# Patient Record
Sex: Male | Born: 1970 | Race: White | Hispanic: No | Marital: Married | Smoking: Former smoker
Health system: Southern US, Community
[De-identification: ages and names within clinical notes are randomized; demographics above are authoritative.]

## PROBLEM LIST (undated history)

## (undated) DIAGNOSIS — K219 Gastro-esophageal reflux disease without esophagitis: Secondary | ICD-10-CM

## (undated) DIAGNOSIS — F419 Anxiety disorder, unspecified: Secondary | ICD-10-CM

## (undated) DIAGNOSIS — F32A Depression, unspecified: Secondary | ICD-10-CM

## (undated) DIAGNOSIS — D649 Anemia, unspecified: Secondary | ICD-10-CM

## (undated) DIAGNOSIS — G709 Myoneural disorder, unspecified: Secondary | ICD-10-CM

## (undated) DIAGNOSIS — H409 Unspecified glaucoma: Secondary | ICD-10-CM

## (undated) DIAGNOSIS — K625 Hemorrhage of anus and rectum: Secondary | ICD-10-CM

## (undated) HISTORY — PX: EYE SURGERY: SHX253

## (undated) HISTORY — PX: ORTHOPEDIC SURGERY: SHX850

## (undated) HISTORY — PX: FRACTURE SURGERY: SHX138

---

## 2015-04-10 ENCOUNTER — Encounter (INDEPENDENT_AMBULATORY_CARE_PROVIDER_SITE_OTHER): Payer: Self-pay

## 2015-04-10 ENCOUNTER — Encounter (INDEPENDENT_AMBULATORY_CARE_PROVIDER_SITE_OTHER): Payer: Self-pay | Admitting: *Deleted

## 2018-03-14 DIAGNOSIS — H21233 Degeneration of iris (pigmentary), bilateral: Secondary | ICD-10-CM | POA: Diagnosis not present

## 2018-03-14 DIAGNOSIS — Z961 Presence of intraocular lens: Secondary | ICD-10-CM | POA: Diagnosis not present

## 2018-03-14 DIAGNOSIS — H401331 Pigmentary glaucoma, bilateral, mild stage: Secondary | ICD-10-CM | POA: Diagnosis not present

## 2018-05-30 DIAGNOSIS — Z0000 Encounter for general adult medical examination without abnormal findings: Secondary | ICD-10-CM | POA: Diagnosis not present

## 2018-05-30 DIAGNOSIS — H8113 Benign paroxysmal vertigo, bilateral: Secondary | ICD-10-CM | POA: Diagnosis not present

## 2018-05-30 DIAGNOSIS — Z6824 Body mass index (BMI) 24.0-24.9, adult: Secondary | ICD-10-CM | POA: Diagnosis not present

## 2018-05-31 ENCOUNTER — Encounter (INDEPENDENT_AMBULATORY_CARE_PROVIDER_SITE_OTHER): Payer: Self-pay | Admitting: *Deleted

## 2018-06-13 DIAGNOSIS — L03119 Cellulitis of unspecified part of limb: Secondary | ICD-10-CM | POA: Diagnosis not present

## 2018-06-13 DIAGNOSIS — Z6824 Body mass index (BMI) 24.0-24.9, adult: Secondary | ICD-10-CM | POA: Diagnosis not present

## 2018-08-02 DIAGNOSIS — H401331 Pigmentary glaucoma, bilateral, mild stage: Secondary | ICD-10-CM | POA: Diagnosis not present

## 2018-08-02 DIAGNOSIS — H21233 Degeneration of iris (pigmentary), bilateral: Secondary | ICD-10-CM | POA: Diagnosis not present

## 2019-07-27 DIAGNOSIS — L509 Urticaria, unspecified: Secondary | ICD-10-CM | POA: Diagnosis not present

## 2019-08-03 DIAGNOSIS — H401121 Primary open-angle glaucoma, left eye, mild stage: Secondary | ICD-10-CM | POA: Diagnosis not present

## 2019-08-03 DIAGNOSIS — H5212 Myopia, left eye: Secondary | ICD-10-CM | POA: Diagnosis not present

## 2019-08-03 DIAGNOSIS — Z9849 Cataract extraction status, unspecified eye: Secondary | ICD-10-CM | POA: Diagnosis not present

## 2019-08-03 DIAGNOSIS — H524 Presbyopia: Secondary | ICD-10-CM | POA: Diagnosis not present

## 2019-08-03 DIAGNOSIS — H52222 Regular astigmatism, left eye: Secondary | ICD-10-CM | POA: Diagnosis not present

## 2019-08-03 DIAGNOSIS — Z961 Presence of intraocular lens: Secondary | ICD-10-CM | POA: Diagnosis not present

## 2019-08-03 DIAGNOSIS — H401111 Primary open-angle glaucoma, right eye, mild stage: Secondary | ICD-10-CM | POA: Diagnosis not present

## 2019-08-03 DIAGNOSIS — H43813 Vitreous degeneration, bilateral: Secondary | ICD-10-CM | POA: Diagnosis not present

## 2019-10-18 DIAGNOSIS — H401331 Pigmentary glaucoma, bilateral, mild stage: Secondary | ICD-10-CM | POA: Diagnosis not present

## 2019-10-18 DIAGNOSIS — H4063X1 Glaucoma secondary to drugs, bilateral, mild stage: Secondary | ICD-10-CM | POA: Diagnosis not present

## 2020-05-10 DIAGNOSIS — Z20828 Contact with and (suspected) exposure to other viral communicable diseases: Secondary | ICD-10-CM | POA: Diagnosis not present

## 2020-07-09 DIAGNOSIS — Z23 Encounter for immunization: Secondary | ICD-10-CM | POA: Diagnosis not present

## 2020-09-26 DIAGNOSIS — H401331 Pigmentary glaucoma, bilateral, mild stage: Secondary | ICD-10-CM | POA: Diagnosis not present

## 2021-03-27 DIAGNOSIS — H524 Presbyopia: Secondary | ICD-10-CM | POA: Diagnosis not present

## 2021-03-27 DIAGNOSIS — H401111 Primary open-angle glaucoma, right eye, mild stage: Secondary | ICD-10-CM | POA: Diagnosis not present

## 2021-09-11 ENCOUNTER — Emergency Department (HOSPITAL_COMMUNITY)
Admission: EM | Admit: 2021-09-11 | Discharge: 2021-09-11 | Disposition: A | Discharge status: Home / Self Care | Payer: 59 | Attending: Emergency Medicine | Admitting: Emergency Medicine

## 2021-09-11 ENCOUNTER — Emergency Department (HOSPITAL_COMMUNITY): Payer: 59

## 2021-09-11 ENCOUNTER — Encounter (HOSPITAL_COMMUNITY): Payer: Self-pay | Admitting: *Deleted

## 2021-09-11 DIAGNOSIS — R202 Paresthesia of skin: Secondary | ICD-10-CM | POA: Diagnosis not present

## 2021-09-11 DIAGNOSIS — R079 Chest pain, unspecified: Secondary | ICD-10-CM | POA: Diagnosis not present

## 2021-09-11 DIAGNOSIS — J189 Pneumonia, unspecified organism: Secondary | ICD-10-CM | POA: Diagnosis not present

## 2021-09-11 DIAGNOSIS — Z20822 Contact with and (suspected) exposure to covid-19: Secondary | ICD-10-CM | POA: Insufficient documentation

## 2021-09-11 DIAGNOSIS — R200 Anesthesia of skin: Secondary | ICD-10-CM | POA: Diagnosis not present

## 2021-09-11 DIAGNOSIS — R0789 Other chest pain: Secondary | ICD-10-CM | POA: Diagnosis present

## 2021-09-11 DIAGNOSIS — R918 Other nonspecific abnormal finding of lung field: Secondary | ICD-10-CM | POA: Diagnosis not present

## 2021-09-11 LAB — BASIC METABOLIC PANEL
Anion gap: 6 (ref 5–15)
BUN: 13 mg/dL (ref 6–20)
CO2: 27 mmol/L (ref 22–32)
Calcium: 8.8 mg/dL — ABNORMAL LOW (ref 8.9–10.3)
Chloride: 103 mmol/L (ref 98–111)
Creatinine, Ser: 1.16 mg/dL (ref 0.61–1.24)
GFR, Estimated: >60 mL/min (ref >60)
Glucose, Bld: 95 mg/dL (ref 70–99)
Potassium: 3.9 mmol/L (ref 3.5–5.1)
Sodium: 136 mmol/L (ref 135–145)

## 2021-09-11 LAB — CBC
HCT: 45.9 % (ref 39.0–52.0)
Hemoglobin: 16.5 g/dL (ref 13.0–17.0)
MCH: 32.6 pg (ref 26.0–34.0)
MCHC: 35.9 g/dL (ref 30.0–36.0)
MCV: 90.7 fL (ref 80.0–100.0)
Platelets: 179 10*3/uL (ref 150–400)
RBC: 5.06 MIL/uL (ref 4.22–5.81)
RDW: 11.7 % (ref 11.5–15.5)
WBC: 4 10*3/uL (ref 4.0–10.5)
nRBC: 0 % (ref 0.0–0.2)

## 2021-09-11 LAB — RESP PANEL BY RT-PCR (FLU A&B, COVID) ARPGX2
Influenza A by PCR: NEGATIVE
Influenza B by PCR: NEGATIVE
SARS Coronavirus 2 by RT PCR: NEGATIVE

## 2021-09-11 LAB — TROPONIN I (HIGH SENSITIVITY): Troponin I (High Sensitivity): <2 ng/L (ref <18)

## 2021-09-11 MED ORDER — ALUM & MAG HYDROXIDE-SIMETH 200-200-20 MG/5ML PO SUSP
30.0000 mL | Freq: Once | ORAL | Status: AC
  Administered 2021-09-11: 30 mL via ORAL
  Filled 2021-09-11: qty 30

## 2021-09-11 MED ORDER — AZITHROMYCIN 250 MG PO TABS: 250.0000 mg | ORAL_TABLET | Freq: Every day | ORAL | 0 refills | Status: DC

## 2021-09-11 MED ORDER — LIDOCAINE VISCOUS HCL 2 % MT SOLN
15.0000 mL | Freq: Once | OROMUCOSAL | Status: AC
  Administered 2021-09-11: 15 mL via ORAL
  Filled 2021-09-11: qty 15

## 2021-09-11 NOTE — Discharge Instructions (Signed)
Take the antibiotics as prescribed.  Follow-up with your primary care doctor to make sure you are improving and consider repeat chest x-ray

## 2021-09-11 NOTE — ED Provider Triage Note (Signed)
Emergency Medicine Provider Triage Evaluation Note  Troy Dillon , a 51 y.o. male  was evaluated in triage.  Pt complains of chest pain.  Symptoms started as an uncomfortable sensation in his chest last night.  Today he started having discomfort on the left side of his chest that is more painful.  Patient does not have a history of heart disease.  He was tried to make an appointment with his doctor but then while he was driving today he started to feel lightheaded and tingling all over.  No fevers or chills.  No cough..  Review of Systems  Positive: Chest pain Negative: Abdominal pain  Physical Exam  BP (!) 124/96    Pulse 65    Temp 97.9 F (36.6 C) (Oral)    Resp 18    SpO2 99%  Gen:   Awake, no distress   Resp:  Normal effort  MSK:   Moves extremities without difficulty  Other:    Medical Decision Making  Medically screening exam initiated at 11:30 AM.  Appropriate orders placed.  Troy Wyman was informed that the remainder of the evaluation will be completed by another provider, this initial triage assessment does not replace that evaluation, and the importance of remaining in the ED until their evaluation is complete.  Initial EKG without signs of acute STEMI.  We will proceed with laboratory testing x-rays for evaluation   Dorie Rank, MD 09/11/21 1132

## 2021-09-11 NOTE — ED Provider Notes (Signed)
[facility] Provider Note   CSN: [id number] Arrival date & time: 09/11/21  1101     History  Chief Complaint  Patient presents with   Chest Pain    Troy Dillon is a 51 y.o. male.   Chest Pain Associated symptoms: no cough and no fever    HPI: A 51 year old patient presents for evaluation of chest pain. Initial onset of pain was more than 6 hours ago. The patient's chest pain is described as heaviness/pressure/tightness and is not worse with exertion. The patient's chest pain is middle- or left-sided, is not well-localized, is not sharp and does not radiate to the arms/jaw/neck. The patient does not complain of nausea and denies diaphoresis. The patient has no history of stroke, has no history of peripheral artery disease, has not smoked in the past 90 days, denies any history of treated diabetes, has no relevant family history of coronary artery disease (first degree relative at less than age 35), is not hypertensive, has no history of hypercholesterolemia and does not have an elevated BMI (>=30).  Patient states he noticed a pressure in his chest that started last night.  He eventually went to sleep but the symptoms were still there this morning when he woke up.  Later this morning he developed the pressure-like discomfort.  He also had an episode of lightheadedness and tingling while he was driving.  He did notice 1 episode of pain increasing when he yawned. Home Medications Prior to Admission medications   Medication Sig Start Date End Date Taking? Authorizing Provider  azithromycin (ZITHROMAX) 250 MG tablet Take 1 tablet (250 mg total) by mouth daily. Take first 2 tablets together, then 1 every day until finished. 09/11/21  Yes Dorie Rank, MD  Multiple Vitamin (MULTIVITAMIN) tablet Take 1 tablet by mouth daily.   Yes [provider]      Allergies    Hydrocodone    Review of Systems   Review of Systems  Constitutional:  Negative for fever.   Respiratory:  Negative for cough.   Cardiovascular:  Positive for chest pain.   Physical Exam Updated Vital Signs BP 117/87    Pulse 61    Temp 97.9 F (36.6 C) (Oral)    Resp 17    SpO2 99%  Physical Exam Vitals and nursing note reviewed.  Constitutional:      General: He is not in acute distress.    Appearance: He is well-developed.  HENT:     Head: Normocephalic and atraumatic.     Right Ear: External ear normal.     Left Ear: External ear normal.  Eyes:     General: No scleral icterus.       Right eye: No discharge.        Left eye: No discharge.     Conjunctiva/sclera: Conjunctivae normal.  Neck:     Trachea: No tracheal deviation.  Cardiovascular:     Rate and Rhythm: Normal rate and regular rhythm.  Pulmonary:     Effort: Pulmonary effort is normal. No respiratory distress.     Breath sounds: Normal breath sounds. No stridor. No wheezing or rales.  Abdominal:     General: Bowel sounds are normal. There is no distension.     Palpations: Abdomen is soft.     Tenderness: There is no abdominal tenderness. There is no guarding or rebound.  Musculoskeletal:        General: No tenderness or deformity.     Cervical back: Neck supple.  Skin:    General: Skin is warm and dry.     Findings: No rash.  Neurological:     General: No focal deficit present.     Mental Status: He is alert.     Cranial Nerves: No cranial nerve deficit (no facial droop, extraocular movements intact, no slurred speech).     Sensory: No sensory deficit.     Motor: No abnormal muscle tone or seizure activity.     Coordination: Coordination normal.  Psychiatric:        Mood and Affect: Mood normal.    ED Results / Procedures / Treatments   Labs (all labs ordered are listed, but only abnormal results are displayed) Labs Reviewed  BASIC METABOLIC PANEL - Abnormal; Notable for the following components:      Result Value   Calcium 8.8 (*)    All other components within normal limits  RESP PANEL  BY RT-PCR (FLU A&B, COVID) ARPGX2  CBC  TROPONIN I (HIGH SENSITIVITY)    EKG EKG Interpretation  Date/Time:  Thursday September 11 2021 11:14:28 EST Ventricular Rate:  63 PR Interval:  130 QRS Duration: 86 QT Interval:  378 QTC Calculation: 386 R Axis:   58 Text Interpretation: Normal sinus rhythm Normal ECG No previous ECGs available Confirmed by Dorie Rank [phone]) on 09/11/2021 11:25:43 AM  Radiology DG Chest 2 View  Result Date: 09/11/2021 CLINICAL DATA:  Chest pain EXAM: CHEST - 2 VIEW COMPARISON:  None. FINDINGS: The heart size and mediastinal contours are within normal limits. Mild, diffuse bilateral interstitial pulmonary opacity. The visualized skeletal structures are unremarkable. IMPRESSION: Mild, diffuse bilateral interstitial pulmonary opacity, suggestive of edema or atypical/viral infection. No focal airspace opacity. Electronically Signed   By: Delanna Ahmadi M.D.   On: 09/11/2021 11:41    Procedures Procedures    Medications Ordered in ED Medications  alum & mag hydroxide-simeth (MAALOX/MYLANTA) 200-200-20 MG/5ML suspension 30 mL (30 mLs Oral Given 09/11/21 1226)    And  lidocaine (XYLOCAINE) 2 % viscous mouth solution 15 mL (15 mLs Oral Given 09/11/21 1226)    ED Course/ Medical Decision Making/ A&P Clinical Course as of 09/11/21 1533  Thu Sep 11, 2021  1218 Troponin is normal.  CBC and metabolic panel are normal. [JK]  1219 DG Chest 2 View Chest x-ray images and radiology findings reviewed.  Possible interstitial viral process [JK]  1515 COVID and flu are negative. [JK]    Clinical Course User Index [JK] Dorie Rank, MD   HEAR Score: 2                       Medical Decision Making Amount and/or Complexity of Data Reviewed Labs: ordered. Radiology: ordered. Decision-making details documented in ED Course.  Risk OTC drugs. Prescription drug management.   Patient presented with complaints of chest pain.  Low risk heart score.  Serial troponins are normal.   I doubt ACS.  Low risk for PE, symptoms not suggestive of PE ED.  No risk factors.  Chest x-ray shows questionable interstitial pneumonia.  Patient has had some symptoms that increased with breathing.  No fever or and patient has not been coughing so pneumonia is not definitive but we will go ahead and treat with a course of antibiotics.  Recommend outpatient follow-up on x-ray.  Tingling and paresthesias earlier most likely related to the hyperventilation.  No signs of acute electrolyte abnormalities.  No hypoglycemia.  Patient has a normal neurologic exam.  Final Clinical Impression(s) / ED Diagnoses Final diagnoses:  Chest pain, unspecified type  Atypical pneumonia    Rx / DC Orders ED Discharge Orders          Ordered    azithromycin (ZITHROMAX) 250 MG tablet  Daily        09/11/21 1531              Dorie Rank, MD 09/11/21 1533

## 2021-09-11 NOTE — ED Triage Notes (Signed)
Chest pain onset last night °

## 2021-09-17 DIAGNOSIS — R42 Dizziness and giddiness: Secondary | ICD-10-CM | POA: Diagnosis not present

## 2021-09-17 DIAGNOSIS — Z6826 Body mass index (BMI) 26.0-26.9, adult: Secondary | ICD-10-CM | POA: Diagnosis not present

## 2021-09-17 DIAGNOSIS — R079 Chest pain, unspecified: Secondary | ICD-10-CM | POA: Diagnosis not present

## 2021-09-30 ENCOUNTER — Ambulatory Visit: Payer: 59 | Admitting: Internal Medicine

## 2021-10-17 DIAGNOSIS — H43813 Vitreous degeneration, bilateral: Secondary | ICD-10-CM | POA: Diagnosis not present

## 2021-10-23 ENCOUNTER — Ambulatory Visit (INDEPENDENT_AMBULATORY_CARE_PROVIDER_SITE_OTHER): Payer: No Typology Code available for payment source | Admitting: Internal Medicine

## 2021-10-23 ENCOUNTER — Encounter: Payer: Self-pay | Admitting: Internal Medicine

## 2021-10-23 ENCOUNTER — Other Ambulatory Visit: Payer: Self-pay

## 2021-10-23 VITALS — BP 120/80 | HR 86 | Ht 71.0 in | Wt 179.8 lb

## 2021-10-23 DIAGNOSIS — R42 Dizziness and giddiness: Secondary | ICD-10-CM

## 2021-10-23 NOTE — Patient Instructions (Signed)

## 2021-10-23 NOTE — Progress Notes (Signed)
?Cardiology Office Note:   ? ?Date:  10/23/2021  ? ?ID:  Troy Dillon, DOB 08-13-1971, MRN [id number] ? ?PCP:  [facility] ?  ?[facility] Providers ?Cardiologist:  Janina Mayo, MD    ? ?Referring MD: Lanelle Bal, PA-C  ? ?No chief complaint on file. ?Dizziness ? ?History of Present Illness:   ? ?Troy Dillon is a 51 y.o. male with no significant past medical history, referral for chest soreness and dizziness ? ?He felt chest soreness over a month ago. He went to work and was busy and became dizzy. He felt like he was going to pass out. He was taking afrin spray at that time and thought maybe he over used it.  He can feel some lightheadedness. He denies syncope.  He was not eating while driving. Now he eats prior to his drives for work and this has improved his symptoms. He denies chest pressure or SOB. TSH is normal. No palpitations. No DM2. Normal blood pressure. He notes floaters, no loss of vision. No signs of stroke. ? ?He denies stress test. No cardiac hx ? ?Family Hx- father MI 58-70. Brother with atrial fibrillation ? ?Social Hx- smoked cigarettes  age [age]; intermittent, quit smoking. ? ?EKG 09/11/2021- normal ekg ? ? ? ?Past Surgical History:  ?Procedure Laterality Date  ? EYE SURGERY    ? ORTHOPEDIC SURGERY    ? ? ?Current Medications: ?Current Meds  ?Medication Sig  ? Multiple Vitamin (MULTIVITAMIN) tablet Take 1 tablet by mouth daily.  ? SIMBRINZA 1-0.2 % SUSP Apply to eye.  ?  ? ?Allergies:   Hydrocodone  ? ?Social History  ? ?Socioeconomic History  ? Marital status: Married  ?  Spouse name: Not on file  ? Number of children: Not on file  ? Years of education: Not on file  ? Highest education level: Not on file  ?Occupational History  ? Not on file  ?Tobacco Use  ? Smoking status: Former  ?  Types: Cigarettes  ? Smokeless tobacco: Never  ?Vaping Use  ? Vaping Use: Never used  ?Substance and Sexual Activity  ? Alcohol use: Yes  ? Drug use: Never  ? Sexual activity: Not on file  ?Other  Topics Concern  ? Not on file  ?Social History Narrative  ? Not on file  ? ?Social Determinants of Health  ? ?Financial Resource Strain: Not on file  ?Food Insecurity: Not on file  ?Transportation Needs: Not on file  ?Physical Activity: Not on file  ?Stress: Not on file  ?Social Connections: Not on file  ?  ? ?Family History: ?The patient's per above ? ?ROS:   ?Please see the history of present illness.    ? All other systems reviewed and are negative. ? ?EKGs/Labs/Other Studies Reviewed:   ? ?The following studies were reviewed today: ? ? ?EKG:  EKG is  ordered today.  The ekg ordered today demonstrates  ? ?NSR ? ?Recent Labs: ?09/11/2021: BUN 13; Creatinine, Ser 1.16; Hemoglobin 16.5; Platelets 179; Potassium 3.9; Sodium 136  ?Recent Lipid Panel ?No results found for: CHOL, TRIG, HDL, CHOLHDL, VLDL, LDLCALC, LDLDIRECT ? ? ?Risk Assessment/Calculations:   ?  ? ?    ? ?Physical Exam:   ? ?VS:  ? ?Vitals:  ? 10/23/21 0841  ?BP: 120/80  ?Pulse: 86  ?SpO2: 96%  ? ? ? ?Wt Readings from Last 3 Encounters:  ?10/23/21 179 lb 12.8 oz (81.6 kg)  ?  ? ?GEN:  Well nourished, well developed in  no acute distress ?HEENT: Normal ?NECK: No JVD; No carotid bruits ?LYMPHATICS: No lymphadenopathy ?CARDIAC: RRR, no murmurs, rubs, gallops ?RESPIRATORY:  Clear to auscultation without rales, wheezing or rhonchi  ?ABDOMEN: Soft, non-tender, non-distended ?MUSCULOSKELETAL:  No edema; No deformity  ?SKIN: Warm and dry ?NEUROLOGIC:  Alert and oriented x 3 ?PSYCHIATRIC:  Normal affect  ? ?ASSESSMENT:   ? ?#Dehydration: His dizziness is associated with not eating as much or drinking fluids. We discussed this. He has no signs on his Ekg that are concerning for arrhythmia. He had no stroke symptoms.  Recommend to continue with lifestyle modification and CVD risk mitigation (yearly A1c, lipid monitoring).  Considering his management moving forward is preventative, he does not need to follow with cardiology. If new symptoms were to arise, he can  follow-up as needed ? ?PLAN:   ? ?In order of problems listed above: ? ?Follow up PRN ? ?   ? ?   ? ? ?Medication Adjustments/Labs and Tests Ordered: ?Current medicines are reviewed at length with the patient today.  Concerns regarding medicines are outlined above.  ?No orders of the defined types were placed in this encounter. ? ?No orders of the defined types were placed in this encounter. ? ? ?Patient Instructions  ?Medication Instructions:  ?No Changes In Medications at this time.  ?*If you need a refill on your cardiac medications before your next appointment, please call your pharmacy* ? ?Follow-Up: ?At [facility], you and your health needs are our priority.  As part of our continuing mission to provide you with exceptional heart care, we have created designated Provider Care Teams.  These Care Teams include your primary Cardiologist (physician) and Advanced Practice Providers (APPs -  Physician Assistants and Nurse Practitioners) who all work together to provide you with the care you need, when you need it. ? ?Your next appointment:   ?AS NEEDED  ? ?The format for your next appointment:   ?In Person ? ?Provider:   ?Janina Mayo, MD    ? ?Signed, ?Janina Mayo, MD  ?10/23/2021 9:39 AM    ?[place] ?

## 2021-11-13 DIAGNOSIS — H401111 Primary open-angle glaucoma, right eye, mild stage: Secondary | ICD-10-CM | POA: Diagnosis not present

## 2022-01-21 DIAGNOSIS — T7840XA Allergy, unspecified, initial encounter: Secondary | ICD-10-CM | POA: Diagnosis not present

## 2022-01-21 DIAGNOSIS — Z87891 Personal history of nicotine dependence: Secondary | ICD-10-CM | POA: Diagnosis not present

## 2022-01-21 DIAGNOSIS — R21 Rash and other nonspecific skin eruption: Secondary | ICD-10-CM | POA: Diagnosis not present

## 2022-01-21 DIAGNOSIS — Z885 Allergy status to narcotic agent status: Secondary | ICD-10-CM | POA: Diagnosis not present

## 2022-02-04 IMAGING — DX DG CHEST 2V
2 series · 2 of 2 positions shown · non-contrast
Comparison: None.

CLINICAL DATA: Chest pain

EXAM:
CHEST - 2 VIEW

[chest pa]
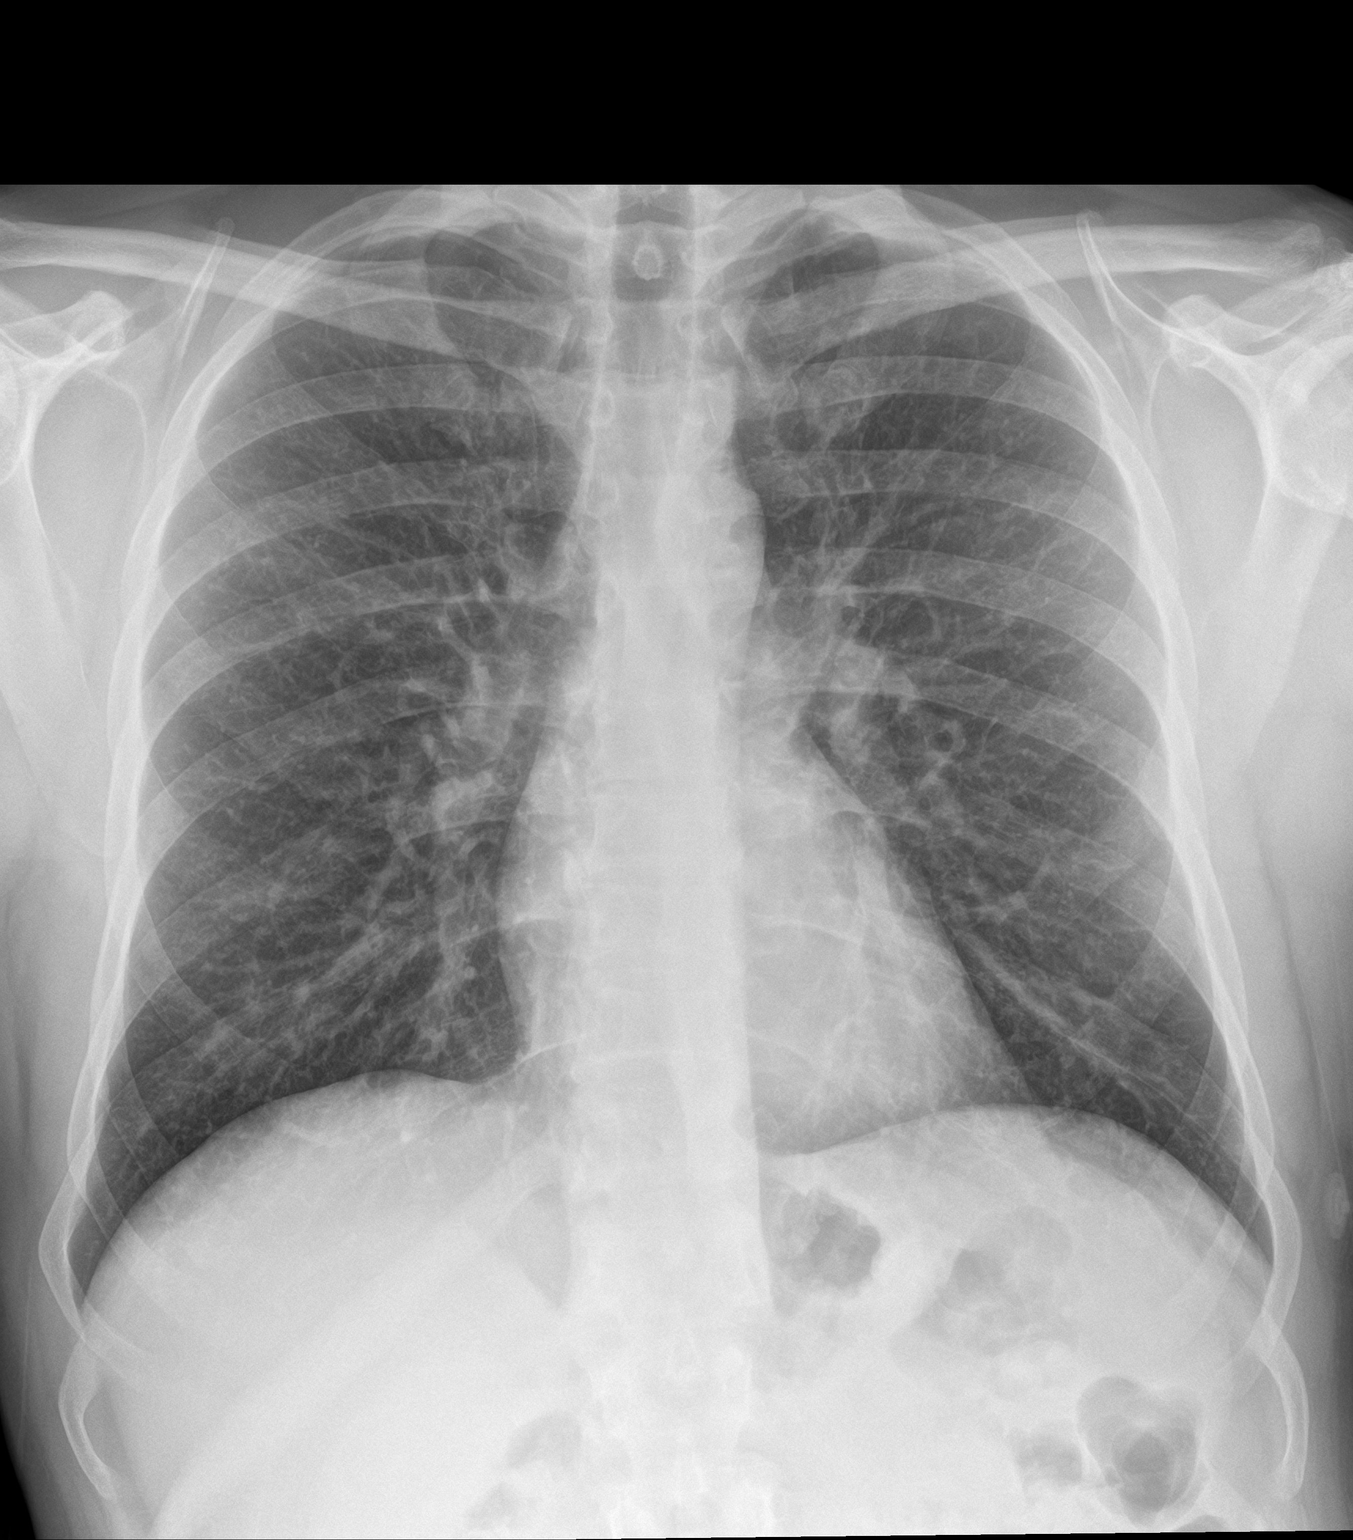

[chest lat]
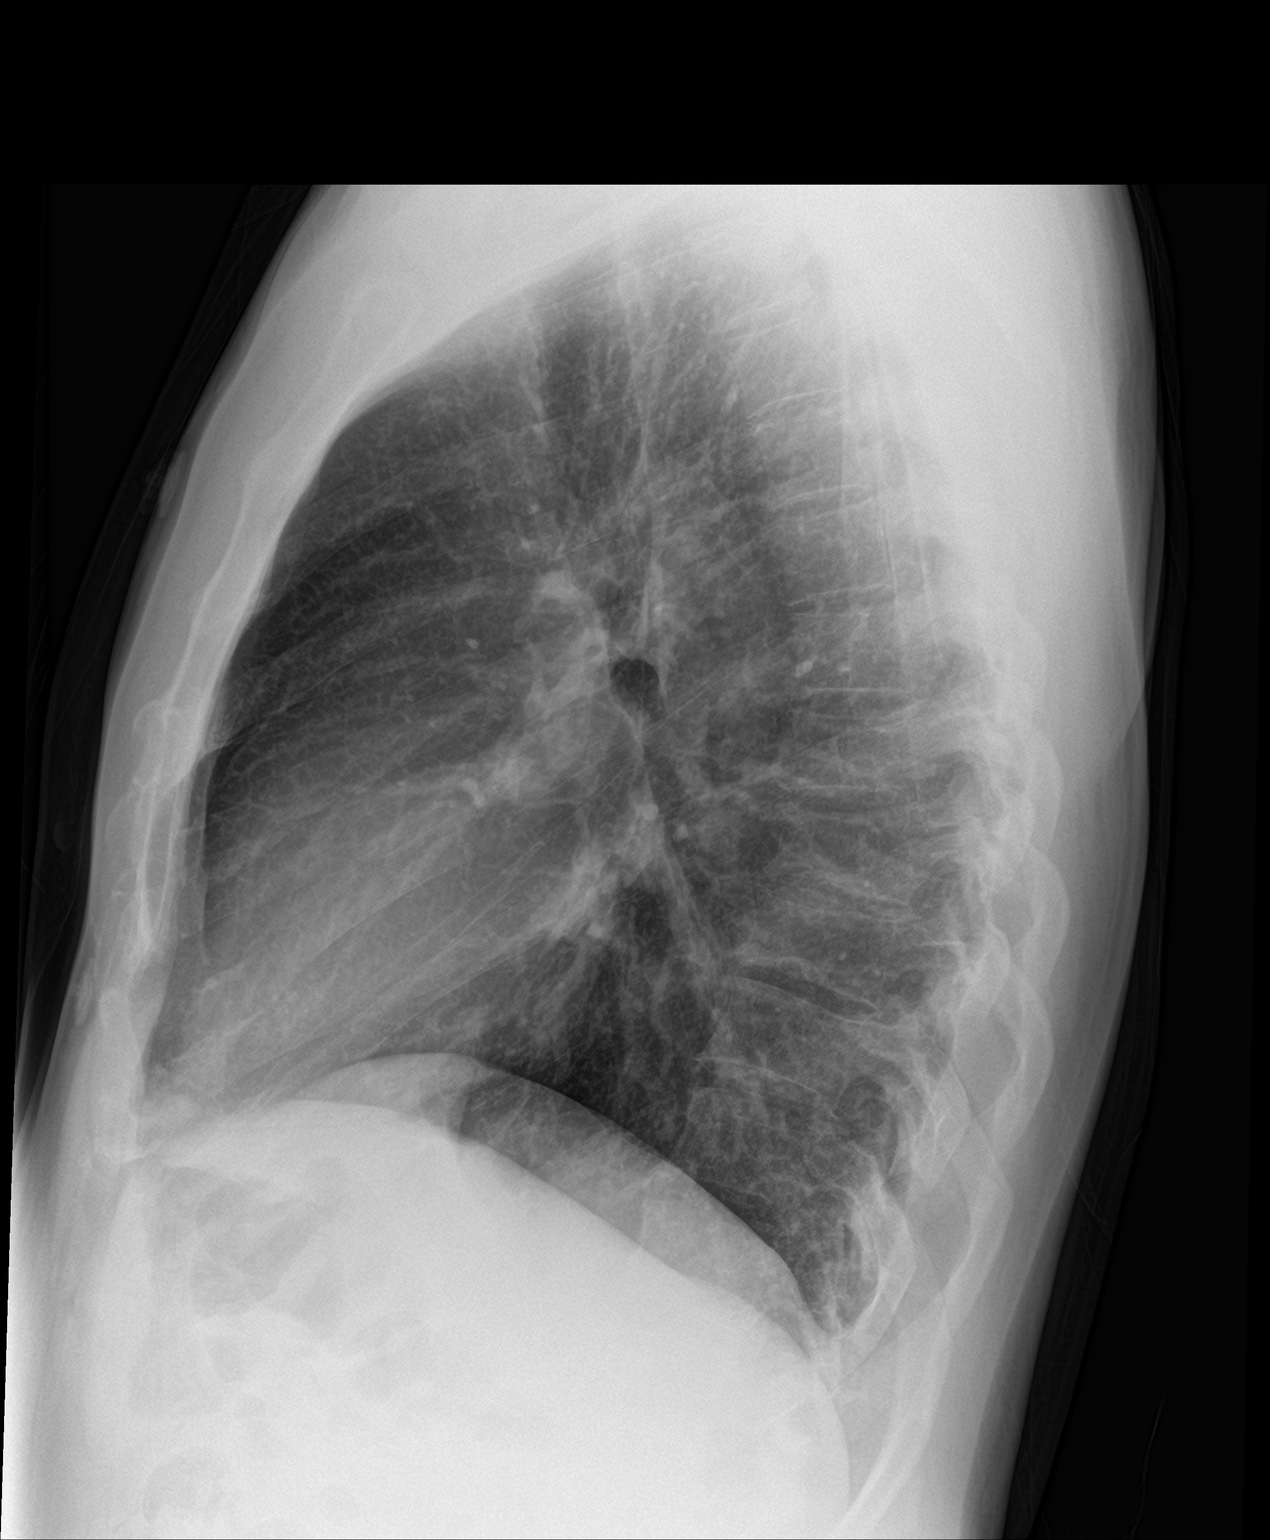

[2 of 2 positions shown; findings below may reference images not displayed]

FINDINGS: The heart size and mediastinal contours are within normal limits.
Mild, diffuse bilateral interstitial pulmonary opacity. The
visualized skeletal structures are unremarkable.
IMPRESSION: Mild, diffuse bilateral interstitial pulmonary opacity, suggestive
of edema or atypical/viral infection. No focal airspace opacity.

## 2022-04-15 DIAGNOSIS — Z0000 Encounter for general adult medical examination without abnormal findings: Secondary | ICD-10-CM | POA: Diagnosis not present

## 2022-04-15 DIAGNOSIS — Z13220 Encounter for screening for lipoid disorders: Secondary | ICD-10-CM | POA: Diagnosis not present

## 2022-04-15 DIAGNOSIS — Z1329 Encounter for screening for other suspected endocrine disorder: Secondary | ICD-10-CM | POA: Diagnosis not present

## 2022-04-15 DIAGNOSIS — Z125 Encounter for screening for malignant neoplasm of prostate: Secondary | ICD-10-CM | POA: Diagnosis not present

## 2022-05-13 DIAGNOSIS — K625 Hemorrhage of anus and rectum: Secondary | ICD-10-CM | POA: Diagnosis not present

## 2022-06-08 DIAGNOSIS — D125 Benign neoplasm of sigmoid colon: Secondary | ICD-10-CM | POA: Diagnosis not present

## 2022-06-08 DIAGNOSIS — Z20822 Contact with and (suspected) exposure to covid-19: Secondary | ICD-10-CM | POA: Diagnosis not present

## 2022-06-08 DIAGNOSIS — K6389 Other specified diseases of intestine: Secondary | ICD-10-CM | POA: Diagnosis not present

## 2022-06-08 DIAGNOSIS — C20 Malignant neoplasm of rectum: Secondary | ICD-10-CM | POA: Diagnosis not present

## 2022-06-08 DIAGNOSIS — D120 Benign neoplasm of cecum: Secondary | ICD-10-CM | POA: Diagnosis not present

## 2022-06-08 DIAGNOSIS — D126 Benign neoplasm of colon, unspecified: Secondary | ICD-10-CM | POA: Diagnosis not present

## 2022-06-08 DIAGNOSIS — K625 Hemorrhage of anus and rectum: Secondary | ICD-10-CM | POA: Diagnosis not present

## 2022-06-08 DIAGNOSIS — R509 Fever, unspecified: Secondary | ICD-10-CM | POA: Diagnosis not present

## 2022-06-08 DIAGNOSIS — Z800 Family history of malignant neoplasm of digestive organs: Secondary | ICD-10-CM | POA: Diagnosis not present

## 2022-06-08 HISTORY — PX: COLONOSCOPY: SHX174

## 2022-06-08 HISTORY — DX: Malignant neoplasm of rectum: C20

## 2022-06-10 DIAGNOSIS — C218 Malignant neoplasm of overlapping sites of rectum, anus and anal canal: Secondary | ICD-10-CM | POA: Diagnosis not present

## 2022-06-10 DIAGNOSIS — N62 Hypertrophy of breast: Secondary | ICD-10-CM | POA: Diagnosis not present

## 2022-06-10 DIAGNOSIS — I2510 Atherosclerotic heart disease of native coronary artery without angina pectoris: Secondary | ICD-10-CM | POA: Diagnosis not present

## 2022-06-10 DIAGNOSIS — D492 Neoplasm of unspecified behavior of bone, soft tissue, and skin: Secondary | ICD-10-CM | POA: Diagnosis not present

## 2022-06-10 DIAGNOSIS — K6289 Other specified diseases of anus and rectum: Secondary | ICD-10-CM | POA: Diagnosis not present

## 2022-06-10 DIAGNOSIS — R590 Localized enlarged lymph nodes: Secondary | ICD-10-CM | POA: Diagnosis not present

## 2022-06-10 DIAGNOSIS — C20 Malignant neoplasm of rectum: Secondary | ICD-10-CM | POA: Diagnosis not present

## 2022-06-15 DIAGNOSIS — C20 Malignant neoplasm of rectum: Secondary | ICD-10-CM | POA: Diagnosis not present

## 2022-06-16 ENCOUNTER — Other Ambulatory Visit: Payer: Self-pay | Admitting: Radiation Oncology

## 2022-06-16 ENCOUNTER — Ambulatory Visit
Admission: RE | Admit: 2022-06-16 | Discharge: 2022-06-16 | Disposition: A | Discharge status: Home / Self Care | Payer: Self-pay | Source: Ambulatory Visit | Attending: Radiation Oncology | Admitting: Radiation Oncology

## 2022-06-16 DIAGNOSIS — C20 Malignant neoplasm of rectum: Secondary | ICD-10-CM

## 2022-06-16 NOTE — Progress Notes (Signed)
GI Location of Tumor / Histology: Rectal Adenocarcinoma  Troy Dillon presented with rectal bleeding.  MRI Pelvis 06/10/2022: Rectal adenocarcinoma T stage: T3CN1. Tumor extends into the sigmoid from the high rectum.  Distance from tumor to the internal anal sphincter is approximately 7-11 cm.   CT CAP 06/10/2022: Circumferential wall thickening of the upper/mid rectum may reflect patient's known rectal mass.  Mildly enlarged high perirectal lymph nodes measure up to 6 mm in short axis, nonspecific but suspicious for locoregional nodal  disease.  No evidence of distant metastatic disease within the chest, abdomen or pelvis.   Colonoscopy 06/08/2022   Biopsies of Colon Mass 06/08/2022    Past/Anticipated interventions by surgeon, if any:  Dr. Fabienne Bruns 06/15/2022 -Refer to medical oncology and radiation oncology for neoadjuvant treatment as well as further staging and genetic testing. -Patient will more than likely require placement of a PowerPort. The procedure as well as risks and benefits were explained to the patient today. We will be available to place the PowerPort as soon as needed. -We will continue to follow along with the course of medical treatment. We will be available for surgical resection at conclusion of medical therapy.  -Follow-up in 8 weeks    Past/Anticipated interventions by medical oncology, if any:    Weight changes, if any:   Bowel/Bladder complaints, if any:   Nausea / Vomiting, if any:   Pain issues, if any:    Any blood per rectum:     SAFETY ISSUES: Prior radiation?  Pacemaker/ICD?  Possible current pregnancy? N/a Is the patient on methotrexate?   Current Complaints/Details:

## 2022-06-17 ENCOUNTER — Ambulatory Visit
Admission: RE | Admit: 2022-06-17 | Discharge: 2022-06-17 | Disposition: A | Discharge status: Home / Self Care | Payer: No Typology Code available for payment source | Source: Ambulatory Visit | Attending: Radiation Oncology | Admitting: Radiation Oncology

## 2022-06-17 ENCOUNTER — Other Ambulatory Visit: Payer: Self-pay | Admitting: Radiation Oncology

## 2022-06-17 ENCOUNTER — Other Ambulatory Visit: Payer: Self-pay

## 2022-06-17 ENCOUNTER — Encounter: Payer: Self-pay | Admitting: Radiation Oncology

## 2022-06-17 VITALS — BP 105/67 | HR 78 | Temp 97.7°F | Resp 18 | Ht 69.0 in | Wt 177.4 lb

## 2022-06-17 DIAGNOSIS — Z800 Family history of malignant neoplasm of digestive organs: Secondary | ICD-10-CM | POA: Insufficient documentation

## 2022-06-17 DIAGNOSIS — Z87891 Personal history of nicotine dependence: Secondary | ICD-10-CM | POA: Diagnosis not present

## 2022-06-17 DIAGNOSIS — C20 Malignant neoplasm of rectum: Secondary | ICD-10-CM

## 2022-06-17 DIAGNOSIS — Z86010 Personal history of colonic polyps: Secondary | ICD-10-CM | POA: Diagnosis not present

## 2022-06-17 DIAGNOSIS — Z809 Family history of malignant neoplasm, unspecified: Secondary | ICD-10-CM | POA: Diagnosis not present

## 2022-06-17 HISTORY — DX: Hemorrhage of anus and rectum: K62.5

## 2022-06-17 NOTE — Progress Notes (Signed)
Radiation Oncology         [phone] ________________________________  Name: Troy Dillon        MRN: [id number]  Date of Service: 06/17/2022 DOB: 1971/07/12  [id number]  Cathey, Loma Sousa, MD     REFERRING PHYSICIAN: Kerry Kass, MD   DIAGNOSIS: The encounter diagnosis was Rectal carcinoma ([place]).   HISTORY OF PRESENT ILLNESS: Troy Dillon is a 51 y.o. male seen at the request of Dr. Ladona Horns at [facility] with a recently diagnosed rectal carcinoma. HE presented with rectal bleeding and a colonoscopy on 06/08/22 showed a mass in the proximal rectum that was biopsied as well as multiple polyps. His polyps were consistent with tubular adenomas, and the mass in the rectum showed an adenocarcinoma. He underwent staging CT on 06/10/22 of the CAP that was consistent with thickening of the rectum, high perirectal nodes felt to be nonspecific, and no metastatic disease was noted. An MRI pelvis for staging on 06/10/22 showed a cT3N1 lesion extending into the high rectum, and was noted 7-11 cm from the internal anal sphincter. He's seen today to discuss neoadjuvant treatments. He will meet with Dr. Delton Coombes in [place] tomorrow.     PREVIOUS RADIATION THERAPY: No   PAST MEDICAL HISTORY:  Past Medical History:  Diagnosis Date   Rectal bleeding    Rectal cancer ([place]) 06/08/2022       PAST SURGICAL HISTORY: Past Surgical History:  Procedure Laterality Date   COLONOSCOPY  06/08/2022   EYE SURGERY     FRACTURE SURGERY     Right Femur- Cables in bone remain in place, rod removed in 2000   ORTHOPEDIC SURGERY       FAMILY HISTORY:  Family History  Problem Relation Age of Onset   Cancer Mother        GYN malignancy details unknown   Colon cancer Father    Heart attack Father    Colon cancer Paternal Aunt    Colon cancer Paternal Grandmother    Colon cancer Paternal Grandfather      SOCIAL HISTORY:  reports that he quit smoking about 20 years ago. His  smoking use included cigarettes. His smokeless tobacco use includes snuff. He reports current alcohol use. He reports that he does not use drugs. The patient is married and lives in [place], [place]. He works for a [employer], clinical (histocompatibility and immunogenetics).   ALLERGIES: Hydrocodone   MEDICATIONS:  Current Outpatient Medications  Medication Sig Dispense Refill   Multiple Vitamin (MULTIVITAMIN) tablet Take 1 tablet by mouth daily.     No current facility-administered medications for this encounter.     REVIEW OF SYSTEMS: On review of systems, the patient reports that he is doing well. He has bowel frequency, blood per rectum occasionally with wiping, and some sharp pains after passing stool. He denies any pelvic pain,   nausea or vomiting, or bladder dysfunction. No unintended weight loss has been noted. No other complaints are verbalized.      PHYSICAL EXAM:  Wt Readings from Last 3 Encounters:  06/17/22 177 lb 6.4 oz (80.5 kg)  10/23/21 179 lb 12.8 oz (81.6 kg)   Temp Readings from Last 3 Encounters:  06/17/22 97.7 F (36.5 C)  09/11/21 98 F (36.7 C) (Oral)   BP Readings from Last 3 Encounters:  06/17/22 105/67  10/23/21 120/80  09/11/21 116/82   Pulse Readings from Last 3 Encounters:  06/17/22 78  10/23/21 86  09/11/21 60   Pain Assessment Pain Score: 0-No pain/10  In general this is a well appearing caucasian male in no acute distress. He's alert and oriented x4 and appropriate throughout the examination. Cardiopulmonary assessment is negative for acute distress and he exhibits normal effort.     ECOG = 1  0 - Asymptomatic (Fully active, able to carry on all predisease activities without restriction)  1 - Symptomatic but completely ambulatory (Restricted in physically strenuous activity but ambulatory and able to carry out work of a light or sedentary nature. For example, light housework, office work)  2 - Symptomatic, <50% in bed during the day (Ambulatory and capable of all  self care but unable to carry out any work activities. Up and about more than 50% of waking hours)  3 - Symptomatic, >50% in bed, but not bedbound (Capable of only limited self-care, confined to bed or chair 50% or more of waking hours)  4 - Bedbound (Completely disabled. Cannot carry on any self-care. Totally confined to bed or chair)  5 - Death   Eustace Pen MM, Creech RH, Tormey DC, et al. [phone]). "Toxicity and response criteria of the [facility]". [place] Oncol. 5 (6): 649-55    LABORATORY DATA:  Lab Results  Component Value Date   WBC 4.0 09/11/2021   HGB 16.5 09/11/2021   HCT 45.9 09/11/2021   MCV 90.7 09/11/2021   PLT 179 09/11/2021   Lab Results  Component Value Date   NA 136 09/11/2021   K 3.9 09/11/2021   CL 103 09/11/2021   CO2 27 09/11/2021   No results found for: "ALT", "AST", "GGT", "ALKPHOS", "BILITOT"    RADIOGRAPHY: No results found.     IMPRESSION/PLAN: 1. Stage IIIB, cT3N1M0, adenocarcinoma of the proximal rectum. Dr. Lisbeth Renshaw discusses the pathology findings and reviews the nature of rectal carcinoma. With his findings, it is expected that he would be best suited for total neoadjuvant chemotherapy, followed by chemoradiation, then subsequent surgical resection with Dr. Ladona Horns at [facility].  We discussed the risks, benefits, short, and long term effects of radiotherapy, as well as the curative intent, and the patient is interested in proceeding. Dr. Lisbeth Renshaw discusses the delivery and logistics of radiotherapy and anticipates a course of 5 1/2 weeks of radiotherapy. We will see him back in about 3 1/2 to 4 months following total neoadjuvant chemotherapy and at that time will review chemoRT. He is also interested in referral to colorectal surgery within the [place]. A new referral will be placed.  2. 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 We discussed the importance of evaluation with physical therapy prior to pelvic radiation.  A referral was placed to physical therapy today.  3. Possible genetic predisposition to malignancy. The patient is a candidate for genetic testing given his personal and family history. He was offered referral and was interested in referral. A new referral was placed to take place either at [facility], or here at [facility].    In a visit lasting 60 minutes, greater than 50% of the time was spent face to face discussing the patient's condition, in preparation for the discussion, and coordinating the patient's care.   The above documentation reflects my direct findings during this shared patient visit. Please see the separate note by Dr. Lisbeth Renshaw on this date for the remainder of the patient's plan of care.    Carola Rhine, [facility]   **Disclaimer: This note was dictated with voice recognition software. Similar sounding words can inadvertently be transcribed  and this note may contain transcription errors which may not have been corrected upon publication of note.**

## 2022-06-17 NOTE — Progress Notes (Signed)
Pt mentioned desire to meet nutrition today when discussing with Dr. Lisbeth Renshaw. New orders were placed.

## 2022-06-18 ENCOUNTER — Other Ambulatory Visit: Payer: Self-pay

## 2022-06-18 ENCOUNTER — Encounter: Payer: Self-pay | Admitting: Hematology

## 2022-06-18 ENCOUNTER — Other Ambulatory Visit: Payer: Self-pay | Admitting: Radiology

## 2022-06-18 ENCOUNTER — Inpatient Hospital Stay: Payer: No Typology Code available for payment source | Attending: Hematology | Admitting: Hematology

## 2022-06-18 VITALS — BP 117/73 | HR 77 | Temp 96.5°F | Resp 16 | Ht 70.0 in | Wt 175.9 lb

## 2022-06-18 DIAGNOSIS — Z720 Tobacco use: Secondary | ICD-10-CM | POA: Diagnosis not present

## 2022-06-18 DIAGNOSIS — Z800 Family history of malignant neoplasm of digestive organs: Secondary | ICD-10-CM

## 2022-06-18 DIAGNOSIS — Z808 Family history of malignant neoplasm of other organs or systems: Secondary | ICD-10-CM | POA: Diagnosis not present

## 2022-06-18 DIAGNOSIS — C20 Malignant neoplasm of rectum: Secondary | ICD-10-CM | POA: Diagnosis not present

## 2022-06-18 NOTE — Progress Notes (Signed)
[facility] NOTE  Patient Care Team: Practice, [facility] as PCP - General Branch, Royetta Crochet, MD as PCP - Cardiology (Cardiology) Derek Jack, MD as Medical Oncologist (Medical Oncology) Brien Mates, RN as Oncology Nurse Navigator (Medical Oncology)  CHIEF COMPLAINTS/PURPOSE OF CONSULTATION:  Newly diagnosed stage III rectal cancer.  HISTORY OF PRESENTING ILLNESS:  Troy Dillon 51 y.o. male is seen in evaluation at the request of Dr. Ladona Horns for newly diagnosed rectal cancer.  Patient was experiencing recent rectal bleeding and underwent colonoscopy on 06/08/2022 which showed fungating partially obstructing large mass found in the rectum 11 cm from anal verge.  Biopsy was consistent with adenocarcinoma.  CEA level was 2.9.  He underwent MRI of the pelvis which showed T3c with tumor extending into the sigmoid from the high rectum, extension through muscularis propria.  Single high mesorectal/superior rectal lymph node measuring 9 mm was seen.  CT CAP was negative for any distant metastatic disease.  He reports that he had first colonoscopy at age 41 secondary to bleeding and strong family history.  He is accompanied by his wife Lattie Haw today.  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  Denies any exposure to chemicals.  Does not smoke cigarettes but dips tobacco.  MEDICAL HISTORY:  Past Medical History:  Diagnosis Date   Rectal bleeding    Rectal cancer ([place]) 06/08/2022    SURGICAL HISTORY: Past Surgical History:  Procedure Laterality Date   COLONOSCOPY  06/08/2022   EYE SURGERY     FRACTURE SURGERY     Right Femur- Cables in bone remain in place, rod removed in 2000   ORTHOPEDIC SURGERY      SOCIAL HISTORY: Social History   Socioeconomic History   Marital status: Married    Spouse name: Not on file   Number of children: Not on file   Years of education: Not on file   Highest education level: Not on file  Occupational History   Not on  file  Tobacco Use   Smoking status: Former    Types: Cigarettes    Quit date: 2003    Years since quitting: 20.8   Smokeless tobacco: Current    Types: Snuff   Tobacco comments:    Some Dipping  Vaping Use   Vaping Use: Never used  Substance and Sexual Activity   Alcohol use: Yes   Drug use: Never   Sexual activity: Not on file  Other Topics Concern   Not on file  Social History Narrative   Not on file   Social Determinants of Health   Financial Resource Strain: Not on file  Food Insecurity: Not on file  Transportation Needs: Not on file  Physical Activity: Not on file  Stress: Not on file  Social Connections: Not on file  Intimate Partner Violence: Not on file    FAMILY HISTORY: Family History  Problem Relation Age of Onset   Cancer Mother        GYN malignancy details unknown   Colon cancer Father    Heart attack Father    Colon cancer Paternal Aunt    Colon cancer Paternal Grandmother    Colon cancer Paternal Grandfather     ALLERGIES:  is allergic to hydrocodone and latex.  MEDICATIONS:  Current Outpatient Medications  Medication Sig Dispense Refill   Multiple Vitamin (MULTIVITAMIN) tablet Take 1 tablet by mouth daily.     ibuprofen (ADVIL) 200 MG tablet Take 200 mg by mouth every 6 (  six) hours as needed for moderate pain.     Salicylic Acid, Acne, (SALICYLIC ACID EX) Apply 1 Application topically daily as needed (acne).     sodium chloride (OCEAN) 0.65 % SOLN nasal spray Place 1 spray into both nostrils as needed for congestion.     No current facility-administered medications for this visit.    REVIEW OF SYSTEMS:   Constitutional: Denies fevers, chills or abnormal night sweats Eyes: Denies blurriness of vision, double vision or watery eyes Ears, nose, mouth, throat, and face: Denies mucositis or sore throat Respiratory: Denies cough, dyspnea or wheezes Cardiovascular: Denies palpitation, chest discomfort or lower extremity  swelling Gastrointestinal:  Denies nausea, heartburn or change in bowel habits Skin: Denies abnormal skin rashes Lymphatics: Denies new lymphadenopathy or easy bruising Neurological:Denies numbness, tingling or new weaknesses Behavioral/Psych: Mood is stable, no new changes  All other systems were reviewed with the patient and are negative.  PHYSICAL EXAMINATION: ECOG PERFORMANCE STATUS: 0 - Asymptomatic  Vitals:   06/18/22 0819  BP: 117/73  Pulse: 77  Resp: 16  Temp: (!) 96.5 F (35.8 C)  SpO2: 97%   Filed Weights   06/18/22 0819  Weight: 175 lb 14.4 oz (79.8 kg)    GENERAL:alert, no distress and comfortable SKIN: skin color, texture, turgor are normal, no rashes or significant lesions EYES: normal, conjunctiva are pink and non-injected, sclera clear OROPHARYNX:no exudate, no erythema and lips, buccal mucosa, and tongue normal  NECK: supple, thyroid normal size, non-tender, without nodularity LYMPH:  no palpable lymphadenopathy in the cervical, axillary or inguinal LUNGS: clear to auscultation and percussion with normal breathing effort HEART: regular rate & rhythm and no murmurs and no lower extremity edema ABDOMEN:abdomen soft, non-tender and normal bowel sounds Musculoskeletal:no cyanosis of digits and no clubbing  PSYCH: alert & oriented x 3 with fluent speech NEURO: no focal motor/sensory deficits  LABORATORY DATA:  I have reviewed the data as listed Lab Results  Component Value Date   WBC 4.0 09/11/2021   HGB 16.5 09/11/2021   HCT 45.9 09/11/2021   MCV 90.7 09/11/2021   PLT 179 09/11/2021     Chemistry      Component Value Date/Time   NA 136 09/11/2021 1128   K 3.9 09/11/2021 1128   CL 103 09/11/2021 1128   CO2 27 09/11/2021 1128   BUN 13 09/11/2021 1128   CREATININE 1.16 09/11/2021 1128      Component Value Date/Time   CALCIUM 8.8 (L) 09/11/2021 1128       RADIOGRAPHIC STUDIES: I have personally reviewed the radiological images as listed and  agreed with the findings in the report. No results found.  ASSESSMENT:  1.  Stage IIIB (T3cN1) rectal adenocarcinoma: - Presentation with rectal bleeding - Colonoscopy (06/08/2022): Fungating partially obstructing large mass found in the rectum 11 cm from anal verge.  Partially circumferential involving one half of the lumen.  Mass measured 3 cm in length.  Diameter 2 cm.  Oozing present.  1 semipedunculated polyp (40 cm from anus) and 3 sessile polyps in the cecum removed. - Pathology (06/08/2022): Rectal mass 11 cm from anal verge-adenocarcinoma - CEA (06/08/2022): 2.9 - MRI pelvis (06/10/2022): T3CN1, tumor extends into sigmoid from the high rectum, extension through muscularis propria, approximately 6 mm beyond the rectal wall.  Tumor begins in the highly portion of the rectum and extends into the sigmoid colon.  Extramural vascular invasion/tumor thrombus along the right lateral margin.  Shortest distance of tumor from mesorectal fascia: 15 mm.  Single high mesorectal or superior rectal lymph node measuring 9 mm.  Distance from tumor to internal anal sphincter is approximately 7-11 cm. - CT CAP (06/10/2022): Circumferential wall thickening of the upper/mid rectum, mildly enlarged high perirectal lymph nodes measuring up to 6 mm in short axis.  No evidence of distant metastatic disease in the chest, abdomen or pelvis.  2.  Social/family history: - He lives at home with his wife Lattie Haw.  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  Denies any chemical exposure.  Does not smoke cigarettes but dips tobacco. - Father had colon cancer at age 54. - Paternal grandmother died of colon cancer. - Paternal aunt had colon cancer. - Mother had "male cancer" and underwent hysterectomy.  PLAN:  1.  Stage IIIB (T3cN1) rectal adenocarcinoma: - We have reviewed pathology report in detail.  We have also reviewed imaging findings. - We discussed NCCN guidelines for T3N1 rectal cancer (assuming MMR  preserved) recommending total neoadjuvant therapy.  He will receive 4 months of FOLFOX chemotherapy followed by long course chemo/RT with capecitabine.  He has already met with Dr. Lisbeth Renshaw. - I have recommended port placement. - He reports that he had previously glaucoma surgery and cataract surgery.  His ophthalmologist has told him to avoid steroids.  We will reach out to Dr. Lanell Matar in [place] to discuss further. - We discussed the chemotherapy regimen with FOLFOX schedule and adverse effects in detail. - I have sent request or pathology for MSI testing. - If he has a deficient MMR/MSI-high, we have also discussed possibility of checkpoint inhibitor therapy with nivolumab/pembrolizumab/dostarlimab with reevaluation of disease status every 2 to 3 months.  If complete clinical response obtained he can be followed with surveillance.  If he has persistent disease at 6 months, he may be offered chemoradiation therapy followed by transabdominal resection or surveillance if complete clinical response obtained. - I will recommend genetic testing. - RTC after port placement to initiate therapy.   Orders Placed This Encounter  Procedures   IR IMAGING GUIDED PORT INSERTION    Standing Status:   Future    Standing Expiration Date:   06/19/2023    Order Specific Question:   Reason for Exam (SYMPTOM  OR DIAGNOSIS REQUIRED)    Answer:   rectal cancer, chemotherapy administartion    Order Specific Question:   Preferred Imaging Location?    Answer:   [facility]    Order Specific Question:   Release to patient    Answer:   Immediate    All questions were answered. The patient knows to call the clinic with any problems, questions or concerns.      Derek Jack, MD 06/18/2022 5:58 PM

## 2022-06-18 NOTE — Patient Instructions (Addendum)
[place]  Discharge Instructions  You were seen and examined today by Dr. Delton Coombes. Dr. Delton Coombes is a medical oncologist, meaning that he specializes in the treatment of cancer diagnoses. Dr. Delton Coombes discussed your past medical history, family history of cancers, and the events that led to you being here today.  You were referred to Dr. Delton Coombes due to a new diagnosis of rectal cancer. You have been diagnosed with Stage III Rectal Cancer. It has spread to one lymph node near the rectum but to no other organs.  The typical treatment for rectal cancer is 4 months (8 treatments) of chemotherapy (known as FOLFOX), followed by 5 to 6 weeks of daily radiation (Monday thru Friday) with oral chemotherapy to be taken at home, followed by surgery. The goal of this treatment is to cure the cancer.  The most common side effects of chemotherapy is diarrhea. You will also notice sensitivity to cold for the first few days after each treatment. Dr. Delton Coombes will also order anti-nausea medication known as Compazine to take as needed. Protect your hands to prevent any hand-foot skin reaction and keep your hands/feet moisturized. With repeated exposure to chemotherapy can cause neuropathy (pins and needles feeling in the hands and feet) - please let us know should you experience this. Neuropathy is most common in patients who already have a diagnosis of diabetes.  In order to safely administer chemotherapy, you will need a Port-A-Cath placed. This can be done by the Interventional Radiologist in [place].   Dr. Delton Coombes will request additional testing on your tumor, known as MSI testing. If your tumor tests MSI high, then you will not need chemotherapy but you will need immunotherapy. MSI high is rare, but is tested anyway. We should known by the time you have your port placed if you need chemotherapy versus immunotherapy.  Prior to the start of chemotherapy, you will meet  with our chemotherapy educator, Ebony Hail, to discuss your treatment regimen in detail and answer any questions that you may have.  Please feel free to call with any questions or concerns.  Follow-up as scheduled.  Thank you for choosing [facility] to provide your oncology and hematology care.   To afford each patient quality time with our provider, please arrive at least 15 minutes before your scheduled appointment time. You may need to reschedule your appointment if you arrive late (10 or more minutes). Arriving late affects you and other patients whose appointments are after yours.  Also, if you miss three or more appointments without notifying the office, you may be dismissed from the clinic at the provider's discretion.    Again, thank you for choosing [facility].  Our hope is that these requests will decrease the amount of time that you wait before being seen by our physicians.   If you have a lab appointment with the [facility] please come in thru the Main Entrance and check in at the main information desk.           _____________________________________________________________  Should you have questions after your visit to [facility], please contact our office at [phone] and follow the prompts.  Our office hours are 8:00 a.m. to 4:30 p.m. Monday - Thursday and 8:00 a.m. to 2:30 p.m. Friday.  Please note that voicemails left after 4:00 p.m. may not be returned until the following business day.  We are closed weekends and all major holidays.  You do have access to a nurse 24-7, just call the main number to the clinic [phone] and do not press any options, hold on the line and a nurse will answer the phone.    For prescription refill requests, have your pharmacy contact our office and allow 72 hours.    Masks are optional in the cancer centers. If you would like for your care team to wear a mask while they are taking care  of you, please let them know. You may have one support person who is at least 51 years old accompany you for your appointments.

## 2022-06-18 NOTE — Progress Notes (Signed)
MMR/MSI testing ordered on patient's biopsy per Dr. Delton Coombes request. I have called and spoken with Tomi Bamberger at [facility] who has escalated this request to their pathologist.

## 2022-06-18 NOTE — Progress Notes (Signed)
I met with the patient and his wife during and following initial visit with Dr. Delton Coombes. I introduced myself and explained my role in the patient's care. I provided my contact information and encouraged the patient and family to call with questions or concerns. Written information on FOLFOX, as discussed by Dr. Delton Coombes, was provided. All questions addressed and answered at this time. Patient scheduled for chemotherapy education.

## 2022-06-19 ENCOUNTER — Other Ambulatory Visit: Payer: Self-pay | Admitting: Internal Medicine

## 2022-06-22 ENCOUNTER — Ambulatory Visit (HOSPITAL_COMMUNITY)
Admission: RE | Admit: 2022-06-22 | Discharge: 2022-06-22 | Disposition: A | Discharge status: Home / Self Care | Payer: No Typology Code available for payment source | Source: Ambulatory Visit | Attending: Hematology | Admitting: Hematology

## 2022-06-22 ENCOUNTER — Other Ambulatory Visit: Payer: Self-pay

## 2022-06-22 DIAGNOSIS — C20 Malignant neoplasm of rectum: Secondary | ICD-10-CM | POA: Insufficient documentation

## 2022-06-22 DIAGNOSIS — Z800 Family history of malignant neoplasm of digestive organs: Secondary | ICD-10-CM | POA: Insufficient documentation

## 2022-06-22 DIAGNOSIS — C187 Malignant neoplasm of sigmoid colon: Secondary | ICD-10-CM | POA: Diagnosis not present

## 2022-06-22 DIAGNOSIS — Z452 Encounter for adjustment and management of vascular access device: Secondary | ICD-10-CM | POA: Diagnosis not present

## 2022-06-22 HISTORY — PX: IR IMAGING GUIDED PORT INSERTION: IMG5740

## 2022-06-22 MED ORDER — HEPARIN SOD (PORK) LOCK FLUSH 100 UNIT/ML IV SOLN
INTRAVENOUS | Status: AC
  Administered 2022-06-22: 500 [IU]
  Filled 2022-06-22: qty 5

## 2022-06-22 MED ORDER — FENTANYL CITRATE (PF) 100 MCG/2ML IJ SOLN
INTRAMUSCULAR | Status: AC
  Filled 2022-06-22: qty 2

## 2022-06-22 MED ORDER — MIDAZOLAM HCL 2 MG/2ML IJ SOLN
INTRAMUSCULAR | Status: AC | PRN
  Administered 2022-06-22 (×2): 1 mg via INTRAVENOUS

## 2022-06-22 MED ORDER — MIDAZOLAM HCL 2 MG/2ML IJ SOLN
INTRAMUSCULAR | Status: AC
  Filled 2022-06-22: qty 2

## 2022-06-22 MED ORDER — SODIUM CHLORIDE 0.9 % IV SOLN: INTRAVENOUS | Status: DC

## 2022-06-22 MED ORDER — FENTANYL CITRATE (PF) 100 MCG/2ML IJ SOLN
INTRAMUSCULAR | Status: AC | PRN
  Administered 2022-06-22 (×2): 50 ug via INTRAVENOUS

## 2022-06-22 MED ORDER — GELATIN ABSORBABLE 12-7 MM EX MISC
CUTANEOUS | Status: AC
  Filled 2022-06-22: qty 1

## 2022-06-22 MED ORDER — LIDOCAINE-EPINEPHRINE 1 %-1:100000 IJ SOLN
INTRAMUSCULAR | Status: AC
  Administered 2022-06-22: 20 mL
  Filled 2022-06-22: qty 1

## 2022-06-22 NOTE — Consult Note (Signed)
Chief Complaint: Patient was seen in consultation today for rectal cancer at the request of [facility]  Referring Physician(s): Katragadda,Sreedhar  Supervising Physician: Daryll Brod  Patient Status: [facility] - Out-pt  History of Present Illness: Troy Dillon is a 51 y.o. male with history of right femur fracture s/p surgery and rectal bleeding presents with recently diagnosed stage III rectal cancer.  There is a strong family history of colon cancer.  He is to begin chemotherapy and presents to IR for a port-a-cath placement.  He is NPO and on no blood thinners.  He is accompanied by phone by his wife, Troy Dillon.  He reports feeling well, and a little nervous.    Past Medical History:  Diagnosis Date   Rectal bleeding    Rectal cancer (Troy Dillon) 06/08/2022    Past Surgical History:  Procedure Laterality Date   COLONOSCOPY  06/08/2022   EYE SURGERY     FRACTURE SURGERY     Right Femur- Cables in bone remain in place, rod removed in 2000   ORTHOPEDIC SURGERY      Allergies: Hydrocodone and Latex  Medications: Prior to Admission medications   Medication Sig Start Date End Date Taking? Authorizing Provider  ibuprofen (ADVIL) 200 MG tablet Take 200 mg by mouth every 6 (six) hours as needed for moderate pain.   Yes [provider]  Multiple Vitamin (MULTIVITAMIN) tablet Take 1 tablet by mouth daily.   Yes [provider]  Salicylic Acid, Acne, (SALICYLIC ACID EX) Apply 1 Application topically daily as needed (acne).   Yes [provider]  sodium chloride (OCEAN) 0.65 % SOLN nasal spray Place 1 spray into both nostrils as needed for congestion.   Yes [provider]     Family History  Problem Relation Age of Onset   Cancer Mother        GYN malignancy details unknown   Colon cancer Father    Heart attack Father    Colon cancer Paternal Aunt    Colon cancer Paternal Grandmother    Colon cancer Paternal Grandfather     Social  History   Socioeconomic History   Marital status: Married    Spouse name: Not on file   Number of children: Not on file   Years of education: Not on file   Highest education level: Not on file  Occupational History   Not on file  Tobacco Use   Smoking status: Former    Types: Cigarettes    Quit date: 2003    Years since quitting: 20.8   Smokeless tobacco: Current    Types: Snuff   Tobacco comments:    Some Dipping  Vaping Use   Vaping Use: Never used  Substance and Sexual Activity   Alcohol use: Yes   Drug use: Never   Sexual activity: Not on file  Other Topics Concern   Not on file  Social History Narrative   Not on file   Social Determinants of Health   Financial Resource Strain: Not on file  Food Insecurity: Not on file  Transportation Needs: Not on file  Physical Activity: Not on file  Stress: Not on file  Social Connections: Not on file   Review of Systems  Constitutional: Negative.   HENT: Negative.    Eyes: Negative.   Respiratory: Negative.    Cardiovascular: Negative.   Gastrointestinal:  Positive for blood in stool. Negative for abdominal pain, nausea and vomiting.  Endocrine: Negative.   Genitourinary: Negative.   Musculoskeletal: Negative.  Skin: Negative.   Allergic/Immunologic: Negative.   Neurological: Negative.   Hematological: Negative.   Psychiatric/Behavioral: Negative.      Vital Signs: BP (!) 120/95   Pulse 65   Temp 98 F (36.7 C) (Temporal)   Resp 17   Ht '5\' 10"'$  (1.778 m)   Wt 178 lb (80.7 kg)   SpO2 97%   BMI 25.54 kg/m   Physical Exam Vitals reviewed.  Constitutional:      General: He is not in acute distress.    Appearance: Normal appearance. He is not ill-appearing.  HENT:     Head: Normocephalic and atraumatic.     Mouth/Throat:     Mouth: Mucous membranes are moist.     Pharynx: Oropharynx is clear.  Eyes:     Extraocular Movements: Extraocular movements intact.     Conjunctiva/sclera: Conjunctivae normal.   Cardiovascular:     Rate and Rhythm: Normal rate and regular rhythm.     Pulses: Normal pulses.  Pulmonary:     Effort: Pulmonary effort is normal. No respiratory distress.  Abdominal:     General: Abdomen is flat. There is no distension.     Palpations: Abdomen is soft.     Tenderness: There is no abdominal tenderness.  Musculoskeletal:     Cervical back: Normal range of motion and neck supple.  Skin:    General: Skin is warm and dry.     Capillary Refill: Capillary refill takes less than 2 seconds.  Neurological:     General: No focal deficit present.     Mental Status: He is alert and oriented to person, place, and time.  Psychiatric:        Mood and Affect: Mood normal.        Behavior: Behavior normal.     Imaging: No results found.  Labs:  CBC: Recent Labs    09/11/21 1128  WBC 4.0  HGB 16.5  HCT 45.9  PLT 179    COAGS: No results for input(s): "INR", "APTT" in the last 8760 hours.  BMP: Recent Labs    09/11/21 1128  NA 136  K 3.9  CL 103  CO2 27  GLUCOSE 95  BUN 13  CALCIUM 8.8*  CREATININE 1.16  GFRNONAA >60    Assessment and Plan:  [name] is a pleasant 51 year old male who presents with recently diagnosed rectal cancer.  Plan is for chemotherapy and a Port-a-Cath has been requested.  He presents in his usual state of health with no complaints today.  He is NPO and not on any blood thinners.  Will proceed with planned placement of Port-a-Cath with expected discharge soon thereafter.   Thank you for this interesting consult.  I greatly enjoyed meeting Troy Goldston and look forward to participating in their care.  A copy of this report was sent to the requesting provider on this date.  Risks and benefits of image guided port-a-catheter placement was discussed with the patient including, but not limited to bleeding, infection, pneumothorax, or fibrin sheath development and need for additional procedures.  All of the patient's questions  were answered, patient is agreeable to proceed. Consent signed and in chart.   Electronically Signed: Pasty Spillers, PA 06/22/2022, 8:17 AM   I spent a total of 30 Minutes in face to face in clinical consultation, greater than 50% of which was counseling/coordinating care for [facility] placement.

## 2022-06-22 NOTE — Procedures (Signed)
Interventional Radiology Procedure Note  Procedure: RT IJ POWER PORT    Complications: None  Estimated Blood Loss:  MIN  Findings: TIP SVCRA    M. TREVOR Boe Deans, MD    

## 2022-06-23 ENCOUNTER — Other Ambulatory Visit: Payer: Self-pay | Admitting: Hematology

## 2022-06-23 DIAGNOSIS — C20 Malignant neoplasm of rectum: Secondary | ICD-10-CM | POA: Diagnosis not present

## 2022-06-23 DIAGNOSIS — Z95828 Presence of other vascular implants and grafts: Secondary | ICD-10-CM

## 2022-06-23 DIAGNOSIS — H401331 Pigmentary glaucoma, bilateral, mild stage: Secondary | ICD-10-CM | POA: Diagnosis not present

## 2022-06-23 NOTE — Progress Notes (Signed)
START ON PATHWAY REGIMEN - Colorectal     A cycle is every 14 days:     Oxaliplatin      Leucovorin      Fluorouracil      Fluorouracil   **Always confirm dose/schedule in your pharmacy ordering system**  Patient Characteristics: Preoperative or Nonsurgical Candidate, M0 (Clinical Staging), Rectal, cT2, cN1 or cT3, cN0-1, and Not a Candidate for Sphincter-sparing Surgery or Neoadjuvant ChemoRT Preferred Tumor Location: Rectal Therapeutic Status: Preoperative or Nonsurgical Candidate, M0 (Clinical Staging) AJCC T Category: cT3 AJCC N Category: cN1a AJCC M Category: cM0 AJCC 8 Stage Grouping: IIIB Intent of Therapy: Curative Intent, Discussed with Patient

## 2022-06-24 ENCOUNTER — Other Ambulatory Visit: Payer: Self-pay

## 2022-06-25 ENCOUNTER — Encounter: Payer: Self-pay | Admitting: Hematology

## 2022-06-25 ENCOUNTER — Inpatient Hospital Stay: Payer: 59 | Attending: Hematology

## 2022-06-25 DIAGNOSIS — Z95828 Presence of other vascular implants and grafts: Secondary | ICD-10-CM | POA: Insufficient documentation

## 2022-06-25 DIAGNOSIS — Z5111 Encounter for antineoplastic chemotherapy: Secondary | ICD-10-CM | POA: Insufficient documentation

## 2022-06-25 DIAGNOSIS — Z5189 Encounter for other specified aftercare: Secondary | ICD-10-CM | POA: Insufficient documentation

## 2022-06-25 DIAGNOSIS — C20 Malignant neoplasm of rectum: Secondary | ICD-10-CM | POA: Insufficient documentation

## 2022-06-25 DIAGNOSIS — Z79899 Other long term (current) drug therapy: Secondary | ICD-10-CM | POA: Insufficient documentation

## 2022-06-25 MED ORDER — PROCHLORPERAZINE MALEATE 10 MG PO TABS: 10.0000 mg | ORAL_TABLET | Freq: Four times a day (QID) | ORAL | 1 refills | Status: DC | PRN

## 2022-06-25 MED ORDER — LIDOCAINE-PRILOCAINE 2.5-2.5 % EX CREA: TOPICAL_CREAM | CUTANEOUS | 3 refills | Status: DC

## 2022-06-25 NOTE — Progress Notes (Signed)

## 2022-06-25 NOTE — Patient Instructions (Addendum)
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 You will be treated in the clinic every 2 weeks for a total of 4 months with a combination of chemotherapy drugs. Those drugs are oxaliplatin and fluorouracil (5FU). You will also receive a drug called leucovorin. This is not a chemotherapy drug, but a vitamin that helps the 5FU work better. The intent of treatment is cure. You will see the doctor regularly throughout treatment.  We will obtain blood work from you prior to every treatment and monitor your results to make sure it is safe to give your treatment. The doctor monitors your response to treatment by the way you are feeling, your blood work, and by obtaining scans periodically.  There will be wait times while you are here for treatment.  It will take about 30 minutes to 1 hour for your lab work to result.  Then there will be wait times while pharmacy mixes your medications.    Medications you will receive in the clinic prior to your chemotherapy medications:  Aloxi:  ALOXI is used in adults to help prevent nausea and vomiting that happens with certain chemotherapy drugs.  Aloxi is a long acting medication, and will remain in your system for about two days.   Dexamethasone:  This is a steroid given prior to chemotherapy to help prevent allergic reactions; it may also help prevent and control nausea and diarrhea.     Oxaliplatin (Eloxatin)  About This Drug  Oxaliplatin is used to treat cancer. It is given in the vein (IV).  It takes two hours to infuse.  Possible Side Effects   Bone marrow suppression. This is a decrease in the number of white blood cells, red blood cells, and platelets. This may raise your risk of infection, make you tired and weak (fatigue), and raise your risk of bleeding.   Tiredness   Soreness of the mouth and throat. You may have red areas, white patches, or sores that hurt.   Nausea and vomiting  (throwing up)   Diarrhea (loose bowel movements)   Changes in your liver function   Effects on the nerves called peripheral neuropathy. You may feel numbness, tingling, or pain in your hands and feet, and may be worse in cold temperatures. It may be hard for you to button your clothes, open jars, or walk as usual. The effect on the nerves may get worse with more doses of the drug. These effects get better in some people after the drug is stopped but it does not get better in all people  Note: Each of the side effects above was reported in 40% or greater of patients treated with oxaliplatin. Not all possible side effects are included above.   Warnings and Precautions   Allergic reactions, including anaphylaxis, which may be life-threatening are rare but may happen in some patients. Signs of allergic reaction to this drug may be swelling of the face, feeling like your tongue or throat are swelling, trouble breathing, rash, itching, fever, chills, feeling dizzy, and/or feeling that your heart is beating in a fast or not normal way. If this happens, do not take another dose of this drug. You should get urgent medical treatment.   Inflammation (swelling) of the lungs, which may be life-threatening. You may have a dry cough or trouble breathing.   Effects on the nerves (neuropathy) may resolve within 14 days, or it may persist beyond 14 days.   Severe decrease in white  blood cells when combined with the chemotherapy agents 5-fluorouracil and leucovorin. This may be life-threatening.   Severe changes in your liver function   Abnormal heart beat and/or EKG, which can be life-threatening   Rhabdomyolysis- damage to your muscles which may release proteins in your blood and affect how your kidneys work, which can be life-threatening. You may have severe muscle weakness and/or pain, or dark urine.  Important Information   This drug may impair your ability to drive or use machinery. Talk to your  doctor and/or nurse about precautions you may need to take.   This drug may be present in the saliva, tears, sweat, urine, stool, vomit, semen, and vaginal secretions. Talk to your doctor and/or your nurse about the necessary precautions to take during this time.  * The effects on the nerves can be aggravated by exposure to cold. Avoid cold beverages, use of ice and make sure you cover your skin and dress warmly prior to being exposed to cold temperatures while you are receiving treatment with oxaliplatin*   Treating Side Effects   Manage tiredness by pacing your activities for the day.   Be sure to include periods of rest between energy-draining activities.   To decrease the risk of infection, wash your hands regularly.   Avoid close contact with people who have a cold, the flu, or other infections.  Take your temperature as your doctor or nurse tells you, and whenever you feel like you may have a fever.   To help decrease the risk of bleeding, use a soft toothbrush. Check with your nurse before using dental floss.   Be very careful when using knives or tools.   Use an electric shaver instead of a razor.   Drink plenty of fluids (a minimum of eight glasses per day is recommended).   Mouth care is very important. Your mouth care should consist of routine, gentle cleaning of your teeth or dentures and rinsing your mouth with a mixture of 1/2 teaspoon of salt in 8 ounces of water or 1/2 teaspoon of baking soda in 8 ounces of water. This should be done at least after each meal and at bedtime.   If you have mouth sores, avoid mouthwash that has alcohol. Also avoid alcohol and smoking because they can bother your mouth and throat.   To help with nausea and vomiting, eat small, frequent meals instead of three large meals a day. Choose foods and drinks that are at room temperature. Ask your nurse or doctor about other helpful tips and medicine that is available to help stop or lessen these  symptoms.   If you throw up or have loose bowel movements, you should drink more fluids so that you do not become dehydrated (lack of water in the body from losing too much fluid).   If you have diarrhea, eat low-fiber foods that are high in protein and calories and avoid foods that can irritate your digestive tracts or lead to cramping.   Ask your nurse or doctor about medicine that can lessen or stop your diarrhea.   If you have numbness and tingling in your hands and feet, be careful when cooking, walking, and handling sharp objects and hot liquids.   Do not drink cold drinks or use ice in beverages. Drink fluids at room temperature or warmer, and drink through a straw.   Wear gloves to touch cold objects, and wear warm clothing and cover you skin during cold weather.   Food and Drug Interactions  There are no known interactions of oxaliplatin with food and other medications.   This drug may interact with other medicines. Tell your doctor and pharmacist about all the prescription and over-the-counter medicines and dietary supplements (vitamins, minerals, herbs and others) that you are taking at this time. Also, check with your doctor or pharmacist before starting any new prescription or over-the-counter medicines, or dietary supplements to make sure that there are no interactions   When to Call the Doctor  Call your doctor or nurse if you have any of these symptoms and/or any new or unusual symptoms:   Fever of 100.4 F (38 C) or higher   Chills   Tiredness that interferes with your daily activities   Feeling dizzy or lightheaded   Easy bleeding or bruising   Feeling that your heart is beating in a fast or not normal way (palpitations)   Pain in your chest   Dry cough   Trouble breathing   Pain in your mouth or throat that makes it hard to eat or drink   Nausea that stops you from eating or drinking and/or is not relieved by prescribed medicines   Throwing up    Diarrhea, 4 times in one day or diarrhea with lack of strength or a feeling of being dizzy   Numbness, tingling, or pain in your hands and feet   Signs of possible liver problems: dark urine, pale bowel movements, bad stomach pain, feeling very tired and weak, unusual itching, or yellowing of the eyes or skin   Signs of rhabdomyolysis: decreased urine, very dark urine, muscle pain in the shoulders, thighs, or lower back; muscle weakness or trouble moving arms and legs   Signs of allergic reaction: swelling of the face, feeling like your tongue or throat are swelling, trouble breathing, rash, itching, fever, chills, feeling dizzy, and/or feeling that your heart is beating in a fast or not normal way. If this happens, call 911 for emergency care.   If you think you may be pregnant  Reproduction Warnings   Pregnancy warning: This drug may have harmful effects on the unborn baby. Women of childbearing potential should use effective methods of birth control during your cancer treatment. Let your doctor know right away if you think you may be pregnant or may have impregnated your partner.   Breastfeeding warning: It is not known if this drug passes into breast milk. For this reason, women should talk to their doctor about the risks and benefits of breastfeeding during treatment with this drug because this drug may enter the breast milk and cause harm to a breastfeeding baby.   Fertility warning: Human fertility studies have not been done with this drug. Talk with your doctor or nurse if you plan to have children. Ask for information on sperm or egg banking.   Leucovorin Calcium  About This Drug  Leucovorin is a vitamin. It is used in combination with other cancer fighting drugs such as 5-fluorouracil and methotrexate. Leucovorin is given in the vein (IV).  This drug runs at the same time as the oxaliplatin and takes 2 hours to infuse.   Possible Side Effects  Rash and itching  Note:  Leucovorin by itself has very few side effects. Other side effects you may have can be caused by the other drugs you are taking, such as 5-fluorouracil.   Warnings and Precautions   Allergic reactions, including anaphylaxis are rare but may happen in some patients. Signs of allergic reaction to this drug may  be swelling of the face, feeling like your tongue or throat are swelling, trouble breathing, rash, itching, fever, chills, feeling dizzy, and/or feeling that your heart is beating in a fast or not normal way. If this happens, do not take another dose of this drug. You should get urgent medical treatment.  Food and Drug Interactions   There are no known interactions of leucovorin with food.   This drug may interact with other medicines. Tell your doctor and pharmacist about all the prescription and over-the-counter medicines and dietary supplements (vitamins, minerals, herbs and others) that you are taking at this time.   Also, check with your doctor or pharmacist before starting any new prescription or over-the-counter medicines, or dietary supplements to make sure that there are no interactions.   When to Call the Doctor  Call your doctor or nurse if you have any of these symptoms and/or any new or unusual symptoms:   A new rash or a rash that is not relieved by prescribed medicines   Signs of allergic reaction: swelling of the face, feeling like your tongue or throat are swelling, trouble breathing, rash, itching, fever, chills, feeling dizzy, and/or feeling that your heart is beating in a fast or not normal way. If this happens, call 911 for emergency care.   If you think you may be pregnant   Reproduction Warnings   Pregnancy warning: It is not known if this drug may harm an unborn child. For this reason, be sure to talk with your doctor if you are pregnant or planning to become pregnant while receiving this drug. Let your doctor know right away if you think you may be  pregnant   Breastfeeding warning: It is not known if this drug passes into breast milk. For this reason, women should talk to their doctor about the risks and benefits of breastfeeding during treatment with this drug because this drug may enter the breast milk and cause harm to a breastfeeding baby.   Fertility warning: Human fertility studies have not been done with this drug. Talk with your doctor or nurse if you plan to have children. Ask for information on sperm or egg banking.   5-Fluorouracil (Adrucil; 5FU)  About This Drug  Fluorouracil is used to treat cancer. It is given in the vein (IV). It is given as an IV push from a syringe and also as a continuous infusion given via an ambulatory pump (a pump you take home and wear for a specified amount of time).  Possible Side Effects   Bone marrow suppression. This is a decrease in the number of white blood cells, red blood cells, and platelets. This may raise your risk of infection, make you tired and weak (fatigue), and raise your risk of bleeding   Changes in the tissue of the heart and/or heart attack. Some changes may happen that can cause your heart to have less ability to pump blood.   Blurred vision or other changes in eyesight   Nausea and throwing up (vomiting)   Diarrhea (loose bowel movements)   Ulcers - sores that may cause pain or bleeding in your digestive tract, which includes your mouth, esophagus, stomach, small/large intestines and rectum   Soreness of the mouth and throat. You may have red areas, white patches, or sores that hurt.   Allergic reactions, including anaphylaxis are rare but may happen in some patients. Signs of allergic reaction to this drug may be swelling of the face, feeling like your tongue or throat  are swelling, trouble breathing, rash, itching, fever, chills, feeling dizzy, and/or feeling that your heart is beating in a fast or not normal way. If this happens, do not take another dose of this  drug. You should get urgent medical treatment.   Sensitivity to light (photosensitivity). Photosensitivity means that you may become more sensitive to the sun and/or light. You may get a skin rash/reaction if you are in the sun or are exposed to sun lamps and tanning beds. Your eyes may water more, mostly in bright light.   Changes in your nail color, nail loss and/or brittle nail   Darkening of the skin, or changes to the color of your skin and/or veins used for infusion   Rash, dry skin, or itching  Note: Not all possible side effects are included above.  Warnings and Precautions   Hand-and-foot syndrome. The palms of your hands or soles of your feet may tingle, become numb, painful, swollen, or red.   Changes in your central nervous system can happen. The central nervous system is made up of your brain and spinal cord. You could feel extreme tiredness, agitation, confusion, hallucinations (see or hear things that are not there), trouble understanding or speaking, loss of control of your bowels or bladder, eyesight changes, numbness or lack of strength to your arms, legs, face, or body, or coma. If you start to have any of these symptoms let your doctor know right away.   Side effects of this drug may be unexpectedly severe in some patients  Note: Some of the side effects above are very rare. If you have concerns and/or questions, please discuss them with your medical team.   Important Information   This drug may be present in the saliva, tears, sweat, urine, stool, vomit, semen, and vaginal secretions. Talk to your doctor and/or your nurse about the necessary precautions to take during this time.   Treating Side Effects   Manage tiredness by pacing your activities for the day.   Be sure to include periods of rest between energy-draining activities.   To help decrease the risk of infections, wash your hands regularly.   Avoid close contact with people who have a cold, the flu,  or other infections.   Take your temperature as your doctor or nurse tells you, and whenever you feel like you may have a fever.   Use a soft toothbrush. Check with your nurse before using dental floss.   Be very careful when using knives or tools.   Use an electric shaver instead of a razor.   If you have a nose bleed, sit with your head tipped slightly forward. Apply pressure by lightly pinching the bridge of your nose between your thumb and forefinger. Call your doctor if you feel dizzy or faint or if the bleeding doesn't stop after 10 to 15 minutes.   Drink plenty of fluids (a minimum of eight glasses per day is recommended).   If you throw up or have loose bowel movements, you should drink more fluids so that you do not  become dehydrated (lack of water in the body from losing too much fluid).   To help with nausea and vomiting, eat small, frequent meals instead of three large meals a day. Choose foods and drinks that are at room temperature. Ask your nurse or doctor about other helpful tips and medicine that is available to help, stop, or lessen these symptoms.   If you have diarrhea, eat low-fiber foods that are high in  protein and calories and avoid foods that can irritate your digestive tracts or lead to cramping.   Ask your nurse or doctor about medicine that can lessen or stop your diarrhea.   Mouth care is very important. Your mouth care should consist of routine, gentle cleaning of your teeth or dentures and rinsing your mouth with a mixture of 1/2 teaspoon of salt in 8 ounces of water or 1/2 teaspoon of baking soda in 8 ounces of water. This should be done at least after each meal and at bedtime.   If you have mouth sores, avoid mouthwash that has alcohol. Also avoid alcohol and smoking because they can bother your mouth and throat.   Keeping your nails moisturized may help with brittleness.   To help with itching, moisturize your skin several times day.   Use sunscreen  with SPF 30 or higher when you are outdoors even for a short time. Cover up when you are out in the sun. Wear wide-brimmed hats, long-sleeved shirts, and pants. Keep your neck, chest, and back covered. Wear dark sun glasses when in the sun or bright lights.   If you get a rash do not put anything on it unless your doctor or nurse says you may. Keep the area around the rash clean and dry. Ask your doctor for medicine if your rash bothers you.   Keeping your pain under control is important to your well-being. Please tell your doctor or nurse if you are experiencing pain.   Food and Drug Interactions   There are no known interactions of fluorouracil with food.   Check with your doctor or pharmacist about all other prescription medicines and over-the-counter medicines and dietary supplements (vitamins, minerals, herbs and others) you are taking before starting this medicine as there are known drug interactions with 5-fluoroucacil. Also, check with your doctor or pharmacist before starting any new prescription or over-the-counter medicines, or dietary supplements to make sure that there are no interactions.  When to Call the Doctor  Call your doctor or nurse if you have any of these symptoms and/or any new or unusual symptoms:   Fever of 100.4 F (38 C) or higher   Chills   Easy bleeding or bruising   Nose bleed that doesn't stop bleeding after 10-15 minutes   Trouble breathing   Feeling dizzy or lightheaded   Feeling that your heart is beating in a fast or not normal way (palpitations)   Chest pain or symptoms of a heart attack. Most heart attacks involve pain in the center of the chest that lasts more than a few minutes. The pain may go away and come back or it can be constant. It can feel like pressure, squeezing, fullness, or pain. Sometimes pain is felt in one or both arms, the back, neck, jaw, or stomach. If any of these symptoms last 2 minutes, call 911.   Confusion and/or  agitation   Hallucinations   Trouble understanding or speaking   Loss of control of bowels or bladder   Blurry vision or changes in your eyesight   Headache that does not go away   Numbness or lack of strength to your arms, legs, face, or body   Nausea that stops you from eating or drinking and/or is not relieved by prescribed medicines   Throwing up more than 3 times a day   Diarrhea, 4 times in one day or diarrhea with lack of strength or a feeling of being dizzy   Pain in  your mouth or throat that makes it hard to eat or drink   Pain along the digestive tract - especially if worse after eating   Blood in your vomit (bright red or coffee-ground) and/or stools (bright red, or black/tarry)   Coughing up blood   Tiredness that interferes with your daily activities   Painful, red, or swollen areas on your hands or feet or around your nails   A new rash or a rash that is not relieved by prescribed medicines   Develop sensitivity to sunlight/light   Numbness and/or tingling of your hands and/or feet   Signs of allergic reaction: swelling of the face, feeling like your tongue or throat are swelling, trouble breathing, rash, itching, fever, chills, feeling dizzy, and/or feeling that your heart is beating in a fast or not normal way. If this happens, call 911 for emergency care.   If you think you are pregnant or may have impregnated your partner  Reproduction Warnings   Pregnancy warning: This drug may have harmful effects on the unborn baby. Women of child bearing potential should use effective methods of birth control during your cancer treatment and 3 months after treatment. Men with male partners of childbearing potential should use effective methods of birth control during your cancer treatment and for 3 months after your cancer treatment. Let your doctor know right away if you think you may be pregnant or may have impregnated your partner.   Breastfeeding warning: It  is not known if this drug passes into breast milk. For this reason, Women should not breastfeed during treatment because this drug could enter the breast milk and cause harm to a breastfeeding baby.   Fertility warning: In men and women both, this drug may affect your ability to have children in the future. Talk with your doctor or nurse if you plan to have children. Ask for information on sperm or egg banking.   SELF CARE ACTIVITIES WHILE RECEIVING CHEMOTHERAPY:  Hydration Increase your fluid intake and drink at least 8 to 12 cups (64 ounces) of water/decaffeinated beverages per day after treatment. You can still have your cup of coffee or soda but these beverages do not count as part of your 8 to 12 cups that you need to drink daily. No alcohol intake.  Medications Continue taking your normal prescription medication as prescribed.  If you start any new herbal or new supplements please let us know first to make sure it is safe.  Mouth Care Have teeth cleaned professionally before starting treatment. Keep dentures and partial plates clean. Use soft toothbrush and do not use mouthwashes that contain alcohol. Biotene is a good mouthwash that is available at most pharmacies or may be ordered by calling [phone]. Use warm salt water gargles (1 teaspoon salt per 1 quart warm water) before and after meals and at bedtime. If you need dental work, please let the doctor know before you go for your appointment so that we can coordinate the best possible time for you in regards to your chemo regimen. You need to also let your dentist know that you are actively taking chemo. We may need to do labs prior to your dental appointment.  Skin Care Always use sunscreen that has not expired and with SPF (Sun Protection Factor) of 50 or higher. Wear hats to protect your head from the sun. Remember to use sunscreen on your hands, ears, face, & feet.  Use good moisturizing lotions such as udder cream, eucerin, or  even  Vaseline. Some chemotherapies can cause dry skin, color changes in your skin and nails.    Avoid long, hot showers or baths. Use gentle, fragrance-free soaps and laundry detergent. Use moisturizers, preferably creams or ointments rather than lotions because the thicker consistency is better at preventing skin dehydration. Apply the cream or ointment within 15 minutes of showering. Reapply moisturizer at night, and moisturize your hands every time after you wash them.  Hair Loss (if your doctor says your hair will fall out)  If your doctor says that your hair is likely to fall out, decide before you begin chemo whether you want to wear a wig. You may want to shop before treatment to match your hair color. Hats, turbans, and scarves can also camouflage hair loss, although some people prefer to leave their heads uncovered. If you go bare-headed outdoors, be sure to use sunscreen on your scalp. Cut your hair short. It eases the inconvenience of shedding lots of hair, but it also can reduce the emotional impact of watching your hair fall out. Don't perm or color your hair during chemotherapy. Those chemical treatments are already damaging to hair and can enhance hair loss. Once your chemo treatments are done and your hair has grown back, it's OK to resume dyeing or perming hair.  With chemotherapy, hair loss is almost always temporary. But when it grows back, it may be a different color or texture. In older adults who still had hair color before chemotherapy, the new growth may be completely gray.  Often, new hair is very fine and soft.  Infection Prevention Please wash your hands for at least 30 seconds using warm soapy water. Handwashing is the #1 way to prevent the spread of germs. Stay away from sick people or people who are getting over a cold. If you develop respiratory systems such as green/yellow mucus production or productive cough or persistent cough let us know and we will see if you need  an antibiotic. It is a good idea to keep a pair of gloves on when going into grocery stores/[employer] to decrease your risk of coming into contact with germs on the carts, etc. Carry alcohol hand gel with you at all times and use it frequently if out in public. If your temperature reaches 100.5 or higher please call the clinic and let us know.  If it is after hours or on the weekend please go to the ER if your temperature is over 100.5.  Please have your own personal thermometer at home to use.    Sex and bodily fluids If you are going to have sex, a condom must be used to protect the person that isn't taking chemotherapy. Chemo can decrease your libido (sex drive). For a few days after chemotherapy, chemotherapy can be excreted through your bodily fluids.  When using the toilet please close the lid and flush the toilet twice.  Do this for a few day after you have had chemotherapy.   Effects of chemotherapy on your sex life Some changes are simple and won't last long. They won't affect your sex life permanently.  Sometimes you may feel: too tired not strong enough to be very active sick or sore  not in the mood anxious or low Your anxiety might not seem related to sex. For example, you may be worried about the cancer and how your treatment is going. Or you may be worried about money, or about how you family are coping with your illness.  These things can  cause stress, which can affect your interest in sex. It's important to talk to your partner about how you feel.  Remember - the changes to your sex life don't usually last long. There's usually no medical reason to stop having sex during chemo. The drugs won't have any long term physical effects on your performance or enjoyment of sex. Cancer can't be passed on to your partner during sex  Contraception It's important to use reliable contraception during treatment. Avoid getting pregnant while you or your partner are having chemotherapy. This is  because the drugs may harm the baby. Sometimes chemotherapy drugs can leave a man or woman infertile.  This means you would not be able to have children in the future. You might want to talk to someone about permanent infertility. It can be very difficult to learn that you may no longer be able to have children. Some people find counselling helpful. There might be ways to preserve your fertility, although this is easier for men than for women. You may want to speak to a fertility expert. You can talk about sperm banking or harvesting your eggs. You can also ask about other fertility options, such as donor eggs. If you have or have had breast cancer, your doctor might advise you not to take the contraceptive pill. This is because the hormones in it might affect the cancer. It is not known for sure whether or not chemotherapy drugs can be passed on through semen or secretions from the vagina. Because of this some doctors advise people to use a barrier method if you have sex during treatment. This applies to vaginal, anal or oral sex. Generally, doctors advise a barrier method only for the time you are actually having the treatment and for about a week after your treatment. Advice like this can be worrying, but this does not mean that you have to avoid being intimate with your partner. You can still have close contact with your partner and continue to enjoy sex.  Animals If you have cats or birds we just ask that you not change the litter or change the cage.  Please have someone else do this for you while you are on chemotherapy.   Food Safety During and After Cancer Treatment Food safety is important for people both during and after cancer treatment. Cancer and cancer treatments, such as chemotherapy, radiation therapy, and stem cell/bone marrow transplantation, often weaken the immune system. This makes it harder for your body to protect itself from foodborne illness, also called food poisoning. Foodborne  illness is caused by eating food that contains harmful bacteria, parasites, or viruses.  Foods to avoid Some foods have a higher risk of becoming tainted with bacteria. These include: Unwashed fresh fruit and vegetables, especially leafy vegetables that can hide dirt and other contaminants Raw sprouts, such as alfalfa sprouts Raw or undercooked beef, especially ground beef, or other raw or undercooked meat and poultry Fatty, fried, or spicy foods immediately before or after treatment.  These can sit heavy on your stomach and make you feel nauseous. Raw or undercooked shellfish, such as oysters. Sushi and sashimi, which often contain raw fish.  Unpasteurized beverages, such as unpasteurized fruit juices, raw milk, raw yogurt, or cider Undercooked eggs, such as soft boiled, over easy, and poached; raw, unpasteurized eggs; or foods made with raw egg, such as homemade raw cookie dough and homemade mayonnaise  Simple steps for food safety  Shop smart. Do not buy food stored or displayed in an  unclean area. Do not buy bruised or damaged fruits or vegetables. Do not buy cans that have cracks, dents, or bulges. Pick up foods that can spoil at the end of your shopping trip and store them in a cooler on the way home.  Prepare and clean up foods carefully. Rinse all fresh fruits and vegetables under running water, and dry them with a clean towel or paper towel. Clean the top of cans before opening them. After preparing food, wash your hands for 20 seconds with hot water and soap. Pay special attention to areas between fingers and under nails. Clean your utensils and dishes with hot water and soap. Disinfect your kitchen and cutting boards using 1 teaspoon of liquid, unscented bleach mixed into 1 quart of water.    Dispose of old food. Eat canned and packaged food before its expiration date (the "use by" or "best before" date). Consume refrigerated leftovers within 3 to 4 days. After that time,  throw out the food. Even if the food does not smell or look spoiled, it still may be unsafe. Some bacteria, such as Listeria, can grow even on foods stored in the refrigerator if they are kept for too long.  Take precautions when eating out. At restaurants, avoid buffets and salad bars where food sits out for a long time and comes in contact with many people. Food can become contaminated when someone with a virus, often a norovirus, or another "bug" handles it. Put any leftover food in a "to-go" container yourself, rather than having the server do it. And, refrigerate leftovers as soon as you get home. Choose restaurants that are clean and that are willing to prepare your food as you order it cooked.   AT HOME MEDICATIONS:                                                                                                                                                                Compazine/Prochlorperazine '10mg'$  tablet. Take 1 tablet every 6 hours as needed for nausea/vomiting. (This can make you sleepy)   EMLA cream. Apply a quarter size amount to port site 1 hour prior to chemo. Do not rub in. Cover with plastic wrap.    Diarrhea Sheet   If you are having loose stools/diarrhea, please purchase Imodium and begin taking as outlined:  At the first sign of poorly formed or loose stools you should begin taking Imodium (loperamide) 2 mg capsules.  Take two tablets ('4mg'$ ) followed by one tablet ('2mg'$ ) every 2 hours - DO NOT EXCEED 8 tablets in 24 hours.  If it is bedtime and you are having loose stools, take 2 tablets at bedtime, then 2 tablets every 4 hours until morning.   Always call the [facility] if you are having loose  stools/diarrhea that you can't get under control.  Loose stools/diarrhea leads to dehydration (loss of water) in your body.  We have other options of trying to get the loose stools/diarrhea to stop but you must let us know!   Constipation Sheet  Colace - 100 mg capsules -  take 2 capsules daily.  If this doesn't help then you can increase to 2 capsules twice daily.  Please call if the above does not work for you. Do not go more than 2 days without a bowel movement.  It is very important that you do not become constipated.  It will make you feel sick to your stomach (nausea) and can cause abdominal pain and vomiting.  Nausea Sheet   Compazine/Prochlorperazine '10mg'$  tablet. Take 1 tablet every 6 hours as needed for nausea/vomiting (This can make you drowsy).  If you are having persistent nausea (nausea that does not stop) please call the [facility] and let us know the amount of nausea that you are experiencing.  If you begin to vomit, you need to call the [facility] and if it is the weekend and you have vomited more than one time and can't get it to stop-go to the Emergency Room.  Persistent nausea/vomiting can lead to dehydration (loss of fluid in your body) and will make you feel very weak and unwell. Ice chips, sips of clear liquids, foods that are at room temperature, crackers, and toast tend to be better tolerated.   SYMPTOMS TO REPORT AS SOON AS POSSIBLE AFTER TREATMENT:  FEVER GREATER THAN 100.4 F  CHILLS WITH OR WITHOUT FEVER  NAUSEA AND VOMITING THAT IS NOT CONTROLLED WITH YOUR NAUSEA MEDICATION  UNUSUAL SHORTNESS OF BREATH  UNUSUAL BRUISING OR BLEEDING  TENDERNESS IN MOUTH AND THROAT WITH OR WITHOUT   PRESENCE OF ULCERS  URINARY PROBLEMS  BOWEL PROBLEMS  UNUSUAL RASH      Wear comfortable clothing and clothing appropriate for easy access to any Portacath or PICC line. Let us know if there is anything that we can do to make your therapy better!    What to do if you need assistance after hours or on the weekends: CALL [phone].  HOLD on the line, do not hang up.  You will hear multiple messages but at the end you will be connected with a nurse triage line.  They will contact the doctor if necessary.  Most of the time they will be  able to assist you.  Do not call the hospital operator.      I have been informed and understand all of the instructions given to me and have received a copy. I have been instructed to call the clinic [phone] or my family physician as soon as possible for continued medical care, if indicated. I do not have any more questions at this time but understand that I may call the [facility] or the Patient Navigator at [phone] during office hours should I have questions or need assistance in obtaining follow-up care.

## 2022-06-26 ENCOUNTER — Other Ambulatory Visit: Payer: Self-pay

## 2022-06-29 ENCOUNTER — Inpatient Hospital Stay (HOSPITAL_BASED_OUTPATIENT_CLINIC_OR_DEPARTMENT_OTHER): Payer: 59 | Admitting: Hematology

## 2022-06-29 ENCOUNTER — Inpatient Hospital Stay: Payer: 59

## 2022-06-29 ENCOUNTER — Inpatient Hospital Stay: Payer: 59 | Admitting: Dietician

## 2022-06-29 VITALS — BP 124/80 | HR 65 | Temp 96.8°F | Resp 18

## 2022-06-29 DIAGNOSIS — Z95828 Presence of other vascular implants and grafts: Secondary | ICD-10-CM

## 2022-06-29 DIAGNOSIS — Z5111 Encounter for antineoplastic chemotherapy: Secondary | ICD-10-CM | POA: Diagnosis not present

## 2022-06-29 DIAGNOSIS — C20 Malignant neoplasm of rectum: Secondary | ICD-10-CM

## 2022-06-29 DIAGNOSIS — Z79899 Other long term (current) drug therapy: Secondary | ICD-10-CM | POA: Diagnosis not present

## 2022-06-29 DIAGNOSIS — Z5189 Encounter for other specified aftercare: Secondary | ICD-10-CM | POA: Diagnosis not present

## 2022-06-29 LAB — COMPREHENSIVE METABOLIC PANEL
ALT: 19 U/L (ref 0–44)
AST: 20 U/L (ref 15–41)
Albumin: 3.7 g/dL (ref 3.5–5.0)
Alkaline Phosphatase: 100 U/L (ref 38–126)
Anion gap: 7 (ref 5–15)
BUN: 16 mg/dL (ref 6–20)
CO2: 24 mmol/L (ref 22–32)
Calcium: 8.5 mg/dL — ABNORMAL LOW (ref 8.9–10.3)
Chloride: 106 mmol/L (ref 98–111)
Creatinine, Ser: 1.03 mg/dL (ref 0.61–1.24)
GFR, Estimated: >60 mL/min (ref >60)
Glucose, Bld: 114 mg/dL — ABNORMAL HIGH (ref 70–99)
Potassium: 3.7 mmol/L (ref 3.5–5.1)
Sodium: 137 mmol/L (ref 135–145)
Total Bilirubin: 1.1 mg/dL (ref 0.3–1.2)
Total Protein: 6.9 g/dL (ref 6.5–8.1)

## 2022-06-29 LAB — CBC WITH DIFFERENTIAL/PLATELET
Abs Immature Granulocytes: 0.01 10*3/uL (ref 0.00–0.07)
Basophils Absolute: 0.1 10*3/uL (ref 0.0–0.1)
Basophils Relative: 1 %
Eosinophils Absolute: 0.2 10*3/uL (ref 0.0–0.5)
Eosinophils Relative: 5 %
HCT: 41.7 % (ref 39.0–52.0)
Hemoglobin: 14.7 g/dL (ref 13.0–17.0)
Immature Granulocytes: 0 %
Lymphocytes Relative: 29 %
Lymphs Abs: 1.1 10*3/uL (ref 0.7–4.0)
MCH: 31.1 pg (ref 26.0–34.0)
MCHC: 35.3 g/dL (ref 30.0–36.0)
MCV: 88.3 fL (ref 80.0–100.0)
Monocytes Absolute: 0.4 10*3/uL (ref 0.1–1.0)
Monocytes Relative: 10 %
Neutro Abs: 2 10*3/uL (ref 1.7–7.7)
Neutrophils Relative %: 55 %
Platelets: 158 10*3/uL (ref 150–400)
RBC: 4.72 MIL/uL (ref 4.22–5.81)
RDW: 11.6 % (ref 11.5–15.5)
WBC: 3.7 10*3/uL — ABNORMAL LOW (ref 4.0–10.5)
nRBC: 0 % (ref 0.0–0.2)

## 2022-06-29 MED ORDER — PALONOSETRON HCL INJECTION 0.25 MG/5ML
0.2500 mg | Freq: Once | INTRAVENOUS | Status: AC
  Administered 2022-06-29: 0.25 mg via INTRAVENOUS
  Filled 2022-06-29: qty 5

## 2022-06-29 MED ORDER — FLUOROURACIL CHEMO INJECTION 2.5 GM/50ML
400.0000 mg/m2 | Freq: Once | INTRAVENOUS | Status: AC
  Administered 2022-06-29: 800 mg via INTRAVENOUS
  Filled 2022-06-29: qty 16

## 2022-06-29 MED ORDER — OXALIPLATIN CHEMO INJECTION 100 MG/20ML
85.0000 mg/m2 | Freq: Once | INTRAVENOUS | Status: AC
  Administered 2022-06-29: 170 mg via INTRAVENOUS
  Filled 2022-06-29: qty 34

## 2022-06-29 MED ORDER — DEXTROSE 5 % IV SOLN: Freq: Once | INTRAVENOUS | Status: AC

## 2022-06-29 MED ORDER — SODIUM CHLORIDE 0.9 % IV SOLN
10.0000 mg | Freq: Once | INTRAVENOUS | Status: AC
  Administered 2022-06-29: 10 mg via INTRAVENOUS
  Filled 2022-06-29: qty 10

## 2022-06-29 MED ORDER — SODIUM CHLORIDE 0.9 % IV SOLN
2490.0000 mg/m2 | INTRAVENOUS | Status: DC
  Administered 2022-06-29: 5000 mg via INTRAVENOUS
  Filled 2022-06-29: qty 100

## 2022-06-29 MED ORDER — SODIUM CHLORIDE 0.9% FLUSH
10.0000 mL | Freq: Once | INTRAVENOUS | Status: AC
  Administered 2022-06-29: 10 mL via INTRAVENOUS

## 2022-06-29 MED ORDER — LEUCOVORIN CALCIUM INJECTION 350 MG
400.0000 mg/m2 | Freq: Once | INTRAVENOUS | Status: AC
  Administered 2022-06-29: 800 mg via INTRAVENOUS
  Filled 2022-06-29: qty 40

## 2022-06-29 NOTE — Progress Notes (Addendum)
Pt presents today for C1D1 Folfox per provider's order. Vital signs and labs WNL for treatment. Okay to proceed with treatment today per Dr.K.  Education provided for chemotherapy pump. Pt verbalized understanding. Pt advised to call the clinic if needed.  C1D1 Folfox given today per MD orders. Tolerated infusion without adverse affects. Vital signs stable. No complaints at this time. Discharged from clinic ambulatory in stable condition. Alert and oriented x 3. F/U with [facility] as scheduled. 5FU ambulatory pump infusing.

## 2022-06-29 NOTE — Patient Instructions (Addendum)
[place] at [facility] Discharge Instructions   You were seen and examined today by Dr. Delton Coombes.  He reviewed the results of your lab work which are normal/stable.   We will proceed with your treatment today. We will do chemotherapy in the clinic for a total of 4 months then proceed with the chemoradiation phase. You will be on chemo pills that you take twice a day. Radiation is daily Monday-Friday. We will see you weekly in the clinic to check your blood work during this time. After this you will go to surgery.   Return as scheduled in 2 weeks for lab work and treatment.       Thank you for choosing [place] at [facility] to provide your oncology and hematology care.  To afford each patient quality time with our provider, please arrive at least 15 minutes before your scheduled appointment time.   If you have a lab appointment with the [facility] please come in thru the Main Entrance and check in at the main information desk.  You need to re-schedule your appointment should you arrive 10 or more minutes late.  We strive to give you quality time with our providers, and arriving late affects you and other patients whose appointments are after yours.  Also, if you no show three or more times for appointments you may be dismissed from the clinic at the providers discretion.     Again, thank you for choosing [facility].  Our hope is that these requests will decrease the amount of time that you wait before being seen by our physicians.       _____________________________________________________________  Should you have questions after your visit to [facility], please contact our office at [phone] and follow the prompts.  Our office hours are 8:00 a.m. and 4:30 p.m. Monday - Friday.  Please note that voicemails left after 4:00 p.m. may not be returned until the following business day.  We are closed weekends  and major holidays.  You do have access to a nurse 24-7, just call the main number to the clinic [phone] and do not press any options, hold on the line and a nurse will answer the phone.    For prescription refill requests, have your pharmacy contact our office and allow 72 hours.    Due to Covid, you will need to wear a mask upon entering the hospital. If you do not have a mask, a mask will be given to you at the Main Entrance upon arrival. For doctor visits, patients may have 1 support person age 51 or older with them. For treatment visits, patients can not have anyone with them due to social distancing guidelines and our immunocompromised population.

## 2022-06-29 NOTE — Progress Notes (Signed)
Patient has been examined by Dr. Katragadda, and vital signs and labs have been reviewed. ANC, Creatinine, LFTs, hemoglobin, and platelets are within treatment parameters per M.D. - pt may proceed with treatment.  Primary RN and pharmacy notified.  

## 2022-06-29 NOTE — Progress Notes (Signed)
Nutrition Assessment   Reason for Assessment: Referral (newly diagnosed rectal cancer)   ASSESSMENT: 51 year old male with stage IIIB rectal cancer. He is receiving neoadjuvant chemotherapy with FOLOFOX q14d x 4 months followed by concurrent radiation with xeloda. Plans for adjuvant surgery. Patient is under the care of Dr. Delton Coombes.   No significant past medical history    Nutrition Focused Physical Exam:   Orbital Region: *** Buccal Region: *** Upper Arm Region: Copy and Lumbar Region: *** Temple Region: *** Clavicle Bone Region: *** Shoulder and Acromion Bone Region: *** Scapular Bone Region: *** Dorsal Hand: *** Patellar Region: *** Anterior Thigh Region: *** Posterior Calf Region: *** Edema (RD assessment): *** Hair: *** Eyes: *** Mouth: *** Skin: *** Nails: ***   Medications: compazine   Labs: glucose 114   Anthropometrics:   Height: 5'10" Weight: 179 lb 3.2 oz  UBW: 179 lb (03/23) BMI: 25.71    NUTRITION DIAGNOSIS: ***   MALNUTRITION DIAGNOSIS: ***   INTERVENTION: ***   MONITORING, EVALUATION, GOAL: ***   Next Visit: ***  ***

## 2022-06-29 NOTE — Progress Notes (Signed)
[address] [place], [place] [address]   CLINIC:  Medical Oncology/Hematology  PCP:  Practice, [facility] [place] [address] [phone]   REASON FOR VISIT:  Follow-up for stage III rectal cancer  PRIOR THERAPY: None  NGS Results: MSI-stable, MMR-preserved  CURRENT THERAPY: FOLFOX  BRIEF ONCOLOGIC HISTORY:  Oncology History  Rectal carcinoma ([place])  06/17/2022 Initial Diagnosis   Rectal carcinoma ([place])   06/29/2022 -  Chemotherapy   Patient is on Treatment Plan : COLORECTAL FOLFOX q14d x 4 months       CANCER STAGING:  Cancer Staging  Rectal carcinoma ([place]) Staging form: Colon and Rectum, AJCC 8th Edition - Clinical stage from 06/18/2022: Stage IIIB (cT3, cN1, cM0) - Unsigned    INTERVAL HISTORY:  Troy Dillon 51 y.o. male seen for follow-up of rectal adenocarcinoma.  He denies any baseline tingling or numbness in the extremities.  Energy levels are 100%.    REVIEW OF SYSTEMS:  Review of Systems  Psychiatric/Behavioral:  The patient is nervous/anxious.   All other systems reviewed and are negative.    PAST MEDICAL/SURGICAL HISTORY:  Past Medical History:  Diagnosis Date   Rectal bleeding    Rectal cancer ([place]) 06/08/2022   Past Surgical History:  Procedure Laterality Date   COLONOSCOPY  06/08/2022   EYE SURGERY     FRACTURE SURGERY     Right Femur- Cables in bone remain in place, rod removed in 2000   IR IMAGING GUIDED PORT INSERTION  06/22/2022   ORTHOPEDIC SURGERY       SOCIAL HISTORY:  Social History   Socioeconomic History   Marital status: Married    Spouse name: Not on file   Number of children: Not on file   Years of education: Not on file   Highest education level: Not on file  Occupational History   Not on file  Tobacco Use   Smoking status: Former    Types: Cigarettes    Quit date: 2003    Years since quitting: 20.8   Smokeless tobacco: Current    Types: Snuff   Tobacco  comments:    Some Dipping  Vaping Use   Vaping Use: Never used  Substance and Sexual Activity   Alcohol use: Yes   Drug use: Never   Sexual activity: Not on file  Other Topics Concern   Not on file  Social History Narrative   Not on file   Social Determinants of Health   Financial Resource Strain: Not on file  Food Insecurity: Not on file  Transportation Needs: Not on file  Physical Activity: Not on file  Stress: Not on file  Social Connections: Not on file  Intimate Partner Violence: Not on file    FAMILY HISTORY:  Family History  Problem Relation Age of Onset   Cancer Mother        GYN malignancy details unknown   Colon cancer Father    Heart attack Father    Colon cancer Paternal Aunt    Colon cancer Paternal Grandmother    Colon cancer Paternal Grandfather     CURRENT MEDICATIONS:  Outpatient Encounter Medications as of 06/29/2022  Medication Sig   Brinzolamide-Brimonidine 1-0.2 % SUSP    dextrose 5 % SOLN 1,000 mL with fluorouracil 5 GM/100ML SOLN Inject into the vein over 48 hr. Every 14 days   FLUOROURACIL IV Inject into the vein every 14 (fourteen) days.   ibuprofen (ADVIL) 200 MG tablet Take  200 mg by mouth every 6 (six) hours as needed for moderate pain.   LEUCOVORIN CALCIUM IV Inject into the vein every 14 (fourteen) days.   Multiple Vitamin (MULTIVITAMIN) tablet Take 1 tablet by mouth daily.   OXALIPLATIN IV Inject into the vein every 14 (fourteen) days.   Salicylic Acid, Acne, (SALICYLIC ACID EX) Apply 1 Application topically daily as needed (acne).   sodium chloride (OCEAN) 0.65 % SOLN nasal spray Place 1 spray into both nostrils as needed for congestion.   lidocaine-prilocaine (EMLA) cream Apply a small amount to port a cath site and cover with plastic wrap one hour prior to infusion appointments   prochlorperazine (COMPAZINE) 10 MG tablet Take 1 tablet (10 mg total) by mouth every 6 (six) hours as needed for nausea or vomiting.   Facility-Administered  Encounter Medications as of 06/29/2022  Medication   [COMPLETED] sodium chloride flush (NS) 0.9 % injection 10 mL    ALLERGIES:  Allergies  Allergen Reactions   Hydrocodone Itching   Latex     Eye irritation      PHYSICAL EXAM:  ECOG Performance status: 0  There were no vitals filed for this visit. There were no vitals filed for this visit. Physical Exam Vitals reviewed.  Constitutional:      Appearance: Normal appearance.  Cardiovascular:     Rate and Rhythm: Normal rate and regular rhythm.     Pulses: Normal pulses.     Heart sounds: Normal heart sounds.  Pulmonary:     Effort: Pulmonary effort is normal.     Breath sounds: Normal breath sounds.  Neurological:     Mental Status: He is alert.  Psychiatric:        Mood and Affect: Mood normal.        Behavior: Behavior normal.      LABORATORY DATA:  I have reviewed the labs as listed.  CBC    Component Value Date/Time   WBC 3.7 (L) 06/29/2022 0832   RBC 4.72 06/29/2022 0832   HGB 14.7 06/29/2022 0832   HCT 41.7 06/29/2022 0832   PLT 158 06/29/2022 0832   MCV 88.3 06/29/2022 0832   MCH 31.1 06/29/2022 0832   MCHC 35.3 06/29/2022 0832   RDW 11.6 06/29/2022 0832   LYMPHSABS 1.1 06/29/2022 0832   MONOABS 0.4 06/29/2022 0832   EOSABS 0.2 06/29/2022 0832   BASOSABS 0.1 06/29/2022 0832      Latest Ref Rng & Units 06/29/2022    8:32 AM 09/11/2021   11:28 AM  CMP  Glucose 70 - 99 mg/dL 114  95   BUN 6 - 20 mg/dL 16  13   Creatinine 0.61 - 1.24 mg/dL 1.03  1.16   Sodium 135 - 145 mmol/L 137  136   Potassium 3.5 - 5.1 mmol/L 3.7  3.9   Chloride 98 - 111 mmol/L 106  103   CO2 22 - 32 mmol/L 24  27   Calcium 8.9 - 10.3 mg/dL 8.5  8.8   Total Protein 6.5 - 8.1 g/dL 6.9    Total Bilirubin 0.3 - 1.2 mg/dL 1.1    Alkaline Phos 38 - 126 U/L 100    AST 15 - 41 U/L 20    ALT 0 - 44 U/L 19      DIAGNOSTIC IMAGING:  I have independently reviewed the scans and discussed with the patient.  ASSESSMENT:  1.   Stage IIIB (T3cN1) rectal adenocarcinoma, MSI stable: - Presentation with rectal bleeding - Colonoscopy (06/08/2022):  Fungating partially obstructing large mass found in the rectum 11 cm from anal verge.  Partially circumferential involving one half of the lumen.  Mass measured 3 cm in length.  Diameter 2 cm.  Oozing present.  1 semipedunculated polyp (40 cm from anus) and 3 sessile polyps in the cecum removed. - Pathology (06/08/2022): Rectal mass 11 cm from anal verge-adenocarcinoma.  MMR preserved.  MSI-stable. - CEA (06/08/2022): 2.9 - MRI pelvis (06/10/2022): T3CN1, tumor extends into sigmoid from the high rectum, extension through muscularis propria, approximately 6 mm beyond the rectal wall.  Tumor begins in the highly portion of the rectum and extends into the sigmoid colon.  Extramural vascular invasion/tumor thrombus along the right lateral margin.  Shortest distance of tumor from mesorectal fascia: 15 mm.  Single high mesorectal or superior rectal lymph node measuring 9 mm.  Distance from tumor to internal anal sphincter is approximately 7-11 cm. - CT CAP (06/10/2022): Circumferential wall thickening of the upper/mid rectum, mildly enlarged high perirectal lymph nodes measuring up to 6 mm in short axis.  No evidence of distant metastatic disease in the chest, abdomen or pelvis.   2.  Social/family history: - He lives at home with his wife Troy Dillon.  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  Denies any chemical exposure.  Does not smoke cigarettes but dips tobacco. - Father had colon cancer at age 61. - Paternal grandmother died of colon cancer. - Paternal aunt had colon cancer. - Mother had "male cancer" and underwent hysterectomy.    PLAN:  1.  Stage IIIb (T3CN1) rectal adenocarcinoma, MSI-stable: - We discussed updated pathology reports which showed MSI stable. - Hence we will proceed with TNT approach with chemotherapy followed by chemoradiation therapy. - Reviewed labs today  which showed normal LFTs and CBC with mild leukopenia and normal ANC. - He had port placed by IR. - We discussed the chemotherapy regimen FOLFOX in detail.  We discussed side effects in detail.  He will proceed with cycle 1 today without any dose reduction. - RTC 2 weeks for follow-up prior to cycle 2.      Orders placed this encounter:  No orders of the defined types were placed in this encounter.     Derek Jack, MD [facility] [phone]

## 2022-06-29 NOTE — Patient Instructions (Signed)
[facility]  Discharge Instructions: Thank you for choosing [facility] to provide your oncology and hematology care.  If you have a lab appointment with the [facility], please come in thru the Main Entrance and check in at the main information desk.  Wear comfortable clothing and clothing appropriate for easy access to any Portacath or PICC line.   We strive to give you quality time with your provider. You may need to reschedule your appointment if you arrive late (15 or more minutes).  Arriving late affects you and other patients whose appointments are after yours.  Also, if you miss three or more appointments without notifying the office, you may be dismissed from the clinic at the provider's discretion.      For prescription refill requests, have your pharmacy contact our office and allow 72 hours for refills to be completed.    Today you received the following chemotherapy and/or immunotherapy agents C1D1 Folfox   To help prevent nausea and vomiting after your treatment, we encourage you to take your nausea medication as directed.  Oxaliplatin Injection What is this medication? OXALIPLATIN (ox AL i PLA tin) treats some types of cancer. It works by slowing down the growth of cancer cells. This medicine may be used for other purposes; ask your health care provider or pharmacist if you have questions. COMMON BRAND NAME(S): Eloxatin What should I tell my care team before I take this medication? They need to know if you have any of these conditions: Heart disease History of irregular heartbeat or rhythm Liver disease Low blood cell levels (white cells, red cells, and platelets) Lung or breathing disease, such as asthma Take medications that treat or prevent blood clots Tingling of the fingers, toes, or other nerve disorder An unusual or allergic reaction to oxaliplatin, other medications, foods, dyes, or preservatives If you or your partner are  pregnant or trying to get pregnant Breast-feeding How should I use this medication? This medication is injected into a vein. It is given by your care team in a hospital or clinic setting. Talk to your care team about the use of this medication in children. Special care may be needed. Overdosage: If you think you have taken too much of this medicine contact a poison control center or emergency room at once. NOTE: This medicine is only for you. Do not share this medicine with others. What if I miss a dose? Keep appointments for follow-up doses. It is important not to miss a dose. Call your care team if you are unable to keep an appointment. What may interact with this medication? Do not take this medication with any of the following: Cisapride Dronedarone Pimozide Thioridazine This medication may also interact with the following: Aspirin and aspirin-like medications Certain medications that treat or prevent blood clots, such as warfarin, apixaban, dabigatran, and rivaroxaban Cisplatin Cyclosporine Diuretics Medications for infection, such as acyclovir, adefovir, amphotericin B, bacitracin, cidofovir, foscarnet, ganciclovir, gentamicin, pentamidine, vancomycin NSAIDs, medications for pain and inflammation, such as ibuprofen or naproxen Other medications that cause heart rhythm changes Pamidronate Zoledronic acid This list may not describe all possible interactions. Give your health care provider a list of all the medicines, herbs, non-prescription drugs, or dietary supplements you use. Also tell them if you smoke, drink alcohol, or use illegal drugs. Some items may interact with your medicine. What should I watch for while using this medication? Your condition will be monitored carefully while you are receiving this medication. You may  need blood work while taking this medication. This medication may make you feel generally unwell. This is not uncommon as chemotherapy can affect healthy  cells as well as cancer cells. Report any side effects. Continue your course of treatment even though you feel ill unless your care team tells you to stop. This medication may increase your risk of getting an infection. Call your care team for advice if you get a fever, chills, sore throat, or other symptoms of a cold or flu. Do not treat yourself. Try to avoid being around people who are sick. Avoid taking medications that contain aspirin, acetaminophen, ibuprofen, naproxen, or ketoprofen unless instructed by your care team. These medications may hide a fever. Be careful brushing or flossing your teeth or using a toothpick because you may get an infection or bleed more easily. If you have any dental work done, tell your dentist you are receiving this medication. This medication can make you more sensitive to cold. Do not drink cold drinks or use ice. Cover exposed skin before coming in contact with cold temperatures or cold objects. When out in cold weather wear warm clothing and cover your mouth and nose to warm the air that goes into your lungs. Tell your care team if you get sensitive to the cold. Talk to your care team if you or your partner are pregnant or think either of you might be pregnant. This medication can cause serious birth defects if taken during pregnancy and for 9 months after the last dose. A negative pregnancy test is required before starting this medication. A reliable form of contraception is recommended while taking this medication and for 9 months after the last dose. Talk to your care team about effective forms of contraception. Do not father a child while taking this medication and for 6 months after the last dose. Use a condom while having sex during this time period. Do not breastfeed while taking this medication and for 3 months after the last dose. This medication may cause infertility. Talk to your care team if you are concerned about your fertility. What side effects may I  notice from receiving this medication? Side effects that you should report to your care team as soon as possible: Allergic reactions--skin rash, itching, hives, swelling of the face, lips, tongue, or throat Bleeding--bloody or black, tar-like stools, vomiting blood or brown material that looks like coffee grounds, red or dark brown urine, small red or purple spots on skin, unusual bruising or bleeding Dry cough, shortness of breath or trouble breathing Heart rhythm changes--fast or irregular heartbeat, dizziness, feeling faint or lightheaded, chest pain, trouble breathing Infection--fever, chills, cough, sore throat, wounds that don't heal, pain or trouble when passing urine, general feeling of discomfort or being unwell Liver injury--right upper belly pain, loss of appetite, nausea, light-colored stool, dark yellow or brown urine, yellowing skin or eyes, unusual weakness or fatigue Low red blood cell level--unusual weakness or fatigue, dizziness, headache, trouble breathing Muscle injury--unusual weakness or fatigue, muscle pain, dark yellow or brown urine, decrease in amount of urine Pain, tingling, or numbness in the hands or feet Sudden and severe headache, confusion, change in vision, seizures, which may be signs of posterior reversible encephalopathy syndrome (PRES) Unusual bruising or bleeding Side effects that usually do not require medical attention (report to your care team if they continue or are bothersome): Diarrhea Nausea Pain, redness, or swelling with sores inside the mouth or throat Unusual weakness or fatigue Vomiting This list may not describe  all possible side effects. Call your doctor for medical advice about side effects. You may report side effects to FDA at 1-800-FDA-1088. Where should I keep my medication? This medication is given in a hospital or clinic. It will not be stored at home. NOTE: This sheet is a summary. It may not cover all possible information. If you have  questions about this medicine, talk to your doctor, pharmacist, or health care provider.  2023 Elsevier/Gold Standard (2007-10-01 00:00:00)  Leucovorin Injection What is this medication? LEUCOVORIN (loo koe VOR in) prevents side effects from certain medications, such as methotrexate. It works by increasing folate levels. This helps protect healthy cells in your body. It may also be used to treat anemia caused by low levels of folate. It can also be used with fluorouracil, a type of chemotherapy, to treat colorectal cancer. It works by increasing the effects of fluorouracil in the body. This medicine may be used for other purposes; ask your health care provider or pharmacist if you have questions. What should I tell my care team before I take this medication? They need to know if you have any of these conditions: Anemia from low levels of vitamin B12 in the blood An unusual or allergic reaction to leucovorin, folic acid, other medications, foods, dyes, or preservatives Pregnant or trying to get pregnant Breastfeeding How should I use this medication? This medication is injected into a vein or a muscle. It is given by your care team in a hospital or clinic setting. Talk to your care team about the use of this medication in children. Special care may be needed. Overdosage: If you think you have taken too much of this medicine contact a poison control center or emergency room at once. NOTE: This medicine is only for you. Do not share this medicine with others. What if I miss a dose? Keep appointments for follow-up doses. It is important not to miss your dose. Call your care team if you are unable to keep an appointment. What may interact with this medication? Capecitabine Fluorouracil Phenobarbital Phenytoin Primidone Trimethoprim;sulfamethoxazole This list may not describe all possible interactions. Give your health care provider a list of all the medicines, herbs, non-prescription drugs, or  dietary supplements you use. Also tell them if you smoke, drink alcohol, or use illegal drugs. Some items may interact with your medicine. What should I watch for while using this medication? Your condition will be monitored carefully while you are receiving this medication. This medication may increase the side effects of 5-fluorouracil. Tell your care team if you have diarrhea or mouth sores that do not get better or that get worse. What side effects may I notice from receiving this medication? Side effects that you should report to your care team as soon as possible: Allergic reactions--skin rash, itching, hives, swelling of the face, lips, tongue, or throat This list may not describe all possible side effects. Call your doctor for medical advice about side effects. You may report side effects to FDA at 1-800-FDA-1088. Where should I keep my medication? This medication is given in a hospital or clinic. It will not be stored at home. NOTE: This sheet is a summary. It may not cover all possible information. If you have questions about this medicine, talk to your doctor, pharmacist, or health care provider.  2023 Elsevier/Gold Standard (2022-01-13 00:00:00)  Fluorouracil Injection What is this medication? FLUOROURACIL (flure oh YOOR a sil) treats some types of cancer. It works by slowing down the growth of cancer   cells. This medicine may be used for other purposes; ask your health care provider or pharmacist if you have questions. COMMON BRAND NAME(S): Adrucil What should I tell my care team before I take this medication? They need to know if you have any of these conditions: Blood disorders Dihydropyrimidine dehydrogenase (DPD) deficiency Infection, such as chickenpox, cold sores, herpes Kidney disease Liver disease Poor nutrition Recent or ongoing radiation therapy An unusual or allergic reaction to fluorouracil, other medications, foods, dyes, or preservatives If you or your partner  are pregnant or trying to get pregnant Breast-feeding How should I use this medication? This medication is injected into a vein. It is administered by your care team in a hospital or clinic setting. Talk to your care team about the use of this medication in children. Special care may be needed. Overdosage: If you think you have taken too much of this medicine contact a poison control center or emergency room at once. NOTE: This medicine is only for you. Do not share this medicine with others. What if I miss a dose? Keep appointments for follow-up doses. It is important not to miss your dose. Call your care team if you are unable to keep an appointment. What may interact with this medication? Do not take this medication with any of the following: Live virus vaccines This medication may also interact with the following: Medications that treat or prevent blood clots, such as warfarin, enoxaparin, dalteparin This list may not describe all possible interactions. Give your health care provider a list of all the medicines, herbs, non-prescription drugs, or dietary supplements you use. Also tell them if you smoke, drink alcohol, or use illegal drugs. Some items may interact with your medicine. What should I watch for while using this medication? Your condition will be monitored carefully while you are receiving this medication. This medication may make you feel generally unwell. This is not uncommon as chemotherapy can affect healthy cells as well as cancer cells. Report any side effects. Continue your course of treatment even though you feel ill unless your care team tells you to stop. In some cases, you may be given additional medications to help with side effects. Follow all directions for their use. This medication may increase your risk of getting an infection. Call your care team for advice if you get a fever, chills, sore throat, or other symptoms of a cold or flu. Do not treat yourself. Try to  avoid being around people who are sick. This medication may increase your risk to bruise or bleed. Call your care team if you notice any unusual bleeding. Be careful brushing or flossing your teeth or using a toothpick because you may get an infection or bleed more easily. If you have any dental work done, tell your dentist you are receiving this medication. Avoid taking medications that contain aspirin, acetaminophen, ibuprofen, naproxen, or ketoprofen unless instructed by your care team. These medications may hide a fever. Do not treat diarrhea with over the counter products. Contact your care team if you have diarrhea that lasts more than 2 days or if it is severe and watery. This medication can make you more sensitive to the sun. Keep out of the sun. If you cannot avoid being in the sun, wear protective clothing and sunscreen. Do not use sun lamps, tanning beds, or tanning booths. Talk to your care team if you or your partner wish to become pregnant or think you might be pregnant. This medication can cause serious birth defects  if taken during pregnancy and for 3 months after the last dose. A reliable form of contraception is recommended while taking this medication and for 3 months after the last dose. Talk to your care team about effective forms of contraception. Do not father a child while taking this medication and for 3 months after the last dose. Use a condom while having sex during this time period. Do not breastfeed while taking this medication. This medication may cause infertility. Talk to your care team if you are concerned about your fertility. What side effects may I notice from receiving this medication? Side effects that you should report to your care team as soon as possible: Allergic reactions--skin rash, itching, hives, swelling of the face, lips, tongue, or throat Heart attack--pain or tightness in the chest, shoulders, arms, or jaw, nausea, shortness of breath, cold or clammy  skin, feeling faint or lightheaded Heart failure--shortness of breath, swelling of the ankles, feet, or hands, sudden weight gain, unusual weakness or fatigue Heart rhythm changes--fast or irregular heartbeat, dizziness, feeling faint or lightheaded, chest pain, trouble breathing High ammonia level--unusual weakness or fatigue, confusion, loss of appetite, nausea, vomiting, seizures Infection--fever, chills, cough, sore throat, wounds that don't heal, pain or trouble when passing urine, general feeling of discomfort or being unwell Low red blood cell level--unusual weakness or fatigue, dizziness, headache, trouble breathing Pain, tingling, or numbness in the hands or feet, muscle weakness, change in vision, confusion or trouble speaking, loss of balance or coordination, trouble walking, seizures Redness, swelling, and blistering of the skin over hands and feet Severe or prolonged diarrhea Unusual bruising or bleeding Side effects that usually do not require medical attention (report to your care team if they continue or are bothersome): Dry skin Headache Increased tears Nausea Pain, redness, or swelling with sores inside the mouth or throat Sensitivity to light Vomiting This list may not describe all possible side effects. Call your doctor for medical advice about side effects. You may report side effects to FDA at 1-800-FDA-1088. Where should I keep my medication? This medication is given in a hospital or clinic. It will not be stored at home. NOTE: This sheet is a summary. It may not cover all possible information. If you have questions about this medicine, talk to your doctor, pharmacist, or health care provider.  2023 Elsevier/Gold Standard (2021-12-09 00:00:00)     BELOW ARE SYMPTOMS THAT SHOULD BE REPORTED IMMEDIATELY: *FEVER GREATER THAN 100.4 F (38 C) OR HIGHER *CHILLS OR SWEATING *NAUSEA AND VOMITING THAT IS NOT CONTROLLED WITH YOUR NAUSEA MEDICATION *UNUSUAL SHORTNESS OF  BREATH *UNUSUAL BRUISING OR BLEEDING *URINARY PROBLEMS (pain or burning when urinating, or frequent urination) *BOWEL PROBLEMS (unusual diarrhea, constipation, pain near the anus) TENDERNESS IN MOUTH AND THROAT WITH OR WITHOUT PRESENCE OF ULCERS (sore throat, sores in mouth, or a toothache) UNUSUAL RASH, SWELLING OR PAIN  UNUSUAL VAGINAL DISCHARGE OR ITCHING   Items with * indicate a potential emergency and should be followed up as soon as possible or go to the Emergency Department if any problems should occur.  Please show the CHEMOTHERAPY ALERT CARD or IMMUNOTHERAPY ALERT CARD at check-in to the Emergency Department and triage nurse.  Should you have questions after your visit or need to cancel or reschedule your appointment, please contact [facility] [phone]  and follow the prompts.  Office hours are 8:00 a.m. to 4:30 p.m. Monday - Friday. Please note that voicemails left after 4:00 p.m. may not be returned until the  following business day.  We are closed weekends and major holidays. You have access to a nurse at all times for urgent questions. Please call the main number to the clinic [phone] and follow the prompts.  For any non-urgent questions, you may also contact your provider using MyChart. We now offer e-Visits for anyone 53 and older to request care online for non-urgent symptoms. For details visit [email or web address].   Also download the MyChart app! Go to the app store, search "MyChart", open the app, select [facility], and log in with your MyChart username and password.  Masks are optional in the cancer centers. If you would like for your care team to wear a mask while they are taking care of you, please let them know. You may have one support person who is at least 51 years old accompany you for your appointments.

## 2022-06-30 ENCOUNTER — Encounter: Payer: Self-pay | Admitting: Hematology

## 2022-07-01 ENCOUNTER — Inpatient Hospital Stay: Payer: 59

## 2022-07-01 ENCOUNTER — Encounter: Payer: Self-pay | Admitting: *Deleted

## 2022-07-01 VITALS — BP 122/70 | HR 62 | Temp 97.6°F | Resp 18

## 2022-07-01 DIAGNOSIS — Z95828 Presence of other vascular implants and grafts: Secondary | ICD-10-CM

## 2022-07-01 DIAGNOSIS — Z5111 Encounter for antineoplastic chemotherapy: Secondary | ICD-10-CM | POA: Diagnosis not present

## 2022-07-01 DIAGNOSIS — C20 Malignant neoplasm of rectum: Secondary | ICD-10-CM

## 2022-07-01 DIAGNOSIS — Z5189 Encounter for other specified aftercare: Secondary | ICD-10-CM | POA: Diagnosis not present

## 2022-07-01 DIAGNOSIS — Z79899 Other long term (current) drug therapy: Secondary | ICD-10-CM | POA: Diagnosis not present

## 2022-07-01 MED ORDER — SODIUM CHLORIDE 0.9% FLUSH
10.0000 mL | INTRAVENOUS | Status: DC | PRN
  Administered 2022-07-01: 10 mL

## 2022-07-01 MED ORDER — HEPARIN SOD (PORK) LOCK FLUSH 100 UNIT/ML IV SOLN
500.0000 [IU] | Freq: Once | INTRAVENOUS | Status: AC | PRN
  Administered 2022-07-01: 500 [IU]

## 2022-07-01 NOTE — Patient Instructions (Signed)
[facility]  Discharge Instructions: Thank you for choosing [facility] to provide your oncology and hematology care.  If you have a lab appointment with the [facility], please come in thru the Main Entrance and check in at the main information desk.  Wear comfortable clothing and clothing appropriate for easy access to any Portacath or PICC line.   We strive to give you quality time with your provider. You may need to reschedule your appointment if you arrive late (15 or more minutes).  Arriving late affects you and other patients whose appointments are after yours.  Also, if you miss three or more appointments without notifying the office, you may be dismissed from the clinic at the provider's discretion.      For prescription refill requests, have your pharmacy contact our office and allow 72 hours for refills to be completed.    Today you received the following chemotherapy and/or immunotherapy agents Pump stop      To help prevent nausea and vomiting after your treatment, we encourage you to take your nausea medication as directed.  BELOW ARE SYMPTOMS THAT SHOULD BE REPORTED IMMEDIATELY: *FEVER GREATER THAN 100.4 F (38 C) OR HIGHER *CHILLS OR SWEATING *NAUSEA AND VOMITING THAT IS NOT CONTROLLED WITH YOUR NAUSEA MEDICATION *UNUSUAL SHORTNESS OF BREATH *UNUSUAL BRUISING OR BLEEDING *URINARY PROBLEMS (pain or burning when urinating, or frequent urination) *BOWEL PROBLEMS (unusual diarrhea, constipation, pain near the anus) TENDERNESS IN MOUTH AND THROAT WITH OR WITHOUT PRESENCE OF ULCERS (sore throat, sores in mouth, or a toothache) UNUSUAL RASH, SWELLING OR PAIN  UNUSUAL VAGINAL DISCHARGE OR ITCHING   Items with * indicate a potential emergency and should be followed up as soon as possible or go to the Emergency Department if any problems should occur.  Please show the CHEMOTHERAPY ALERT CARD or IMMUNOTHERAPY ALERT CARD at check-in to the  Emergency Department and triage nurse.  Should you have questions after your visit or need to cancel or reschedule your appointment, please contact [facility] [phone]  and follow the prompts.  Office hours are 8:00 a.m. to 4:30 p.m. Monday - Friday. Please note that voicemails left after 4:00 p.m. may not be returned until the following business day.  We are closed weekends and major holidays. You have access to a nurse at all times for urgent questions. Please call the main number to the clinic [phone] and follow the prompts.  For any non-urgent questions, you may also contact your provider using MyChart. We now offer e-Visits for anyone 52 and older to request care online for non-urgent symptoms. For details visit [email or web address].   Also download the MyChart app! Go to the app store, search "MyChart", open the app, select [facility], and log in with your MyChart username and password.  Masks are optional in the cancer centers. If you would like for your care team to wear a mask while they are taking care of you, please let them know. You may have one support person who is at least 51 years old accompany you for your appointments.

## 2022-07-01 NOTE — Progress Notes (Signed)
Patient presents today for 5FU pump stop and disconnection after 46 hour continous infusion.   5FU pump deaccessed.  Patients port flushed without difficulty.  Good blood return noted with no bruising or swelling noted at site.  Needle removed intact.  Band aid applied.  VSS with discharge and left in satisfactory condition via wheelchair with no s/s of distress noted.    

## 2022-07-02 ENCOUNTER — Telehealth: Payer: Self-pay

## 2022-07-02 ENCOUNTER — Other Ambulatory Visit: Payer: Self-pay | Admitting: *Deleted

## 2022-07-02 MED ORDER — ONDANSETRON HCL 8 MG PO TABS: 8.0000 mg | ORAL_TABLET | Freq: Three times a day (TID) | ORAL | 0 refills | Status: DC | PRN

## 2022-07-02 NOTE — Progress Notes (Signed)
Patient sent [facility] stating that compazine was not effective and that liquids were even difficult to ingest due to severe nausea.  Sent in zofran 8 mg Q 8 prn.  Will let us know it this does not work for him.

## 2022-07-02 NOTE — Telephone Encounter (Signed)
Patient called for 24hr follow up, patient reports nausea, new Zofran prescription was called in to patient's pharmacy however patient stated his wife was going to pick it up and he has not taken zofran yet. Patient also reports constipation, he states he has not taken anything for constipation yet, he is going to take something over the counter. RN informed patient to call cancer center if symptoms do not subside, patient verbalized understanding.

## 2022-07-02 NOTE — Telephone Encounter (Signed)
Patient

## 2022-07-07 ENCOUNTER — Ambulatory Visit: Payer: No Typology Code available for payment source

## 2022-07-07 ENCOUNTER — Other Ambulatory Visit: Payer: Self-pay | Admitting: Hematology

## 2022-07-07 DIAGNOSIS — H401131 Primary open-angle glaucoma, bilateral, mild stage: Secondary | ICD-10-CM | POA: Diagnosis not present

## 2022-07-11 ENCOUNTER — Other Ambulatory Visit: Payer: Self-pay

## 2022-07-13 ENCOUNTER — Inpatient Hospital Stay: Payer: 59

## 2022-07-13 ENCOUNTER — Inpatient Hospital Stay: Payer: 59 | Admitting: Dietician

## 2022-07-13 ENCOUNTER — Inpatient Hospital Stay (HOSPITAL_BASED_OUTPATIENT_CLINIC_OR_DEPARTMENT_OTHER): Payer: 59 | Admitting: Hematology

## 2022-07-13 VITALS — BP 125/81 | HR 69 | Temp 98.0°F | Resp 18

## 2022-07-13 DIAGNOSIS — C20 Malignant neoplasm of rectum: Secondary | ICD-10-CM

## 2022-07-13 DIAGNOSIS — Z95828 Presence of other vascular implants and grafts: Secondary | ICD-10-CM

## 2022-07-13 DIAGNOSIS — Z5111 Encounter for antineoplastic chemotherapy: Secondary | ICD-10-CM | POA: Diagnosis not present

## 2022-07-13 DIAGNOSIS — Z5189 Encounter for other specified aftercare: Secondary | ICD-10-CM | POA: Diagnosis not present

## 2022-07-13 DIAGNOSIS — Z79899 Other long term (current) drug therapy: Secondary | ICD-10-CM | POA: Diagnosis not present

## 2022-07-13 LAB — CBC WITH DIFFERENTIAL/PLATELET
Abs Immature Granulocytes: 0 10*3/uL (ref 0.00–0.07)
Basophils Absolute: 0.1 10*3/uL (ref 0.0–0.1)
Basophils Relative: 2 %
Eosinophils Absolute: 0.2 10*3/uL (ref 0.0–0.5)
Eosinophils Relative: 7 %
HCT: 40.1 % (ref 39.0–52.0)
Hemoglobin: 14.7 g/dL (ref 13.0–17.0)
Immature Granulocytes: 0 %
Lymphocytes Relative: 40 %
Lymphs Abs: 1.2 10*3/uL (ref 0.7–4.0)
MCH: 32.1 pg (ref 26.0–34.0)
MCHC: 36.7 g/dL — ABNORMAL HIGH (ref 30.0–36.0)
MCV: 87.6 fL (ref 80.0–100.0)
Monocytes Absolute: 0.3 10*3/uL (ref 0.1–1.0)
Monocytes Relative: 9 %
Neutro Abs: 1.3 10*3/uL — ABNORMAL LOW (ref 1.7–7.7)
Neutrophils Relative %: 42 %
Platelets: 112 10*3/uL — ABNORMAL LOW (ref 150–400)
RBC: 4.58 MIL/uL (ref 4.22–5.81)
RDW: 12.3 % (ref 11.5–15.5)
WBC: 2.9 10*3/uL — ABNORMAL LOW (ref 4.0–10.5)
nRBC: 0 % (ref 0.0–0.2)

## 2022-07-13 LAB — COMPREHENSIVE METABOLIC PANEL
ALT: 22 U/L (ref 0–44)
AST: 25 U/L (ref 15–41)
Albumin: 3.6 g/dL (ref 3.5–5.0)
Alkaline Phosphatase: 105 U/L (ref 38–126)
Anion gap: 7 (ref 5–15)
BUN: 16 mg/dL (ref 6–20)
CO2: 23 mmol/L (ref 22–32)
Calcium: 8.3 mg/dL — ABNORMAL LOW (ref 8.9–10.3)
Chloride: 108 mmol/L (ref 98–111)
Creatinine, Ser: 1.04 mg/dL (ref 0.61–1.24)
GFR, Estimated: >60 mL/min (ref >60)
Glucose, Bld: 148 mg/dL — ABNORMAL HIGH (ref 70–99)
Potassium: 3.5 mmol/L (ref 3.5–5.1)
Sodium: 138 mmol/L (ref 135–145)
Total Bilirubin: 1 mg/dL (ref 0.3–1.2)
Total Protein: 6.5 g/dL (ref 6.5–8.1)

## 2022-07-13 MED ORDER — OXALIPLATIN CHEMO INJECTION 100 MG/20ML
85.0000 mg/m2 | Freq: Once | INTRAVENOUS | Status: AC
  Administered 2022-07-13: 170 mg via INTRAVENOUS
  Filled 2022-07-13: qty 34

## 2022-07-13 MED ORDER — DEXTROSE 5 % IV SOLN: Freq: Once | INTRAVENOUS | Status: AC

## 2022-07-13 MED ORDER — PALONOSETRON HCL INJECTION 0.25 MG/5ML
0.2500 mg | Freq: Once | INTRAVENOUS | Status: AC
  Administered 2022-07-13: 0.25 mg via INTRAVENOUS
  Filled 2022-07-13: qty 5

## 2022-07-13 MED ORDER — PROMETHAZINE HCL 25 MG RE SUPP: 25.0000 mg | Freq: Four times a day (QID) | RECTAL | 2 refills | Status: DC | PRN

## 2022-07-13 MED ORDER — LEUCOVORIN CALCIUM INJECTION 350 MG
400.0000 mg/m2 | Freq: Once | INTRAVENOUS | Status: AC
  Administered 2022-07-13: 800 mg via INTRAVENOUS
  Filled 2022-07-13: qty 40

## 2022-07-13 MED ORDER — SODIUM CHLORIDE 0.9 % IV SOLN
150.0000 mg | Freq: Once | INTRAVENOUS | Status: AC
  Administered 2022-07-13: 150 mg via INTRAVENOUS
  Filled 2022-07-13: qty 150

## 2022-07-13 MED ORDER — SODIUM CHLORIDE 0.9 % IV SOLN
10.0000 mg | Freq: Once | INTRAVENOUS | Status: AC
  Administered 2022-07-13: 10 mg via INTRAVENOUS
  Filled 2022-07-13: qty 10

## 2022-07-13 MED ORDER — SODIUM CHLORIDE 0.9 % IV SOLN
5000.0000 mg | INTRAVENOUS | Status: DC
  Administered 2022-07-13: 5000 mg via INTRAVENOUS
  Filled 2022-07-13: qty 100

## 2022-07-13 MED ORDER — FLUOROURACIL CHEMO INJECTION 2.5 GM/50ML
400.0000 mg/m2 | Freq: Once | INTRAVENOUS | Status: AC
  Administered 2022-07-13: 800 mg via INTRAVENOUS
  Filled 2022-07-13: qty 16

## 2022-07-13 NOTE — Progress Notes (Signed)
Received orders to add:  Emend 150 mg IVPB as premedication and Pegfilgrastim 6 mg SQ to pump DC day.  T.O. Dr Rhys Martini, PharmD

## 2022-07-13 NOTE — Progress Notes (Signed)
Patient has been examined by Dr. Delton Coombes, and vital signs and labs have been reviewed. ANC (1.3), Creatinine, LFTs, hemoglobin, and platelets are within treatment parameters per M.D. - pt may proceed with treatment.  Adding Emend to pre-meds per MD. Adding GCSF to day 3 w/pump d/c per MD. Primary RN and pharmacy notified.

## 2022-07-13 NOTE — Progress Notes (Signed)
[place], [place] [address]   CLINIC:  Medical Oncology/Hematology  PCP:  Practice, [facility] [place] [address] [phone]   REASON FOR VISIT:  Follow-up for stage III rectal cancer  PRIOR THERAPY: None  NGS Results: MSI-stable, MMR-preserved  CURRENT THERAPY: FOLFOX  BRIEF ONCOLOGIC HISTORY:  Oncology History  Rectal carcinoma ([place])  06/17/2022 Initial Diagnosis   Rectal carcinoma ([place])   06/29/2022 -  Chemotherapy   Patient is on Treatment Plan : COLORECTAL FOLFOX q14d x 4 months       CANCER STAGING:  Cancer Staging  Rectal carcinoma ([place]) Staging form: Colon and Rectum, AJCC 8th Edition - Clinical stage from 06/18/2022: Stage IIIB (cT3, cN1, cM0) - Unsigned    INTERVAL HISTORY:  Troy Dillon 51 y.o. male seen for follow-up of rectal cancer.  First cycle of FOLFOX was on 06/29/2022.  He reported having a nausea which started on evening of day 3 and lasted day 4.  Compazine did not help on day 4 and day 5.  We have called in Zofran which she alternated with Compazine.  On day 6 he started feeling better.  Denied any tingling or numbness.  Denied any cold sensitivity.  He had some soreness in the mouth which also improved.    REVIEW OF SYSTEMS:  Review of Systems  All other systems reviewed and are negative.    PAST MEDICAL/SURGICAL HISTORY:  Past Medical History:  Diagnosis Date   Rectal bleeding    Rectal cancer ([place]) 06/08/2022   Past Surgical History:  Procedure Laterality Date   COLONOSCOPY  06/08/2022   EYE SURGERY     FRACTURE SURGERY     Right Femur- Cables in bone remain in place, rod removed in 2000   IR IMAGING GUIDED PORT INSERTION  06/22/2022   ORTHOPEDIC SURGERY       SOCIAL HISTORY:  Social History   Socioeconomic History   Marital status: Married    Spouse name: Not on file   Number of children: Not on file   Years of education: Not on file   Highest education level:  Not on file  Occupational History   Not on file  Tobacco Use   Smoking status: Former    Types: Cigarettes    Quit date: 2003    Years since quitting: 20.8   Smokeless tobacco: Current    Types: Snuff   Tobacco comments:    Some Dipping  Vaping Use   Vaping Use: Never used  Substance and Sexual Activity   Alcohol use: Yes   Drug use: Never   Sexual activity: Not on file  Other Topics Concern   Not on file  Social History Narrative   Not on file   Social Determinants of Health   Financial Resource Strain: Not on file  Food Insecurity: Not on file  Transportation Needs: Not on file  Physical Activity: Not on file  Stress: Not on file  Social Connections: Not on file  Intimate Partner Violence: Not on file    FAMILY HISTORY:  Family History  Problem Relation Age of Onset   Cancer Mother        GYN malignancy details unknown   Colon cancer Father    Heart attack Father    Colon cancer Paternal Aunt    Colon cancer Paternal Grandmother    Colon cancer Paternal Grandfather     CURRENT MEDICATIONS:  Outpatient Encounter Medications as of 07/13/2022  Medication Sig   Brinzolamide-Brimonidine 1-0.2 % SUSP    dextrose 5 % SOLN 1,000 mL with fluorouracil 5 GM/100ML SOLN Inject into the vein over 48 hr. Every 14 days   FLUOROURACIL IV Inject into the vein every 14 (fourteen) days.   ibuprofen (ADVIL) 200 MG tablet Take 200 mg by mouth every 6 (six) hours as needed for moderate pain.   LEUCOVORIN CALCIUM IV Inject into the vein every 14 (fourteen) days.   lidocaine-prilocaine (EMLA) cream Apply a small amount to port a cath site and cover with plastic wrap one hour prior to infusion appointments   Multiple Vitamin (MULTIVITAMIN) tablet Take 1 tablet by mouth daily.   ondansetron (ZOFRAN) 8 MG tablet TAKE 1 TABLET BY MOUTH EVERY 8 HOURS AS NEEDED FOR NAUSEA OR VOMITING.   OXALIPLATIN IV Inject into the vein every 14 (fourteen) days.   prochlorperazine (COMPAZINE) 10 MG  tablet Take 1 tablet (10 mg total) by mouth every 6 (six) hours as needed for nausea or vomiting.   Salicylic Acid, Acne, (SALICYLIC ACID EX) Apply 1 Application topically daily as needed (acne).   sodium chloride (OCEAN) 0.65 % SOLN nasal spray Place 1 spray into both nostrils as needed for congestion.   No facility-administered encounter medications on file as of 07/13/2022.    ALLERGIES:  Allergies  Allergen Reactions   Hydrocodone Itching   Latex     Eye irritation      PHYSICAL EXAM:  ECOG Performance status: 0  There were no vitals filed for this visit. There were no vitals filed for this visit. Physical Exam Vitals reviewed.  Constitutional:      Appearance: Normal appearance.  Cardiovascular:     Rate and Rhythm: Normal rate and regular rhythm.     Pulses: Normal pulses.     Heart sounds: Normal heart sounds.  Pulmonary:     Effort: Pulmonary effort is normal.     Breath sounds: Normal breath sounds.  Neurological:     Mental Status: He is alert.  Psychiatric:        Mood and Affect: Mood normal.        Behavior: Behavior normal.     LABORATORY DATA:  I have reviewed the labs as listed.  CBC    Component Value Date/Time   WBC 3.7 (L) 06/29/2022 0832   RBC 4.72 06/29/2022 0832   HGB 14.7 06/29/2022 0832   HCT 41.7 06/29/2022 0832   PLT 158 06/29/2022 0832   MCV 88.3 06/29/2022 0832   MCH 31.1 06/29/2022 0832   MCHC 35.3 06/29/2022 0832   RDW 11.6 06/29/2022 0832   LYMPHSABS 1.1 06/29/2022 0832   MONOABS 0.4 06/29/2022 0832   EOSABS 0.2 06/29/2022 0832   BASOSABS 0.1 06/29/2022 0832      Latest Ref Rng & Units 06/29/2022    8:32 AM 09/11/2021   11:28 AM  CMP  Glucose 70 - 99 mg/dL 114  95   BUN 6 - 20 mg/dL 16  13   Creatinine 0.61 - 1.24 mg/dL 1.03  1.16   Sodium 135 - 145 mmol/L 137  136   Potassium 3.5 - 5.1 mmol/L 3.7  3.9   Chloride 98 - 111 mmol/L 106  103   CO2 22 - 32 mmol/L 24  27   Calcium 8.9 - 10.3 mg/dL 8.5  8.8   Total Protein  6.5 - 8.1 g/dL 6.9    Total Bilirubin 0.3 - 1.2 mg/dL 1.1    Alkaline Phos 38 -  126 U/L 100    AST 15 - 41 U/L 20    ALT 0 - 44 U/L 19      DIAGNOSTIC IMAGING:  I have independently reviewed the scans and discussed with the patient.  ASSESSMENT:  1.  Stage IIIB (T3cN1) rectal adenocarcinoma, MSI stable: - Presentation with rectal bleeding - Colonoscopy (06/08/2022): Fungating partially obstructing large mass found in the rectum 11 cm from anal verge.  Partially circumferential involving one half of the lumen.  Mass measured 3 cm in length.  Diameter 2 cm.  Oozing present.  1 semipedunculated polyp (40 cm from anus) and 3 sessile polyps in the cecum removed. - Pathology (06/08/2022): Rectal mass 11 cm from anal verge-adenocarcinoma.  MMR preserved.  MSI-stable. - CEA (06/08/2022): 2.9 - MRI pelvis (06/10/2022): T3CN1, tumor extends into sigmoid from the high rectum, extension through muscularis propria, approximately 6 mm beyond the rectal wall.  Tumor begins in the highly portion of the rectum and extends into the sigmoid colon.  Extramural vascular invasion/tumor thrombus along the right lateral margin.  Shortest distance of tumor from mesorectal fascia: 15 mm.  Single high mesorectal or superior rectal lymph node measuring 9 mm.  Distance from tumor to internal anal sphincter is approximately 7-11 cm. - CT CAP (06/10/2022): Circumferential wall thickening of the upper/mid rectum, mildly enlarged high perirectal lymph nodes measuring up to 6 mm in short axis.  No evidence of distant metastatic disease in the chest, abdomen or pelvis. - Neoadjuvant FOLFOX cycle 1 on 06/29/2022   2.  Social/family history: - He lives at home with his wife Lattie Haw.  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  Denies any chemical exposure.  Does not smoke cigarettes but dips tobacco. - Father had colon cancer at age 5. - Paternal grandmother died of colon cancer. - Paternal aunt had colon cancer. - Mother had  "male cancer" and underwent hysterectomy.    PLAN:  1.  Stage IIIb (T3CN1) rectal adenocarcinoma, MSI-stable: - He had some problems with nausea and vomiting 2 to 3 days after the pump discontinued.  He had missed 2 days of work. - Reviewed labs today which showed normal LFTs.  CBC shows white count is 2.9 with platelet count 112.  ANC was 1.3. - He will proceed with cycle 2 today.  I will add long-acting G-CSF on day 3.  He was instructed to take Claritin to decrease the bone pains. - RTC 2 weeks for follow-up for cycle 3.  2.  Nausea/vomiting: - He has experienced nausea which started on day 3 and lasted up to day 5.  Compazine did not help.  He took Zofran 8 mg in the morning, Compazine at lunch and Zofran again at 4 PM which helped. - We will add Emend to the regimen. - We have sent Phenergan suppositories every 6 hours as needed.      Orders placed this encounter:  No orders of the defined types were placed in this encounter.     Derek Jack, MD [place] [phone]

## 2022-07-13 NOTE — Progress Notes (Signed)
Nutrition Follow-up:  Patient with stage IIIB rectal cancer. He is receiving neoadjuvant chemotherapy with FOLOFOX q14d x 4 months (first 11/6) followed by concurrent radiation with xeloda. Plans for adjuvant surgery.  Met with patient and wife during infusion. He reports 3 days of nausea with episodes of vomiting that began after pump disconnect. Patient had mild cold sensitivity for ~2 days. He endorses appetite has been good and eating well over the last week. Patient has altered taste with water. This taste horrible and unable to drink currently. He has been craving coke in a can, recalls 3-4 cans/day as well as sweet tea. Patient reports bowels moving every 2 days. He will start taking daily stool softener x 2.    Medications: reviewed   Labs: glucose 148, Ca 8.3  Anthropometrics: Wt 179 lb today stable  11/6 - 179 lb 3.2 oz    NUTRITION DIAGNOSIS: Food and nutrition related knowledge deficit improving   INTERVENTION:  Continue low residue diet  Encouraged small frequent meals and snacks with adequate calories and protein Reinforced importance of hydration, suggested trying flavoring drops for water - pt did not care of Pedialyte hydration packets Bowel regimen per MD Discussed strategies for nausea, foods best tolerated and foods to limit, encouraged taking nausea medication as prescribed     MONITORING, EVALUATION, GOAL: weight trends, intake   NEXT VISIT: Monday December 4 during infusion

## 2022-07-13 NOTE — Progress Notes (Signed)
Patient presents today for chemotherapy infusion.  Patient is in satisfactory condition with no complaints voiced.  Vital signs are stable.  Labs reviewed by Dr. Delton Coombes during his office visit.  ANC today is 1.3.  All other labs are within treatment parameters. We will proceed with treatment per MD orders.   Patient tolerated treatment well with no complaints voiced. Home infusion 5FU pump connected.  Patient left ambulatory in stable condition.  Vital signs stable at discharge.  Follow up as scheduled.

## 2022-07-13 NOTE — Patient Instructions (Addendum)
[place] at [facility] Discharge Instructions   You were seen and examined today by Dr. Delton Coombes.  He reviewed the results of you lab work which are normal/stable. Your neutrophil count is slightly low at 1.3. We will add a white blood cell booster shot on the day you come get your pump off. Take Claritin every day starting today and for the next 5 days.   While you are using Zofran to help control your nausea, you should take stool softener (2 pills daily) to help with constipation, as Zofran can cause constipation.   We will add another anti-nausea medication called Emend to your treatment plan to help prevent nausea.  Return as scheduled.     Thank you for choosing [place] at [facility] to provide your oncology and hematology care.  To afford each patient quality time with our provider, please arrive at least 15 minutes before your scheduled appointment time.   If you have a lab appointment with the [facility] please come in thru the Main Entrance and check in at the main information desk.  You need to re-schedule your appointment should you arrive 10 or more minutes late.  We strive to give you quality time with our providers, and arriving late affects you and other patients whose appointments are after yours.  Also, if you no show three or more times for appointments you may be dismissed from the clinic at the providers discretion.     Again, thank you for choosing [facility].  Our hope is that these requests will decrease the amount of time that you wait before being seen by our physicians.       _____________________________________________________________  Should you have questions after your visit to [facility], please contact our office at [phone] and follow the prompts.  Our office hours are 8:00 a.m. and 4:30 p.m. Monday - Friday.  Please note that voicemails left after 4:00 p.m. may not  be returned until the following business day.  We are closed weekends and major holidays.  You do have access to a nurse 24-7, just call the main number to the clinic [phone] and do not press any options, hold on the line and a nurse will answer the phone.    For prescription refill requests, have your pharmacy contact our office and allow 72 hours.    Due to Covid, you will need to wear a mask upon entering the hospital. If you do not have a mask, a mask will be given to you at the Main Entrance upon arrival. For doctor visits, patients may have 1 support person age 51 or older with them. For treatment visits, patients can not have anyone with them due to social distancing guidelines and our immunocompromised population.

## 2022-07-13 NOTE — Patient Instructions (Signed)
[facility]  Discharge Instructions: Thank you for choosing [facility] to provide your oncology and hematology care.  If you have a lab appointment with the [facility], please come in thru the Main Entrance and check in at the main information desk.  Wear comfortable clothing and clothing appropriate for easy access to any Portacath or PICC line.   We strive to give you quality time with your provider. You may need to reschedule your appointment if you arrive late (15 or more minutes).  Arriving late affects you and other patients whose appointments are after yours.  Also, if you miss three or more appointments without notifying the office, you may be dismissed from the clinic at the provider's discretion.      For prescription refill requests, have your pharmacy contact our office and allow 72 hours for refills to be completed.    Today you received the following chemotherapy and/or immunotherapy agents Oxaliplatin/Leucovorin/Fluorouracil.  Oxaliplatin Injection What is this medication? OXALIPLATIN (ox AL i PLA tin) treats some types of cancer. It works by slowing down the growth of cancer cells. This medicine may be used for other purposes; ask your health care provider or pharmacist if you have questions. COMMON BRAND NAME(S): Eloxatin What should I tell my care team before I take this medication? They need to know if you have any of these conditions: Heart disease History of irregular heartbeat or rhythm Liver disease Low blood cell levels (white cells, red cells, and platelets) Lung or breathing disease, such as asthma Take medications that treat or prevent blood clots Tingling of the fingers, toes, or other nerve disorder An unusual or allergic reaction to oxaliplatin, other medications, foods, dyes, or preservatives If you or your partner are pregnant or trying to get pregnant Breast-feeding How should I use this medication? This medication  is injected into a vein. It is given by your care team in a hospital or clinic setting. Talk to your care team about the use of this medication in children. Special care may be needed. Overdosage: If you think you have taken too much of this medicine contact a poison control center or emergency room at once. NOTE: This medicine is only for you. Do not share this medicine with others. What if I miss a dose? Keep appointments for follow-up doses. It is important not to miss a dose. Call your care team if you are unable to keep an appointment. What may interact with this medication? Do not take this medication with any of the following: Cisapride Dronedarone Pimozide Thioridazine This medication may also interact with the following: Aspirin and aspirin-like medications Certain medications that treat or prevent blood clots, such as warfarin, apixaban, dabigatran, and rivaroxaban Cisplatin Cyclosporine Diuretics Medications for infection, such as acyclovir, adefovir, amphotericin B, bacitracin, cidofovir, foscarnet, ganciclovir, gentamicin, pentamidine, vancomycin NSAIDs, medications for pain and inflammation, such as ibuprofen or naproxen Other medications that cause heart rhythm changes Pamidronate Zoledronic acid This list may not describe all possible interactions. Give your health care provider a list of all the medicines, herbs, non-prescription drugs, or dietary supplements you use. Also tell them if you smoke, drink alcohol, or use illegal drugs. Some items may interact with your medicine. What should I watch for while using this medication? Your condition will be monitored carefully while you are receiving this medication. You may need blood work while taking this medication. This medication may make you feel generally unwell. This is not uncommon as chemotherapy can  can affect healthy cells as well as cancer cells. Report any side effects. Continue your course of treatment even though you  feel ill unless your care team tells you to stop. This medication may increase your risk of getting an infection. Call your care team for advice if you get a fever, chills, sore throat, or other symptoms of a cold or flu. Do not treat yourself. Try to avoid being around people who are sick. Avoid taking medications that contain aspirin, acetaminophen, ibuprofen, naproxen, or ketoprofen unless instructed by your care team. These medications may hide a fever. Be careful brushing or flossing your teeth or using a toothpick because you may get an infection or bleed more easily. If you have any dental work done, tell your dentist you are receiving this medication. This medication can make you more sensitive to cold. Do not drink cold drinks or use ice. Cover exposed skin before coming in contact with cold temperatures or cold objects. When out in cold weather wear warm clothing and cover your mouth and nose to warm the air that goes into your lungs. Tell your care team if you get sensitive to the cold. Talk to your care team if you or your partner are pregnant or think either of you might be pregnant. This medication can cause serious birth defects if taken during pregnancy and for 9 months after the last dose. A negative pregnancy test is required before starting this medication. A reliable form of contraception is recommended while taking this medication and for 9 months after the last dose. Talk to your care team about effective forms of contraception. Do not father a child while taking this medication and for 6 months after the last dose. Use a condom while having sex during this time period. Do not breastfeed while taking this medication and for 3 months after the last dose. This medication may cause infertility. Talk to your care team if you are concerned about your fertility. What side effects may I notice from receiving this medication? Side effects that you should report to your care team as soon as  possible: Allergic reactions--skin rash, itching, hives, swelling of the face, lips, tongue, or throat Bleeding--bloody or black, tar-like stools, vomiting blood or Kacy Hegna material that looks like coffee grounds, red or dark Sem Mccaughey urine, small red or purple spots on skin, unusual bruising or bleeding Dry cough, shortness of breath or trouble breathing Heart rhythm changes--fast or irregular heartbeat, dizziness, feeling faint or lightheaded, chest pain, trouble breathing Infection--fever, chills, cough, sore throat, wounds that don't heal, pain or trouble when passing urine, general feeling of discomfort or being unwell Liver injury--right upper belly pain, loss of appetite, nausea, light-colored stool, dark yellow or Keymoni Mccaster urine, yellowing skin or eyes, unusual weakness or fatigue Low red blood cell level--unusual weakness or fatigue, dizziness, headache, trouble breathing Muscle injury--unusual weakness or fatigue, muscle pain, dark yellow or Suliman Termini urine, decrease in amount of urine Pain, tingling, or numbness in the hands or feet Sudden and severe headache, confusion, change in vision, seizures, which may be signs of posterior reversible encephalopathy syndrome (PRES) Unusual bruising or bleeding Side effects that usually do not require medical attention (report to your care team if they continue or are bothersome): Diarrhea Nausea Pain, redness, or swelling with sores inside the mouth or throat Unusual weakness or fatigue Vomiting This list may not describe all possible side effects. Call your doctor for medical advice about side effects. You may report side effects to FDA at   1-800-FDA-1088. Where should I keep my medication? This medication is given in a hospital or clinic. It will not be stored at home. NOTE: This sheet is a summary. It may not cover all possible information. If you have questions about this medicine, talk to your doctor, pharmacist, or health care provider.  2023  Elsevier/Gold Standard (2007-10-01 00:00:00)    Leucovorin Injection What is this medication? LEUCOVORIN (loo koe VOR in) prevents side effects from certain medications, such as methotrexate. It works by increasing folate levels. This helps protect healthy cells in your body. It may also be used to treat anemia caused by low levels of folate. It can also be used with fluorouracil, a type of chemotherapy, to treat colorectal cancer. It works by increasing the effects of fluorouracil in the body. This medicine may be used for other purposes; ask your health care provider or pharmacist if you have questions. What should I tell my care team before I take this medication? They need to know if you have any of these conditions: Anemia from low levels of vitamin B12 in the blood An unusual or allergic reaction to leucovorin, folic acid, other medications, foods, dyes, or preservatives Pregnant or trying to get pregnant Breastfeeding How should I use this medication? This medication is injected into a vein or a muscle. It is given by your care team in a hospital or clinic setting. Talk to your care team about the use of this medication in children. Special care may be needed. Overdosage: If you think you have taken too much of this medicine contact a poison control center or emergency room at once. NOTE: This medicine is only for you. Do not share this medicine with others. What if I miss a dose? Keep appointments for follow-up doses. It is important not to miss your dose. Call your care team if you are unable to keep an appointment. What may interact with this medication? Capecitabine Fluorouracil Phenobarbital Phenytoin Primidone Trimethoprim;sulfamethoxazole This list may not describe all possible interactions. Give your health care provider a list of all the medicines, herbs, non-prescription drugs, or dietary supplements you use. Also tell them if you smoke, drink alcohol, or use illegal  drugs. Some items may interact with your medicine. What should I watch for while using this medication? Your condition will be monitored carefully while you are receiving this medication. This medication may increase the side effects of 5-fluorouracil. Tell your care team if you have diarrhea or mouth sores that do not get better or that get worse. What side effects may I notice from receiving this medication? Side effects that you should report to your care team as soon as possible: Allergic reactions--skin rash, itching, hives, swelling of the face, lips, tongue, or throat This list may not describe all possible side effects. Call your doctor for medical advice about side effects. You may report side effects to FDA at 1-800-FDA-1088. Where should I keep my medication? This medication is given in a hospital or clinic. It will not be stored at home. NOTE: This sheet is a summary. It may not cover all possible information. If you have questions about this medicine, talk to your doctor, pharmacist, or health care provider.  2023 Elsevier/Gold Standard (2022-01-13 00:00:00)    Fluorouracil Injection What is this medication? FLUOROURACIL (flure oh YOOR a sil) treats some types of cancer. It works by slowing down the growth of cancer cells. This medicine may be used for other purposes; ask your health care provider or pharmacist if   you have questions. COMMON BRAND NAME(S): Adrucil What should I tell my care team before I take this medication? They need to know if you have any of these conditions: Blood disorders Dihydropyrimidine dehydrogenase (DPD) deficiency Infection, such as chickenpox, cold sores, herpes Kidney disease Liver disease Poor nutrition Recent or ongoing radiation therapy An unusual or allergic reaction to fluorouracil, other medications, foods, dyes, or preservatives If you or your partner are pregnant or trying to get pregnant Breast-feeding How should I use this  medication? This medication is injected into a vein. It is administered by your care team in a hospital or clinic setting. Talk to your care team about the use of this medication in children. Special care may be needed. Overdosage: If you think you have taken too much of this medicine contact a poison control center or emergency room at once. NOTE: This medicine is only for you. Do not share this medicine with others. What if I miss a dose? Keep appointments for follow-up doses. It is important not to miss your dose. Call your care team if you are unable to keep an appointment. What may interact with this medication? Do not take this medication with any of the following: Live virus vaccines This medication may also interact with the following: Medications that treat or prevent blood clots, such as warfarin, enoxaparin, dalteparin This list may not describe all possible interactions. Give your health care provider a list of all the medicines, herbs, non-prescription drugs, or dietary supplements you use. Also tell them if you smoke, drink alcohol, or use illegal drugs. Some items may interact with your medicine. What should I watch for while using this medication? Your condition will be monitored carefully while you are receiving this medication. This medication may make you feel generally unwell. This is not uncommon as chemotherapy can affect healthy cells as well as cancer cells. Report any side effects. Continue your course of treatment even though you feel ill unless your care team tells you to stop. In some cases, you may be given additional medications to help with side effects. Follow all directions for their use. This medication may increase your risk of getting an infection. Call your care team for advice if you get a fever, chills, sore throat, or other symptoms of a cold or flu. Do not treat yourself. Try to avoid being around people who are sick. This medication may increase your risk  to bruise or bleed. Call your care team if you notice any unusual bleeding. Be careful brushing or flossing your teeth or using a toothpick because you may get an infection or bleed more easily. If you have any dental work done, tell your dentist you are receiving this medication. Avoid taking medications that contain aspirin, acetaminophen, ibuprofen, naproxen, or ketoprofen unless instructed by your care team. These medications may hide a fever. Do not treat diarrhea with over the counter products. Contact your care team if you have diarrhea that lasts more than 2 days or if it is severe and watery. This medication can make you more sensitive to the sun. Keep out of the sun. If you cannot avoid being in the sun, wear protective clothing and sunscreen. Do not use sun lamps, tanning beds, or tanning booths. Talk to your care team if you or your partner wish to become pregnant or think you might be pregnant. This medication can cause serious birth defects if taken during pregnancy and for 3 months after the last dose. A reliable form of contraception   is recommended while taking this medication and for 3 months after the last dose. Talk to your care team about effective forms of contraception. Do not father a child while taking this medication and for 3 months after the last dose. Use a condom while having sex during this time period. Do not breastfeed while taking this medication. This medication may cause infertility. Talk to your care team if you are concerned about your fertility. What side effects may I notice from receiving this medication? Side effects that you should report to your care team as soon as possible: Allergic reactions--skin rash, itching, hives, swelling of the face, lips, tongue, or throat Heart attack--pain or tightness in the chest, shoulders, arms, or jaw, nausea, shortness of breath, cold or clammy skin, feeling faint or lightheaded Heart failure--shortness of breath, swelling of  the ankles, feet, or hands, sudden weight gain, unusual weakness or fatigue Heart rhythm changes--fast or irregular heartbeat, dizziness, feeling faint or lightheaded, chest pain, trouble breathing High ammonia level--unusual weakness or fatigue, confusion, loss of appetite, nausea, vomiting, seizures Infection--fever, chills, cough, sore throat, wounds that don't heal, pain or trouble when passing urine, general feeling of discomfort or being unwell Low red blood cell level--unusual weakness or fatigue, dizziness, headache, trouble breathing Pain, tingling, or numbness in the hands or feet, muscle weakness, change in vision, confusion or trouble speaking, loss of balance or coordination, trouble walking, seizures Redness, swelling, and blistering of the skin over hands and feet Severe or prolonged diarrhea Unusual bruising or bleeding Side effects that usually do not require medical attention (report to your care team if they continue or are bothersome): Dry skin Headache Increased tears Nausea Pain, redness, or swelling with sores inside the mouth or throat Sensitivity to light Vomiting This list may not describe all possible side effects. Call your doctor for medical advice about side effects. You may report side effects to FDA at 1-800-FDA-1088. Where should I keep my medication? This medication is given in a hospital or clinic. It will not be stored at home. NOTE: This sheet is a summary. It may not cover all possible information. If you have questions about this medicine, talk to your doctor, pharmacist, or health care provider.  2023 Elsevier/Gold Standard (2021-12-09 00:00:00)        To help prevent nausea and vomiting after your treatment, we encourage you to take your nausea medication as directed.  BELOW ARE SYMPTOMS THAT SHOULD BE REPORTED IMMEDIATELY: *FEVER GREATER THAN 100.4 F (38 C) OR HIGHER *CHILLS OR SWEATING *NAUSEA AND VOMITING THAT IS NOT CONTROLLED WITH YOUR  NAUSEA MEDICATION *UNUSUAL SHORTNESS OF BREATH *UNUSUAL BRUISING OR BLEEDING *URINARY PROBLEMS (pain or burning when urinating, or frequent urination) *BOWEL PROBLEMS (unusual diarrhea, constipation, pain near the anus) TENDERNESS IN MOUTH AND THROAT WITH OR WITHOUT PRESENCE OF ULCERS (sore throat, sores in mouth, or a toothache) UNUSUAL RASH, SWELLING OR PAIN  UNUSUAL VAGINAL DISCHARGE OR ITCHING   Items with * indicate a potential emergency and should be followed up as soon as possible or go to the Emergency Department if any problems should occur.  Please show the CHEMOTHERAPY ALERT CARD or IMMUNOTHERAPY ALERT CARD at check-in to the Emergency Department and triage nurse.  Should you have questions after your visit or need to cancel or reschedule your appointment, please contact [facility] [phone]  and follow the prompts.  Office hours are 8:00 a.m. to 4:30 p.m. Monday - Friday. Please note that voicemails left after 4:00 p.m.   not be returned until the following business day.  We are closed weekends and major holidays. You have access to a nurse at all times for urgent questions. Please call the main number to the clinic [phone] and follow the prompts.  For any non-urgent questions, you may also contact your provider using MyChart. We now offer e-Visits for anyone 85 and older to request care online for non-urgent symptoms. For details visit [email or web address].   Also download the MyChart app! Go to the app store, search "MyChart", open the app, select [facility], and log in with your MyChart username and password.  Masks are optional in the cancer centers. If you would like for your care team to wear a mask while they are taking care of you, please let them know. You may have one support person who is at least 51 years old accompany you for your appointments.

## 2022-07-15 ENCOUNTER — Inpatient Hospital Stay: Payer: 59

## 2022-07-15 VITALS — BP 112/90 | HR 70 | Temp 98.3°F | Resp 16

## 2022-07-15 DIAGNOSIS — Z5111 Encounter for antineoplastic chemotherapy: Secondary | ICD-10-CM | POA: Diagnosis not present

## 2022-07-15 DIAGNOSIS — Z79899 Other long term (current) drug therapy: Secondary | ICD-10-CM | POA: Diagnosis not present

## 2022-07-15 DIAGNOSIS — C20 Malignant neoplasm of rectum: Secondary | ICD-10-CM

## 2022-07-15 DIAGNOSIS — Z5189 Encounter for other specified aftercare: Secondary | ICD-10-CM | POA: Diagnosis not present

## 2022-07-15 DIAGNOSIS — Z95828 Presence of other vascular implants and grafts: Secondary | ICD-10-CM

## 2022-07-15 MED ORDER — SODIUM CHLORIDE 0.9% FLUSH
10.0000 mL | INTRAVENOUS | Status: DC | PRN
  Administered 2022-07-15: 10 mL

## 2022-07-15 MED ORDER — HEPARIN SOD (PORK) LOCK FLUSH 100 UNIT/ML IV SOLN
500.0000 [IU] | Freq: Once | INTRAVENOUS | Status: AC | PRN
  Administered 2022-07-15: 500 [IU]

## 2022-07-15 MED ORDER — PEGFILGRASTIM-JMDB 6 MG/0.6ML ~~LOC~~ SOSY
6.0000 mg | PREFILLED_SYRINGE | Freq: Once | SUBCUTANEOUS | Status: AC
  Administered 2022-07-15: 6 mg via SUBCUTANEOUS
  Filled 2022-07-15: qty 0.6

## 2022-07-15 NOTE — Progress Notes (Signed)
Patient presents today for 5FU pump stop and disconnection after 46 hour continous infusion.   5FU pump deaccessed.  Patients port flushed without difficulty.  Good blood return noted with no bruising or swelling noted at site.  Needle removed intact.  Band aid applied.  Fulphila administration without incident; injection site WNL; see MAR for injection details.  Patient tolerated procedure well and without incident.  No questions or complaints noted at this time. VSS with discharge and left in satisfactory condition via wheelchair with no s/s of distress noted.

## 2022-07-15 NOTE — Patient Instructions (Signed)
[facility]  Discharge Instructions: Thank you for choosing [facility] to provide your oncology and hematology care.  If you have a lab appointment with the [facility], please come in thru the Main Entrance and check in at the main information desk.  Wear comfortable clothing and clothing appropriate for easy access to any Portacath or PICC line.   We strive to give you quality time with your provider. You may need to reschedule your appointment if you arrive late (15 or more minutes).  Arriving late affects you and other patients whose appointments are after yours.  Also, if you miss three or more appointments without notifying the office, you may be dismissed from the clinic at the provider's discretion.      For prescription refill requests, have your pharmacy contact our office and allow 72 hours for refills to be completed.    Today you received the following chemotherapy and/or immunotherapy agents Pump DC/ Fulphila      To help prevent nausea and vomiting after your treatment, we encourage you to take your nausea medication as directed.  BELOW ARE SYMPTOMS THAT SHOULD BE REPORTED IMMEDIATELY: *FEVER GREATER THAN 100.4 F (38 C) OR HIGHER *CHILLS OR SWEATING *NAUSEA AND VOMITING THAT IS NOT CONTROLLED WITH YOUR NAUSEA MEDICATION *UNUSUAL SHORTNESS OF BREATH *UNUSUAL BRUISING OR BLEEDING *URINARY PROBLEMS (pain or burning when urinating, or frequent urination) *BOWEL PROBLEMS (unusual diarrhea, constipation, pain near the anus) TENDERNESS IN MOUTH AND THROAT WITH OR WITHOUT PRESENCE OF ULCERS (sore throat, sores in mouth, or a toothache) UNUSUAL RASH, SWELLING OR PAIN  UNUSUAL VAGINAL DISCHARGE OR ITCHING   Items with * indicate a potential emergency and should be followed up as soon as possible or go to the Emergency Department if any problems should occur.  Please show the CHEMOTHERAPY ALERT CARD or IMMUNOTHERAPY ALERT CARD at check-in to the  Emergency Department and triage nurse.  Should you have questions after your visit or need to cancel or reschedule your appointment, please contact [facility] [phone]  and follow the prompts.  Office hours are 8:00 a.m. to 4:30 p.m. Monday - Friday. Please note that voicemails left after 4:00 p.m. may not be returned until the following business day.  We are closed weekends and major holidays. You have access to a nurse at all times for urgent questions. Please call the main number to the clinic [phone] and follow the prompts.  For any non-urgent questions, you may also contact your provider using MyChart. We now offer e-Visits for anyone 38 and older to request care online for non-urgent symptoms. For details visit [email or web address].   Also download the MyChart app! Go to the app store, search "MyChart", open the app, select [facility], and log in with your MyChart username and password.  Masks are optional in the cancer centers. If you would like for your care team to wear a mask while they are taking care of you, please let them know. You may have one support person who is at least 51 years old accompany you for your appointments.

## 2022-07-20 ENCOUNTER — Ambulatory Visit: Payer: No Typology Code available for payment source

## 2022-07-27 ENCOUNTER — Inpatient Hospital Stay: Payer: No Typology Code available for payment source

## 2022-07-27 ENCOUNTER — Inpatient Hospital Stay: Payer: No Typology Code available for payment source | Admitting: Dietician

## 2022-07-27 ENCOUNTER — Inpatient Hospital Stay: Payer: No Typology Code available for payment source | Attending: Hematology

## 2022-07-27 ENCOUNTER — Inpatient Hospital Stay: Payer: No Typology Code available for payment source | Admitting: Hematology

## 2022-07-27 VITALS — BP 136/92 | HR 75 | Temp 97.6°F | Resp 18

## 2022-07-27 DIAGNOSIS — Z95828 Presence of other vascular implants and grafts: Secondary | ICD-10-CM

## 2022-07-27 DIAGNOSIS — C20 Malignant neoplasm of rectum: Secondary | ICD-10-CM | POA: Insufficient documentation

## 2022-07-27 DIAGNOSIS — Z5189 Encounter for other specified aftercare: Secondary | ICD-10-CM | POA: Diagnosis not present

## 2022-07-27 DIAGNOSIS — Z5111 Encounter for antineoplastic chemotherapy: Secondary | ICD-10-CM | POA: Insufficient documentation

## 2022-07-27 LAB — CBC WITH DIFFERENTIAL/PLATELET
Abs Immature Granulocytes: 0.3 10*3/uL — ABNORMAL HIGH (ref 0.00–0.07)
Band Neutrophils: 4 %
Basophils Absolute: 0 10*3/uL (ref 0.0–0.1)
Basophils Relative: 0 %
Eosinophils Absolute: 0.1 10*3/uL (ref 0.0–0.5)
Eosinophils Relative: 2 %
HCT: 40.7 % (ref 39.0–52.0)
Hemoglobin: 14.4 g/dL (ref 13.0–17.0)
Lymphocytes Relative: 33 %
Lymphs Abs: 1.8 10*3/uL (ref 0.7–4.0)
MCH: 31.8 pg (ref 26.0–34.0)
MCHC: 35.4 g/dL (ref 30.0–36.0)
MCV: 89.8 fL (ref 80.0–100.0)
Metamyelocytes Relative: 5 %
Monocytes Absolute: 0.7 10*3/uL (ref 0.1–1.0)
Monocytes Relative: 12 %
Neutro Abs: 2.6 10*3/uL (ref 1.7–7.7)
Neutrophils Relative %: 44 %
Platelets: 94 10*3/uL — ABNORMAL LOW (ref 150–400)
RBC: 4.53 MIL/uL (ref 4.22–5.81)
RDW: 13.6 % (ref 11.5–15.5)
WBC: 5.5 10*3/uL (ref 4.0–10.5)
nRBC: 0 % (ref 0.0–0.2)

## 2022-07-27 LAB — COMPREHENSIVE METABOLIC PANEL
ALT: 36 U/L (ref 0–44)
AST: 35 U/L (ref 15–41)
Albumin: 3.5 g/dL (ref 3.5–5.0)
Alkaline Phosphatase: 133 U/L — ABNORMAL HIGH (ref 38–126)
Anion gap: 8 (ref 5–15)
BUN: 16 mg/dL (ref 6–20)
CO2: 23 mmol/L (ref 22–32)
Calcium: 8.3 mg/dL — ABNORMAL LOW (ref 8.9–10.3)
Chloride: 108 mmol/L (ref 98–111)
Creatinine, Ser: 0.98 mg/dL (ref 0.61–1.24)
GFR, Estimated: >60 mL/min (ref >60)
Glucose, Bld: 121 mg/dL — ABNORMAL HIGH (ref 70–99)
Potassium: 3.9 mmol/L (ref 3.5–5.1)
Sodium: 139 mmol/L (ref 135–145)
Total Bilirubin: 0.5 mg/dL (ref 0.3–1.2)
Total Protein: 6.5 g/dL (ref 6.5–8.1)

## 2022-07-27 LAB — MAGNESIUM: Magnesium: 2.2 mg/dL (ref 1.7–2.4)

## 2022-07-27 MED ORDER — DEXTROSE 5 % IV SOLN: Freq: Once | INTRAVENOUS | Status: AC

## 2022-07-27 MED ORDER — SODIUM CHLORIDE 0.9% FLUSH
10.0000 mL | Freq: Once | INTRAVENOUS | Status: AC
  Administered 2022-07-27: 10 mL via INTRAVENOUS

## 2022-07-27 MED ORDER — OXALIPLATIN CHEMO INJECTION 100 MG/20ML
85.0000 mg/m2 | Freq: Once | INTRAVENOUS | Status: AC
  Administered 2022-07-27: 170 mg via INTRAVENOUS
  Filled 2022-07-27: qty 34

## 2022-07-27 MED ORDER — FLUOROURACIL CHEMO INJECTION 2.5 GM/50ML
400.0000 mg/m2 | Freq: Once | INTRAVENOUS | Status: AC
  Administered 2022-07-27: 800 mg via INTRAVENOUS
  Filled 2022-07-27: qty 16

## 2022-07-27 MED ORDER — SODIUM CHLORIDE 0.9% FLUSH
10.0000 mL | INTRAVENOUS | Status: DC | PRN
  Administered 2022-07-27: 10 mL

## 2022-07-27 MED ORDER — SODIUM CHLORIDE 0.9 % IV SOLN
150.0000 mg | Freq: Once | INTRAVENOUS | Status: AC
  Administered 2022-07-27: 150 mg via INTRAVENOUS
  Filled 2022-07-27: qty 150

## 2022-07-27 MED ORDER — LEUCOVORIN CALCIUM INJECTION 350 MG
400.0000 mg/m2 | Freq: Once | INTRAVENOUS | Status: AC
  Administered 2022-07-27: 800 mg via INTRAVENOUS
  Filled 2022-07-27: qty 40

## 2022-07-27 MED ORDER — PALONOSETRON HCL INJECTION 0.25 MG/5ML
0.2500 mg | Freq: Once | INTRAVENOUS | Status: AC
  Administered 2022-07-27: 0.25 mg via INTRAVENOUS
  Filled 2022-07-27: qty 5

## 2022-07-27 MED ORDER — SODIUM CHLORIDE 0.9 % IV SOLN
2500.0000 mg/m2 | INTRAVENOUS | Status: DC
  Administered 2022-07-27: 5000 mg via INTRAVENOUS
  Filled 2022-07-27: qty 100

## 2022-07-27 MED ORDER — SODIUM CHLORIDE 0.9 % IV SOLN
10.0000 mg | Freq: Once | INTRAVENOUS | Status: AC
  Administered 2022-07-27: 10 mg via INTRAVENOUS
  Filled 2022-07-27: qty 10

## 2022-07-27 NOTE — Progress Notes (Signed)
Patient has been examined by Dr. Katragadda, and vital signs and labs have been reviewed. ANC, Creatinine, LFTs, hemoglobin, and platelets are within treatment parameters per M.D. - pt may proceed with treatment.  Primary RN and pharmacy notified.  

## 2022-07-27 NOTE — Progress Notes (Signed)
Nutrition Follow-up:  Patient with stage IIIB rectal cancer. He is receiving neoadjuvant chemotherapy with FOLOFOX q14d x 4 months (first 11/6) followed by concurrent radiation with xeloda. Plans for adjuvant surgery.   Met with patient and wife during infusion. He reports nausea lasting ~3 days after therapy. Compazine is helping with this, but makes him sleepy. Patient reports poor appetite that  improves on week 2. Recalls chick fila nuggets on Thanksgiving. Says [place] tasted greasy and the sage in stuffing was too intense. He has been eating mostly bland foods (mac/cheese, potatoes, pintos, chicken salad, cheese/crackers, cabbage soup). Patient endorses one week of cold sensitivity with touch as well as intake. States cold things feel furry in his mouth. Patient continues to have altered taste of water. His wife has been making kool-aid with extra sugar to mask bad taste. He is also drinking hot chocolate, sundrop, and room temperature orange juice. Constipation has resolved. He is having daily bowel movements. Patient has not needed to take stool softener.    Medications: reviewed   Labs: glucose 121, Ca 8.3  Anthropometrics: Wt 177 lb 14.6 oz today slightly decreased   11/20 - 179 lb  11/6 - 179 lb 3.2 oz   NUTRITION DIAGNOSIS: Food and nutrition related knowledge deficit improving   INTERVENTION:  Continue low residue diet Encouraged frequent snacking of high calorie high protein foods when appetite is poor - high calorie recipes provided Strategies for adding calories and protein to foods discussed (adding Ensure/CIB to hot chocolate, making with whole milk vs water) - samples of Ensure Complete, CIB, Unjury protein powder given for pt to try Suggested trying Crystal Light vs kool-aid  Take nausea medication as prescribed per MD    MONITORING, EVALUATION, GOAL: weight trends, intake   NEXT VISIT: Monday December 18 during infusion

## 2022-07-27 NOTE — Progress Notes (Signed)
Patient tolerated chemotherapy with no complaints voiced. Side effects with management reviewed understanding verbalized. Port site clean and dry with no bruising or swelling noted at site. Good blood return noted before and after administration of chemotherapy. Chemo pump connected. Patient left in satisfactory condition with VSS and no s/s of distress noted.

## 2022-07-27 NOTE — Patient Instructions (Addendum)
[place] at [facility] Discharge Instructions   You were seen and examined today by Dr. Delton Coombes.  He reviewed the results of your lab work which are normal/stable. Your platelets are slightly low at 94,000. We will continue to monitor.   We will proceed with your treatment today.   Return as scheduled.      Thank you for choosing [place] at [facility] to provide your oncology and hematology care.  To afford each patient quality time with our provider, please arrive at least 15 minutes before your scheduled appointment time.   If you have a lab appointment with the [facility] please come in thru the Main Entrance and check in at the main information desk.  You need to re-schedule your appointment should you arrive 10 or more minutes late.  We strive to give you quality time with our providers, and arriving late affects you and other patients whose appointments are after yours.  Also, if you no show three or more times for appointments you may be dismissed from the clinic at the providers discretion.     Again, thank you for choosing [facility].  Our hope is that these requests will decrease the amount of time that you wait before being seen by our physicians.       _____________________________________________________________  Should you have questions after your visit to [facility], please contact our office at [phone] and follow the prompts.  Our office hours are 8:00 a.m. and 4:30 p.m. Monday - Friday.  Please note that voicemails left after 4:00 p.m. may not be returned until the following business day.  We are closed weekends and major holidays.  You do have access to a nurse 24-7, just call the main number to the clinic [phone] and do not press any options, hold on the line and a nurse will answer the phone.    For prescription refill requests, have your pharmacy contact our office and  allow 72 hours.    Due to Covid, you will need to wear a mask upon entering the hospital. If you do not have a mask, a mask will be given to you at the Main Entrance upon arrival. For doctor visits, patients may have 1 support person age 33 or older with them. For treatment visits, patients can not have anyone with them due to social distancing guidelines and our immunocompromised population.

## 2022-07-27 NOTE — Progress Notes (Signed)
[address] [place], [place] [address]   CLINIC:  Medical Oncology/Hematology  PCP:  Practice, [facility] [place] [address] [phone]   REASON FOR VISIT:  Follow-up for stage III rectal cancer  PRIOR THERAPY: None  NGS Results: MSI-stable, MMR-preserved  CURRENT THERAPY: FOLFOX  BRIEF ONCOLOGIC HISTORY:  Oncology History  Rectal carcinoma ([place])  06/17/2022 Initial Diagnosis   Rectal carcinoma ([place])   06/29/2022 -  Chemotherapy   Patient is on Treatment Plan : COLORECTAL FOLFOX q14d x 4 months       CANCER STAGING:  Cancer Staging  Rectal carcinoma ([place]) Staging form: Colon and Rectum, AJCC 8th Edition - Clinical stage from 06/18/2022: Stage IIIB (cT3, cN1, cM0) - Unsigned    INTERVAL HISTORY:  [name] 51 y.o. male seen for follow-up of rectal cancer and toxicity assessment prior to next cycle of chemotherapy.  He had nausea and dry heaves day 1 and day 2 of last cycle.  He reported tingling in the fingertips when exposed to cold weather.  Denies any diarrhea.   REVIEW OF SYSTEMS:  Review of Systems  Respiratory:  Positive for shortness of breath.   Neurological:  Positive for numbness (On exposure to cold).  All other systems reviewed and are negative.    PAST MEDICAL/SURGICAL HISTORY:  Past Medical History:  Diagnosis Date   Rectal bleeding    Rectal cancer ([place]) 06/08/2022   Past Surgical History:  Procedure Laterality Date   COLONOSCOPY  06/08/2022   EYE SURGERY     FRACTURE SURGERY     Right Femur- Cables in bone remain in place, rod removed in 2000   IR IMAGING GUIDED PORT INSERTION  06/22/2022   ORTHOPEDIC SURGERY       SOCIAL HISTORY:  Social History   Socioeconomic History   Marital status: Married    Spouse name: Not on file   Number of children: Not on file   Years of education: Not on file   Highest education level: Not on file  Occupational History   Not on file  Tobacco  Use   Smoking status: Former    Types: Cigarettes    Quit date: 2003    Years since quitting: 20.9   Smokeless tobacco: Current    Types: Snuff   Tobacco comments:    Some Dipping  Vaping Use   Vaping Use: Never used  Substance and Sexual Activity   Alcohol use: Yes   Drug use: Never   Sexual activity: Not on file  Other Topics Concern   Not on file  Social History Narrative   Not on file   Social Determinants of Health   Financial Resource Strain: Not on file  Food Insecurity: Not on file  Transportation Needs: Not on file  Physical Activity: Not on file  Stress: Not on file  Social Connections: Not on file  Intimate Partner Violence: Not on file    FAMILY HISTORY:  Family History  Problem Relation Age of Onset   Cancer Mother        GYN malignancy details unknown   Colon cancer Father    Heart attack Father    Colon cancer Paternal Aunt    Colon cancer Paternal Grandmother    Colon cancer Paternal Grandfather     CURRENT MEDICATIONS:  Outpatient Encounter Medications as of 07/27/2022  Medication Sig   Brinzolamide-Brimonidine 1-0.2 % SUSP    dextrose 5 % SOLN 1,000 mL with  fluorouracil 5 GM/100ML SOLN Inject into the vein over 48 hr. Every 14 days   FLUOROURACIL IV Inject into the vein every 14 (fourteen) days.   ibuprofen (ADVIL) 200 MG tablet Take 200 mg by mouth every 6 (six) hours as needed for moderate pain.   LEUCOVORIN CALCIUM IV Inject into the vein every 14 (fourteen) days.   lidocaine-prilocaine (EMLA) cream Apply a small amount to port a cath site and cover with plastic wrap one hour prior to infusion appointments   Multiple Vitamin (MULTIVITAMIN) tablet Take 1 tablet by mouth daily.   ondansetron (ZOFRAN) 8 MG tablet TAKE 1 TABLET BY MOUTH EVERY 8 HOURS AS NEEDED FOR NAUSEA OR VOMITING.   OXALIPLATIN IV Inject into the vein every 14 (fourteen) days.   prochlorperazine (COMPAZINE) 10 MG tablet Take 1 tablet (10 mg total) by mouth every 6 (six) hours  as needed for nausea or vomiting.   promethazine (PHENERGAN) 25 MG suppository Place 1 suppository (25 mg total) rectally every 6 (six) hours as needed for nausea or vomiting.   Salicylic Acid, Acne, (SALICYLIC ACID EX) Apply 1 Application topically daily as needed (acne).   sodium chloride (OCEAN) 0.65 % SOLN nasal spray Place 1 spray into both nostrils as needed for congestion.   No facility-administered encounter medications on file as of 07/27/2022.    ALLERGIES:  Allergies  Allergen Reactions   Hydrocodone Itching   Latex     Eye irritation      PHYSICAL EXAM:  ECOG Performance status: 0  There were no vitals filed for this visit. There were no vitals filed for this visit. Physical Exam Vitals reviewed.  Constitutional:      Appearance: Normal appearance.  Cardiovascular:     Rate and Rhythm: Normal rate and regular rhythm.     Pulses: Normal pulses.     Heart sounds: Normal heart sounds.  Pulmonary:     Effort: Pulmonary effort is normal.     Breath sounds: Normal breath sounds.  Neurological:     Mental Status: He is alert.  Psychiatric:        Mood and Affect: Mood normal.        Behavior: Behavior normal.      LABORATORY DATA:  I have reviewed the labs as listed.  CBC    Component Value Date/Time   WBC 2.9 (L) 07/13/2022 0826   RBC 4.58 07/13/2022 0826   HGB 14.7 07/13/2022 0826   HCT 40.1 07/13/2022 0826   PLT 112 (L) 07/13/2022 0826   MCV 87.6 07/13/2022 0826   MCH 32.1 07/13/2022 0826   MCHC 36.7 (H) 07/13/2022 0826   RDW 12.3 07/13/2022 0826   LYMPHSABS 1.2 07/13/2022 0826   MONOABS 0.3 07/13/2022 0826   EOSABS 0.2 07/13/2022 0826   BASOSABS 0.1 07/13/2022 0826      Latest Ref Rng & Units 07/13/2022    8:26 AM 06/29/2022    8:32 AM 09/11/2021   11:28 AM  CMP  Glucose 70 - 99 mg/dL 148  114  95   BUN 6 - 20 mg/dL _0 Creatinine 0.61 - 1.24 mg/dL 1.04  1.03  1.16   Sodium 135 - 145 mmol/L 138  137  136   Potassium 3.5 - 5.1  mmol/L 3.5  3.7  3.9   Chloride 98 - 111 mmol/L 108  106  103   CO2 22 - 32 mmol/L _1 Calcium 8.9 - 10.3  mg/dL 8.3  8.5  8.8   Total Protein 6.5 - 8.1 g/dL 6.5  6.9    Total Bilirubin 0.3 - 1.2 mg/dL 1.0  1.1    Alkaline Phos 38 - 126 U/L 105  100    AST 15 - 41 U/L 25  20    ALT 0 - 44 U/L 22  19      DIAGNOSTIC IMAGING:  I have independently reviewed the scans and discussed with the patient.  ASSESSMENT:  1.  Stage IIIB (T3cN1) rectal adenocarcinoma, MSI stable: - Presentation with rectal bleeding - Colonoscopy (06/08/2022): Fungating partially obstructing large mass found in the rectum 11 cm from anal verge.  Partially circumferential involving one half of the lumen.  Mass measured 3 cm in length.  Diameter 2 cm.  Oozing present.  1 semipedunculated polyp (40 cm from anus) and 3 sessile polyps in the cecum removed. - Pathology (06/08/2022): Rectal mass 11 cm from anal verge-adenocarcinoma.  MMR preserved.  MSI-stable. - CEA (06/08/2022): 2.9 - MRI pelvis (06/10/2022): T3CN1, tumor extends into sigmoid from the high rectum, extension through muscularis propria, approximately 6 mm beyond the rectal wall.  Tumor begins in the highly portion of the rectum and extends into the sigmoid colon.  Extramural vascular invasion/tumor thrombus along the right lateral margin.  Shortest distance of tumor from mesorectal fascia: 15 mm.  Single high mesorectal or superior rectal lymph node measuring 9 mm.  Distance from tumor to internal anal sphincter is approximately 7-11 cm. - CT CAP (06/10/2022): Circumferential wall thickening of the upper/mid rectum, mildly enlarged high perirectal lymph nodes measuring up to 6 mm in short axis.  No evidence of distant metastatic disease in the chest, abdomen or pelvis. - Neoadjuvant FOLFOX cycle 1 on 06/29/2022   2.  Social/family history: - He lives at home with his wife Troy Dillon.  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  Denies any chemical exposure.   Does not smoke cigarettes but dips tobacco. - Father had colon cancer at age 34. - Paternal grandmother died of colon cancer. - Paternal aunt had colon cancer. - Mother had "male cancer" and underwent hysterectomy.    PLAN:  1.  Stage IIIb (T3CN1) rectal adenocarcinoma, MSI-stable: - He had some nausea and dry heaves on day 1 and day 2 of last cycle. - Tingling in the fingertips when exposed to cold weather. - Reviewed labs today which showed platelet count low at 94 and white count normal at 5.5.  Alk phos is elevated at 133.  Rest of LFTs are normal. - Proceed with cycle 3 without any dose modifications.  RTC 2 weeks for follow-up.  2.  Nausea/vomiting: - We have added Emend at last treatment.  He had nausea and dry heaves on day 1 and day 2. - Continue Phenergan suppository every 6 hours as needed.      Orders placed this encounter:  No orders of the defined types were placed in this encounter.     Derek Jack, MD [place] [phone]

## 2022-07-27 NOTE — Patient Instructions (Signed)
[facility]  Discharge Instructions: Thank you for choosing [facility] to provide your oncology and hematology care.  If you have a lab appointment with the [facility], please come in thru the Main Entrance and check in at the main information desk.  Wear comfortable clothing and clothing appropriate for easy access to any Portacath or PICC line.   We strive to give you quality time with your provider. You may need to reschedule your appointment if you arrive late (15 or more minutes).  Arriving late affects you and other patients whose appointments are after yours.  Also, if you miss three or more appointments without notifying the office, you may be dismissed from the clinic at the provider's discretion.      For prescription refill requests, have your pharmacy contact our office and allow 72 hours for refills to be completed.    Today you received the following chemotherapy and/or immunotherapy agents FOLFOX, return as scheduled.  To help prevent nausea and vomiting after your treatment, we encourage you to take your nausea medication as directed.  BELOW ARE SYMPTOMS THAT SHOULD BE REPORTED IMMEDIATELY: *FEVER GREATER THAN 100.4 F (38 C) OR HIGHER *CHILLS OR SWEATING *NAUSEA AND VOMITING THAT IS NOT CONTROLLED WITH YOUR NAUSEA MEDICATION *UNUSUAL SHORTNESS OF BREATH *UNUSUAL BRUISING OR BLEEDING *URINARY PROBLEMS (pain or burning when urinating, or frequent urination) *BOWEL PROBLEMS (unusual diarrhea, constipation, pain near the anus) TENDERNESS IN MOUTH AND THROAT WITH OR WITHOUT PRESENCE OF ULCERS (sore throat, sores in mouth, or a toothache) UNUSUAL RASH, SWELLING OR PAIN  UNUSUAL VAGINAL DISCHARGE OR ITCHING   Items with * indicate a potential emergency and should be followed up as soon as possible or go to the Emergency Department if any problems should occur.  Please show the CHEMOTHERAPY ALERT CARD or IMMUNOTHERAPY ALERT CARD at check-in  to the Emergency Department and triage nurse.  Should you have questions after your visit or need to cancel or reschedule your appointment, please contact [facility] [phone]  and follow the prompts.  Office hours are 8:00 a.m. to 4:30 p.m. Monday - Friday. Please note that voicemails left after 4:00 p.m. may not be returned until the following business day.  We are closed weekends and major holidays. You have access to a nurse at all times for urgent questions. Please call the main number to the clinic [phone] and follow the prompts.  For any non-urgent questions, you may also contact your provider using MyChart. We now offer e-Visits for anyone 66 and older to request care online for non-urgent symptoms. For details visit [email or web address].   Also download the MyChart app! Go to the app store, search "MyChart", open the app, select [name], and log in with your MyChart username and password.  Masks are optional in the cancer centers. If you would like for your care team to wear a mask while they are taking care of you, please let them know. You may have one support person who is at least 51 years old accompany you for your appointments.

## 2022-07-29 ENCOUNTER — Inpatient Hospital Stay: Payer: No Typology Code available for payment source

## 2022-07-29 VITALS — BP 125/84 | HR 76 | Temp 97.9°F | Resp 18

## 2022-07-29 DIAGNOSIS — C20 Malignant neoplasm of rectum: Secondary | ICD-10-CM | POA: Diagnosis not present

## 2022-07-29 DIAGNOSIS — Z95828 Presence of other vascular implants and grafts: Secondary | ICD-10-CM

## 2022-07-29 DIAGNOSIS — Z5189 Encounter for other specified aftercare: Secondary | ICD-10-CM | POA: Diagnosis not present

## 2022-07-29 DIAGNOSIS — Z5111 Encounter for antineoplastic chemotherapy: Secondary | ICD-10-CM | POA: Diagnosis not present

## 2022-07-29 MED ORDER — HEPARIN SOD (PORK) LOCK FLUSH 100 UNIT/ML IV SOLN
500.0000 [IU] | Freq: Once | INTRAVENOUS | Status: AC | PRN
  Administered 2022-07-29: 500 [IU]

## 2022-07-29 MED ORDER — PEGFILGRASTIM-JMDB 6 MG/0.6ML ~~LOC~~ SOSY
6.0000 mg | PREFILLED_SYRINGE | Freq: Once | SUBCUTANEOUS | Status: AC
  Administered 2022-07-29: 6 mg via SUBCUTANEOUS
  Filled 2022-07-29: qty 0.6

## 2022-07-29 MED ORDER — SODIUM CHLORIDE 0.9% FLUSH
10.0000 mL | INTRAVENOUS | Status: DC | PRN
  Administered 2022-07-29: 10 mL

## 2022-07-29 NOTE — Progress Notes (Signed)
Troy Dillon presents to have home infusion pump d/c'd and for port-a-cath deaccess with flush.  Portacath located right chest wall accessed with  H 20 needle.  Good blood return present. Portacath flushed with NS and 500U/24m Heparin, and needle removed intact.  Procedure tolerated well and without incident.  Fulphila injection give as ordered. Vitals stable and discharged home from clinic ambulatory. Follow up as scheduled.

## 2022-07-29 NOTE — Patient Instructions (Signed)
[facility]  Discharge Instructions: Thank you for choosing [facility] to provide your oncology and hematology care.  If you have a lab appointment with the [facility], please come in thru the Main Entrance and check in at the main information desk.  Wear comfortable clothing and clothing appropriate for easy access to any Portacath or PICC line.   We strive to give you quality time with your provider. You may need to reschedule your appointment if you arrive late (15 or more minutes).  Arriving late affects you and other patients whose appointments are after yours.  Also, if you miss three or more appointments without notifying the office, you may be dismissed from the clinic at the provider's discretion.      For prescription refill requests, have your pharmacy contact our office and allow 72 hours for refills to be completed.    Today you had your ambulatory pump removed and injection per MD orders   To help prevent nausea and vomiting after your treatment, we encourage you to take your nausea medication as directed.  BELOW ARE SYMPTOMS THAT SHOULD BE REPORTED IMMEDIATELY: *FEVER GREATER THAN 100.4 F (38 C) OR HIGHER *CHILLS OR SWEATING *NAUSEA AND VOMITING THAT IS NOT CONTROLLED WITH YOUR NAUSEA MEDICATION *UNUSUAL SHORTNESS OF BREATH *UNUSUAL BRUISING OR BLEEDING *URINARY PROBLEMS (pain or burning when urinating, or frequent urination) *BOWEL PROBLEMS (unusual diarrhea, constipation, pain near the anus) TENDERNESS IN MOUTH AND THROAT WITH OR WITHOUT PRESENCE OF ULCERS (sore throat, sores in mouth, or a toothache) UNUSUAL RASH, SWELLING OR PAIN  UNUSUAL VAGINAL DISCHARGE OR ITCHING   Items with * indicate a potential emergency and should be followed up as soon as possible or go to the Emergency Department if any problems should occur.  Please show the CHEMOTHERAPY ALERT CARD or IMMUNOTHERAPY ALERT CARD at check-in to the Emergency Department  and triage nurse.  Should you have questions after your visit or need to cancel or reschedule your appointment, please contact [facility] [phone]  and follow the prompts.  Office hours are 8:00 a.m. to 4:30 p.m. Monday - Friday. Please note that voicemails left after 4:00 p.m. may not be returned until the following business day.  We are closed weekends and major holidays. You have access to a nurse at all times for urgent questions. Please call the main number to the clinic [phone] and follow the prompts.  For any non-urgent questions, you may also contact your provider using MyChart. We now offer e-Visits for anyone 67 and older to request care online for non-urgent symptoms. For details visit [email or web address].   Also download the MyChart app! Go to the app store, search "MyChart", open the app, select [facility], and log in with your MyChart username and password.  Masks are optional in the cancer centers. If you would like for your care team to wear a mask while they are taking care of you, please let them know. You may have one support person who is at least 51 years old accompany you for your appointments.

## 2022-08-04 DIAGNOSIS — H401331 Pigmentary glaucoma, bilateral, mild stage: Secondary | ICD-10-CM | POA: Diagnosis not present

## 2022-08-04 DIAGNOSIS — H40053 Ocular hypertension, bilateral: Secondary | ICD-10-CM | POA: Diagnosis not present

## 2022-08-04 DIAGNOSIS — H40043 Steroid responder, bilateral: Secondary | ICD-10-CM | POA: Diagnosis not present

## 2022-08-04 DIAGNOSIS — Z961 Presence of intraocular lens: Secondary | ICD-10-CM | POA: Diagnosis not present

## 2022-08-07 ENCOUNTER — Other Ambulatory Visit: Payer: Self-pay

## 2022-08-10 ENCOUNTER — Inpatient Hospital Stay: Payer: No Typology Code available for payment source | Admitting: Dietician

## 2022-08-10 ENCOUNTER — Inpatient Hospital Stay: Payer: No Typology Code available for payment source

## 2022-08-10 ENCOUNTER — Inpatient Hospital Stay (HOSPITAL_BASED_OUTPATIENT_CLINIC_OR_DEPARTMENT_OTHER): Payer: No Typology Code available for payment source | Admitting: Hematology

## 2022-08-10 VITALS — BP 139/92 | HR 67 | Temp 98.2°F | Resp 18

## 2022-08-10 DIAGNOSIS — Z5111 Encounter for antineoplastic chemotherapy: Secondary | ICD-10-CM | POA: Diagnosis not present

## 2022-08-10 DIAGNOSIS — C20 Malignant neoplasm of rectum: Secondary | ICD-10-CM | POA: Diagnosis not present

## 2022-08-10 DIAGNOSIS — Z95828 Presence of other vascular implants and grafts: Secondary | ICD-10-CM

## 2022-08-10 DIAGNOSIS — Z5189 Encounter for other specified aftercare: Secondary | ICD-10-CM | POA: Diagnosis not present

## 2022-08-10 LAB — COMPREHENSIVE METABOLIC PANEL WITH GFR
ALT: 39 U/L (ref 0–44)
AST: 34 U/L (ref 15–41)
Albumin: 3.5 g/dL (ref 3.5–5.0)
Alkaline Phosphatase: 156 U/L — ABNORMAL HIGH (ref 38–126)
Anion gap: 7 (ref 5–15)
BUN: 11 mg/dL (ref 6–20)
CO2: 24 mmol/L (ref 22–32)
Calcium: 8.6 mg/dL — ABNORMAL LOW (ref 8.9–10.3)
Chloride: 109 mmol/L (ref 98–111)
Creatinine, Ser: 0.99 mg/dL (ref 0.61–1.24)
GFR, Estimated: >60 mL/min
Glucose, Bld: 105 mg/dL — ABNORMAL HIGH (ref 70–99)
Potassium: 4.1 mmol/L (ref 3.5–5.1)
Sodium: 140 mmol/L (ref 135–145)
Total Bilirubin: 0.6 mg/dL (ref 0.3–1.2)
Total Protein: 6.4 g/dL — ABNORMAL LOW (ref 6.5–8.1)

## 2022-08-10 LAB — MAGNESIUM: Magnesium: 2 mg/dL (ref 1.7–2.4)

## 2022-08-10 LAB — CBC WITH DIFFERENTIAL/PLATELET
Abs Immature Granulocytes: 0.27 10*3/uL — ABNORMAL HIGH (ref 0.00–0.07)
Basophils Absolute: 0.1 10*3/uL (ref 0.0–0.1)
Basophils Relative: 2 %
Eosinophils Absolute: 0.1 10*3/uL (ref 0.0–0.5)
Eosinophils Relative: 3 %
HCT: 40.2 % (ref 39.0–52.0)
Hemoglobin: 14.4 g/dL (ref 13.0–17.0)
Immature Granulocytes: 6 %
Lymphocytes Relative: 29 %
Lymphs Abs: 1.2 10*3/uL (ref 0.7–4.0)
MCH: 32.4 pg (ref 26.0–34.0)
MCHC: 35.8 g/dL (ref 30.0–36.0)
MCV: 90.5 fL (ref 80.0–100.0)
Monocytes Absolute: 0.6 10*3/uL (ref 0.1–1.0)
Monocytes Relative: 14 %
Neutro Abs: 1.9 10*3/uL (ref 1.7–7.7)
Neutrophils Relative %: 46 %
Platelets: 125 10*3/uL — ABNORMAL LOW (ref 150–400)
RBC: 4.44 MIL/uL (ref 4.22–5.81)
RDW: 14.7 % (ref 11.5–15.5)
WBC: 4.2 10*3/uL (ref 4.0–10.5)
nRBC: 0 % (ref 0.0–0.2)

## 2022-08-10 MED ORDER — DEXTROSE 5 % IV SOLN: Freq: Once | INTRAVENOUS | Status: AC

## 2022-08-10 MED ORDER — PALONOSETRON HCL INJECTION 0.25 MG/5ML
0.2500 mg | Freq: Once | INTRAVENOUS | Status: AC
  Administered 2022-08-10: 0.25 mg via INTRAVENOUS
  Filled 2022-08-10: qty 5

## 2022-08-10 MED ORDER — LEUCOVORIN CALCIUM INJECTION 350 MG
400.0000 mg/m2 | Freq: Once | INTRAVENOUS | Status: AC
  Administered 2022-08-10: 800 mg via INTRAVENOUS
  Filled 2022-08-10: qty 40

## 2022-08-10 MED ORDER — OXALIPLATIN CHEMO INJECTION 100 MG/20ML
85.0000 mg/m2 | Freq: Once | INTRAVENOUS | Status: AC
  Administered 2022-08-10: 170 mg via INTRAVENOUS
  Filled 2022-08-10: qty 34

## 2022-08-10 MED ORDER — SODIUM CHLORIDE 0.9 % IV SOLN
2500.0000 mg/m2 | INTRAVENOUS | Status: DC
  Administered 2022-08-10: 5000 mg via INTRAVENOUS
  Filled 2022-08-10: qty 100

## 2022-08-10 MED ORDER — SODIUM CHLORIDE 0.9% FLUSH
10.0000 mL | Freq: Once | INTRAVENOUS | Status: AC
  Administered 2022-08-10: 10 mL via INTRAVENOUS

## 2022-08-10 MED ORDER — ONDANSETRON HCL 8 MG PO TABS: 8.0000 mg | ORAL_TABLET | Freq: Three times a day (TID) | ORAL | 2 refills | Status: DC | PRN

## 2022-08-10 MED ORDER — FLUOROURACIL CHEMO INJECTION 2.5 GM/50ML
400.0000 mg/m2 | Freq: Once | INTRAVENOUS | Status: AC
  Administered 2022-08-10: 800 mg via INTRAVENOUS
  Filled 2022-08-10: qty 16

## 2022-08-10 MED ORDER — SODIUM CHLORIDE 0.9 % IV SOLN
10.0000 mg | Freq: Once | INTRAVENOUS | Status: AC
  Administered 2022-08-10: 10 mg via INTRAVENOUS
  Filled 2022-08-10: qty 10

## 2022-08-10 MED ORDER — SODIUM CHLORIDE 0.9 % IV SOLN
150.0000 mg | Freq: Once | INTRAVENOUS | Status: AC
  Administered 2022-08-10: 150 mg via INTRAVENOUS
  Filled 2022-08-10: qty 5

## 2022-08-10 NOTE — Patient Instructions (Signed)
[facility] at [facility] Discharge Instructions   You were seen and examined today by Dr. Katragadda.  He reviewed the results of your lab work which are normal/stable.   We will proceed with your treatment today.  Return as scheduled.    Thank you for choosing [facility] at [facility] to provide your oncology and hematology care.  To afford each patient quality time with our provider, please arrive at least 15 minutes before your scheduled appointment time.   If you have a lab appointment with the Cancer Center please come in thru the Main Entrance and check in at the main information desk.  You need to re-schedule your appointment should you arrive 10 or more minutes late.  We strive to give you quality time with our providers, and arriving late affects you and other patients whose appointments are after yours.  Also, if you no show three or more times for appointments you may be dismissed from the clinic at the providers discretion.     Again, thank you for choosing [facility].  Our hope is that these requests will decrease the amount of time that you wait before being seen by our physicians.       _____________________________________________________________  Should you have questions after your visit to [facility], please contact our office at [phone] and follow the prompts.  Our office hours are 8:00 a.m. and 4:30 p.m. Monday - Friday.  Please note that voicemails left after 4:00 p.m. may not be returned until the following business day.  We are closed weekends and major holidays.  You do have access to a nurse 24-7, just call the main number to the clinic [phone] and do not press any options, hold on the line and a nurse will answer the phone.    For prescription refill requests, have your pharmacy contact our office and allow 72 hours.    Due to Covid, you will need to wear a mask upon entering  the hospital. If you do not have a mask, a mask will be given to you at the Main Entrance upon arrival. For doctor visits, patients may have 1 support person age 18 or older with them. For treatment visits, patients can not have anyone with them due to social distancing guidelines and our immunocompromised population.      

## 2022-08-10 NOTE — Progress Notes (Signed)
Pt presents today for Folfox per provider's order. Vital signs and labs WNL treatment. Okay to proceed with treatment today per Dr.K.  Folfox given today per MD orders. Tolerated infusion without adverse affects. Vital signs stable. No complaints at this time. Discharged from clinic ambulatory in stable condition. Alert and oriented x 3. F/U with [facility] as scheduled.  5FU ambulatory pump infusing.

## 2022-08-10 NOTE — Progress Notes (Signed)
Nutrition Follow-up:  Patient with stage IIIB rectal cancer. He is receiving neoadjuvant chemotherapy with FOLOFOX q14d x 4 months (first 11/6) followed by concurrent radiation with xeloda. Plans for adjuvant surgery.    Met with patient and wife during infusion. 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 Patient has not started taking gabapentin. He has been eating a variety of foods with good sources of protein. Patient has developed a "sweet tooth" per wife. He does not like the taste of water. He is drinking coffee, kool-aid, orange juice, hot chocolate made with milk. Patient denies constipation, diarrhea, vomiting. He reports nausea lasting ~3 days after treatment. This is well controlled with antiemetics.    Medications: reviewed   Labs: reviewed   Anthropometrics: Wt 177 lb 9.6 oz today stable   12/4 - 177 lb 14.6 oz 11/20 - 179 lb 11/6 - 179 lb 3.2 oz   NUTRITION DIAGNOSIS: Food and nutrition related knowledge deficit improved   INTERVENTION:  Continue strategies for increasing calories and protein with small frequent meals and snacks     MONITORING, EVALUATION, GOAL: weight trends, intake    NEXT VISIT: Monday January 15 during infusion

## 2022-08-10 NOTE — Progress Notes (Signed)
Troy Dillon, Troy Dillon [id number]   CLINIC:  Medical Oncology/Hematology  PCP:  Practice, [facility] [place] [address] [phone]   REASON FOR VISIT:  Follow-up for stage III rectal cancer  PRIOR THERAPY: None  NGS Results: MSI-stable, MMR-preserved  CURRENT THERAPY: FOLFOX  BRIEF ONCOLOGIC HISTORY:  Oncology History  Rectal carcinoma ([place])  06/17/2022 Initial Diagnosis   Rectal carcinoma ([place])   06/29/2022 -  Chemotherapy   Patient is on Treatment Plan : COLORECTAL FOLFOX q14d x 4 months       CANCER STAGING:  Cancer Staging  Rectal carcinoma ([place]) Staging form: Colon and Rectum, AJCC 8th Edition - Clinical stage from 06/18/2022: Stage IIIB (cT3, cN1, cM0) - Unsigned    INTERVAL HISTORY:  Troy Dillon 51 y.o. male seen for follow-up of rectal cancer and toxicity assessment prior to cycle 4 of chemotherapy.  He has experienced some nausea during the first week without any vomiting.  He takes Zofran twice a day with Compazine in the middle of the day.  Compazine usually causes drowsiness although it helps with nausea.  Energy levels are 100%.  Reported feeling tightness in the right leg when he drives.   REVIEW OF SYSTEMS:  Review of Systems  Gastrointestinal:  Positive for nausea (During first week).  All other systems reviewed and are negative.    PAST MEDICAL/SURGICAL HISTORY:  Past Medical History:  Diagnosis Date   Rectal bleeding    Rectal cancer ([place]) 06/08/2022   Past Surgical History:  Procedure Laterality Date   COLONOSCOPY  06/08/2022   EYE SURGERY     FRACTURE SURGERY     Right Femur- Cables in bone remain in place, rod removed in 2000   IR IMAGING GUIDED PORT INSERTION  06/22/2022   ORTHOPEDIC SURGERY       SOCIAL HISTORY:  Social History   Socioeconomic History   Marital status: Married    Spouse name: Not on file   Number of children: Not on file   Years of education: Not on  file   Highest education level: Not on file  Occupational History   Not on file  Tobacco Use   Smoking status: Former    Types: Cigarettes    Quit date: 2003    Years since quitting: 20.9   Smokeless tobacco: Current    Types: Snuff   Tobacco comments:    Some Dipping  Vaping Use   Vaping Use: Never used  Substance and Sexual Activity   Alcohol use: Yes   Drug use: Never   Sexual activity: Not on file  Other Topics Concern   Not on file  Social History Narrative   Not on file   Social Determinants of Health   Financial Resource Strain: Not on file  Food Insecurity: Not on file  Transportation Needs: Not on file  Physical Activity: Not on file  Stress: Not on file  Social Connections: Not on file  Intimate Partner Violence: Not on file    FAMILY HISTORY:  Family History  Problem Relation Age of Onset   Cancer Mother        GYN malignancy details unknown   Colon cancer Father    Heart attack Father    Colon cancer Paternal Aunt    Colon cancer Paternal Grandmother    Colon cancer Paternal Grandfather     CURRENT MEDICATIONS:  Outpatient Encounter Medications as of 08/10/2022  Medication Sig   Brinzolamide-Brimonidine  1-0.2 % SUSP    dextrose 5 % SOLN 1,000 mL with fluorouracil 5 GM/100ML SOLN Inject into the vein over 48 hr. Every 14 days   FLUOROURACIL IV Inject into the vein every 14 (fourteen) days.   ibuprofen (ADVIL) 200 MG tablet Take 200 mg by mouth every 6 (six) hours as needed for moderate pain.   LEUCOVORIN CALCIUM IV Inject into the vein every 14 (fourteen) days.   Multiple Vitamin (MULTIVITAMIN) tablet Take 1 tablet by mouth daily.   OXALIPLATIN IV Inject into the vein every 14 (fourteen) days.   Salicylic Acid, Acne, (SALICYLIC ACID EX) Apply 1 Application topically daily as needed (acne).   sodium chloride (OCEAN) 0.65 % SOLN nasal spray Place 1 spray into both nostrils as needed for congestion.   lidocaine-prilocaine (EMLA) cream Apply a small  amount to port a cath site and cover with plastic wrap one hour prior to infusion appointments   ondansetron (ZOFRAN) 8 MG tablet Take 1 tablet (8 mg total) by mouth every 8 (eight) hours as needed.   prochlorperazine (COMPAZINE) 10 MG tablet Take 1 tablet (10 mg total) by mouth every 6 (six) hours as needed for nausea or vomiting.   promethazine (PHENERGAN) 25 MG suppository Place 1 suppository (25 mg total) rectally every 6 (six) hours as needed for nausea or vomiting.   [DISCONTINUED] ondansetron (ZOFRAN) 8 MG tablet TAKE 1 TABLET BY MOUTH EVERY 8 HOURS AS NEEDED FOR NAUSEA OR VOMITING. (Patient not taking: Reported on 08/10/2022)   Facility-Administered Encounter Medications as of 08/10/2022  Medication   [COMPLETED] sodium chloride flush (NS) 0.9 % injection 10 mL    ALLERGIES:  Allergies  Allergen Reactions   Hydrocodone Itching   Latex     Eye irritation      PHYSICAL EXAM:  ECOG Performance status: 0  There were no vitals filed for this visit. There were no vitals filed for this visit. Physical Exam Vitals reviewed.  Constitutional:      Appearance: Normal appearance.  Cardiovascular:     Rate and Rhythm: Normal rate and regular rhythm.     Pulses: Normal pulses.     Heart sounds: Normal heart sounds.  Pulmonary:     Effort: Pulmonary effort is normal.     Breath sounds: Normal breath sounds.  Neurological:     Mental Status: He is alert.  Psychiatric:        Mood and Affect: Mood normal.        Behavior: Behavior normal.      LABORATORY DATA:  I have reviewed the labs as listed.  CBC    Component Value Date/Time   WBC 4.2 08/10/2022 0843   RBC 4.44 08/10/2022 0843   HGB 14.4 08/10/2022 0843   HCT 40.2 08/10/2022 0843   PLT 125 (L) 08/10/2022 0843   MCV 90.5 08/10/2022 0843   MCH 32.4 08/10/2022 0843   MCHC 35.8 08/10/2022 0843   RDW 14.7 08/10/2022 0843   LYMPHSABS 1.2 08/10/2022 0843   MONOABS 0.6 08/10/2022 0843   EOSABS 0.1 08/10/2022 0843    BASOSABS 0.1 08/10/2022 0843      Latest Ref Rng & Units 08/10/2022    8:43 AM 07/27/2022    8:34 AM 07/13/2022    8:26 AM  CMP  Glucose 70 - 99 mg/dL 105  121  148   BUN 6 - 20 mg/dL _0 Creatinine 0.61 - 1.24 mg/dL 0.99  0.98  1.04   Sodium  135 - 145 mmol/L 140  139  138   Potassium 3.5 - 5.1 mmol/L 4.1  3.9  3.5   Chloride 98 - 111 mmol/L 109  108  108   CO2 22 - 32 mmol/L _0 Calcium 8.9 - 10.3 mg/dL 8.6  8.3  8.3   Total Protein 6.5 - 8.1 g/dL 6.4  6.5  6.5   Total Bilirubin 0.3 - 1.2 mg/dL 0.6  0.5  1.0   Alkaline Phos 38 - 126 U/L 156  133  105   AST 15 - 41 U/L 34  35  25   ALT 0 - 44 U/L 39  36  22     DIAGNOSTIC IMAGING:  I have independently reviewed the scans and discussed with the patient.  ASSESSMENT:  1.  Stage IIIB (T3cN1) rectal adenocarcinoma, MSI stable: - Presentation with rectal bleeding - Colonoscopy (06/08/2022): Fungating partially obstructing large mass found in the rectum 11 cm from anal verge.  Partially circumferential involving one half of the lumen.  Mass measured 3 cm in length.  Diameter 2 cm.  Oozing present.  1 semipedunculated polyp (40 cm from anus) and 3 sessile polyps in the cecum removed. - Pathology (06/08/2022): Rectal mass 11 cm from anal verge-adenocarcinoma.  MMR preserved.  MSI-stable. - CEA (06/08/2022): 2.9 - MRI pelvis (06/10/2022): T3CN1, tumor extends into sigmoid from the high rectum, extension through muscularis propria, approximately 6 mm beyond the rectal wall.  Tumor begins in the highly portion of the rectum and extends into the sigmoid colon.  Extramural vascular invasion/tumor thrombus along the right lateral margin.  Shortest distance of tumor from mesorectal fascia: 15 mm.  Single high mesorectal or superior rectal lymph node measuring 9 mm.  Distance from tumor to internal anal sphincter is approximately 7-11 cm. - CT CAP (06/10/2022): Circumferential wall thickening of the upper/mid rectum, mildly  enlarged high perirectal lymph nodes measuring up to 6 mm in short axis.  No evidence of distant metastatic disease in the chest, abdomen or pelvis. - Neoadjuvant FOLFOX cycle 1 on 06/29/2022   2.  Social/family history: - He lives at home with his wife Lattie Haw.  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  Denies any chemical exposure.  Does not smoke cigarettes but dips tobacco. - Father had colon cancer at age 30. - Paternal grandmother died of colon cancer. - Paternal aunt had colon cancer. - Mother had "male cancer" and underwent hysterectomy.    PLAN:  1.  Stage IIIb (T3CN1) rectal adenocarcinoma, MSI-stable: - He felt tightness in the right leg when he was driving which subsided.  Could be early sign of neuropathy.  Will keep close tabs on it. - Reviewed labs today which showed alk phos elevated at 156 last 2 times.  Other LFTs are normal.  CBC shows platelet count is 125 with normal white count. - Proceed with cycle 4 today without any dose modifications.  RTC 2 weeks for follow-up with repeat labs.  2.  Nausea/vomiting: - He has experienced nausea during the first week. - Continue Zofran 8 mg in the morning and evening with Compazine at midday. - He will also use Phenergan suppositories as needed.      Orders placed this encounter:  No orders of the defined types were placed in this encounter.     Derek Jack, MD [place] [phone]

## 2022-08-10 NOTE — Patient Instructions (Signed)
[facility]  Discharge Instructions: Thank you for choosing [facility] to provide your oncology and hematology care.  If you have a lab appointment with the [facility], please come in thru the Main Entrance and check in at the main information desk.  Wear comfortable clothing and clothing appropriate for easy access to any Portacath or PICC line.   We strive to give you quality time with your provider. You may need to reschedule your appointment if you arrive late (15 or more minutes).  Arriving late affects you and other patients whose appointments are after yours.  Also, if you miss three or more appointments without notifying the office, you may be dismissed from the clinic at the provider's discretion.      For prescription refill requests, have your pharmacy contact our office and allow 72 hours for refills to be completed.    Today you received the following chemotherapy and/or immunotherapy agents Folfox   To help prevent nausea and vomiting after your treatment, we encourage you to take your nausea medication as directed.  BELOW ARE SYMPTOMS THAT SHOULD BE REPORTED IMMEDIATELY: *FEVER GREATER THAN 100.4 F (38 C) OR HIGHER *CHILLS OR SWEATING *NAUSEA AND VOMITING THAT IS NOT CONTROLLED WITH YOUR NAUSEA MEDICATION *UNUSUAL SHORTNESS OF BREATH *UNUSUAL BRUISING OR BLEEDING *URINARY PROBLEMS (pain or burning when urinating, or frequent urination) *BOWEL PROBLEMS (unusual diarrhea, constipation, pain near the anus) TENDERNESS IN MOUTH AND THROAT WITH OR WITHOUT PRESENCE OF ULCERS (sore throat, sores in mouth, or a toothache) UNUSUAL RASH, SWELLING OR PAIN  UNUSUAL VAGINAL DISCHARGE OR ITCHING   Items with * indicate a potential emergency and should be followed up as soon as possible or go to the Emergency Department if any problems should occur.  Please show the CHEMOTHERAPY ALERT CARD or IMMUNOTHERAPY ALERT CARD at check-in to the Emergency  Department and triage nurse.  Should you have questions after your visit or need to cancel or reschedule your appointment, please contact [facility] [phone]  and follow the prompts.  Office hours are 8:00 a.m. to 4:30 p.m. Monday - Friday. Please note that voicemails left after 4:00 p.m. may not be returned until the following business day.  We are closed weekends and major holidays. You have access to a nurse at all times for urgent questions. Please call the main number to the clinic [phone] and follow the prompts.  For any non-urgent questions, you may also contact your provider using MyChart. We now offer e-Visits for anyone 86 and older to request care online for non-urgent symptoms. For details visit [email or web address].   Also download the MyChart app! Go to the app store, search "MyChart", open the app, select [facility], and log in with your MyChart username and password.  Masks are optional in the cancer centers. If you would like for your care team to wear a mask while they are taking care of you, please let them know. You may have one support person who is at least 51 years old accompany you for your appointments.

## 2022-08-12 ENCOUNTER — Inpatient Hospital Stay: Payer: No Typology Code available for payment source

## 2022-08-12 VITALS — BP 124/77 | HR 73 | Temp 98.0°F | Resp 18

## 2022-08-12 DIAGNOSIS — C20 Malignant neoplasm of rectum: Secondary | ICD-10-CM | POA: Diagnosis not present

## 2022-08-12 DIAGNOSIS — Z95828 Presence of other vascular implants and grafts: Secondary | ICD-10-CM

## 2022-08-12 DIAGNOSIS — Z5189 Encounter for other specified aftercare: Secondary | ICD-10-CM | POA: Diagnosis not present

## 2022-08-12 DIAGNOSIS — Z5111 Encounter for antineoplastic chemotherapy: Secondary | ICD-10-CM | POA: Diagnosis not present

## 2022-08-12 MED ORDER — SODIUM CHLORIDE 0.9% FLUSH
10.0000 mL | INTRAVENOUS | Status: DC | PRN
  Administered 2022-08-12: 10 mL

## 2022-08-12 MED ORDER — PEGFILGRASTIM-JMDB 6 MG/0.6ML ~~LOC~~ SOSY
6.0000 mg | PREFILLED_SYRINGE | Freq: Once | SUBCUTANEOUS | Status: AC
  Administered 2022-08-12: 6 mg via SUBCUTANEOUS
  Filled 2022-08-12: qty 0.6

## 2022-08-12 MED ORDER — HEPARIN SOD (PORK) LOCK FLUSH 100 UNIT/ML IV SOLN
500.0000 [IU] | Freq: Once | INTRAVENOUS | Status: AC | PRN
  Administered 2022-08-12: 500 [IU]

## 2022-08-12 NOTE — Progress Notes (Signed)
Patient presents today for 5FU pump stop and disconnection after 46 hour continous infusion.   5FU pump deaccessed.  Patients port flushed without difficulty.  Good blood return noted with no bruising or swelling noted at site.  Needle removed intact.  Band aid applied.  Udenyca administration without incident; injection site WNL; see MAR for injection details.  Patient tolerated procedure well and without incident.  No questions or complaints noted at this time. VSS with discharge and left in satisfactory condition via wheelchair with no s/s of distress noted.    

## 2022-08-12 NOTE — Patient Instructions (Signed)
[facility]  Discharge Instructions: Thank you for choosing [facility] to provide your oncology and hematology care.  If you have a lab appointment with the Cancer Center, please come in thru the Main Entrance and check in at the main information desk.  Wear comfortable clothing and clothing appropriate for easy access to any Portacath or PICC line.   We strive to give you quality time with your provider. You may need to reschedule your appointment if you arrive late (15 or more minutes).  Arriving late affects you and other patients whose appointments are after yours.  Also, if you miss three or more appointments without notifying the office, you may be dismissed from the clinic at the provider's discretion.      For prescription refill requests, have your pharmacy contact our office and allow 72 hours for refills to be completed.    Today you received the following chemotherapy and/or immunotherapy agents Udenyca      To help prevent nausea and vomiting after your treatment, we encourage you to take your nausea medication as directed.  BELOW ARE SYMPTOMS THAT SHOULD BE REPORTED IMMEDIATELY: *FEVER GREATER THAN 100.4 F (38 C) OR HIGHER *CHILLS OR SWEATING *NAUSEA AND VOMITING THAT IS NOT CONTROLLED WITH YOUR NAUSEA MEDICATION *UNUSUAL SHORTNESS OF BREATH *UNUSUAL BRUISING OR BLEEDING *URINARY PROBLEMS (pain or burning when urinating, or frequent urination) *BOWEL PROBLEMS (unusual diarrhea, constipation, pain near the anus) TENDERNESS IN MOUTH AND THROAT WITH OR WITHOUT PRESENCE OF ULCERS (sore throat, sores in mouth, or a toothache) UNUSUAL RASH, SWELLING OR PAIN  UNUSUAL VAGINAL DISCHARGE OR ITCHING   Items with * indicate a potential emergency and should be followed up as soon as possible or go to the Emergency Department if any problems should occur.  Please show the CHEMOTHERAPY ALERT CARD or IMMUNOTHERAPY ALERT CARD at check-in to the Emergency  Department and triage nurse.  Should you have questions after your visit or need to cancel or reschedule your appointment, please contact [facility] [phone]  and follow the prompts.  Office hours are 8:00 a.m. to 4:30 p.m. Monday - Friday. Please note that voicemails left after 4:00 p.m. may not be returned until the following business day.  We are closed weekends and major holidays. You have access to a nurse at all times for urgent questions. Please call the main number to the clinic [phone] and follow the prompts.  For any non-urgent questions, you may also contact your provider using MyChart. We now offer e-Visits for anyone 18 and older to request care online for non-urgent symptoms. For details visit [email or web address].   Also download the MyChart app! Go to the app store, search "MyChart", open the app, select [facility], and log in with your MyChart username and password.  Masks are optional in the cancer centers. If you would like for your care team to wear a mask while they are taking care of you, please let them know. You may have one support person who is at least 51 years old accompany you for your appointments.  

## 2022-08-19 ENCOUNTER — Encounter: Payer: Self-pay | Admitting: Hematology

## 2022-08-20 ENCOUNTER — Other Ambulatory Visit: Payer: Self-pay | Admitting: Genetic Counselor

## 2022-08-20 ENCOUNTER — Inpatient Hospital Stay (HOSPITAL_BASED_OUTPATIENT_CLINIC_OR_DEPARTMENT_OTHER): Payer: No Typology Code available for payment source | Admitting: Genetic Counselor

## 2022-08-20 ENCOUNTER — Inpatient Hospital Stay: Payer: No Typology Code available for payment source

## 2022-08-20 ENCOUNTER — Other Ambulatory Visit: Payer: Self-pay

## 2022-08-20 ENCOUNTER — Encounter: Payer: Self-pay | Admitting: Genetic Counselor

## 2022-08-20 DIAGNOSIS — Z1379 Encounter for other screening for genetic and chromosomal anomalies: Secondary | ICD-10-CM

## 2022-08-20 DIAGNOSIS — Z800 Family history of malignant neoplasm of digestive organs: Secondary | ICD-10-CM | POA: Diagnosis not present

## 2022-08-20 DIAGNOSIS — C20 Malignant neoplasm of rectum: Secondary | ICD-10-CM

## 2022-08-20 LAB — GENETIC SCREENING ORDER

## 2022-08-20 NOTE — Progress Notes (Signed)
REFERRING PROVIDER: Derek Jack, MD  PRIMARY PROVIDER:  P[facility], [facility]  PRIMARY REASON FOR VISIT:  Encounter Diagnoses  Name Primary?   Rectal carcinoma (Troy Dillon) Yes   Family history of colon cancer    HISTORY OF PRESENT ILLNESS:   Troy Dillon, a 51 y.o. male, was seen for a [facility] cancer genetics consultation at the request of Dr. Delton Coombes due to a personal and family history of cancer.  [name] presents to clinic today to discuss the possibility of a hereditary predisposition to cancer, to discuss genetic testing, and to further clarify his future cancer risks, as well as potential cancer risks for family members.   [name] was diagnosed with rectal cancer at age 45 (MSI stable/MMR preserved).  CANCER HISTORY:  Oncology History  Rectal carcinoma ([place])  06/17/2022 Initial Diagnosis   Rectal carcinoma ([place])   06/29/2022 -  Chemotherapy   Patient is on Treatment Plan : COLORECTAL FOLFOX q14d x 4 months       Past Medical History:  Diagnosis Date   Rectal bleeding    Rectal cancer ([place]) 06/08/2022    Past Surgical History:  Procedure Laterality Date   COLONOSCOPY  06/08/2022   EYE SURGERY     FRACTURE SURGERY     Right Femur- Cables in bone remain in place, rod removed in 2000   IR IMAGING GUIDED PORT INSERTION  06/22/2022   ORTHOPEDIC SURGERY      Social History   Socioeconomic History   Marital status: Married    Spouse name: Not on file   Number of children: Not on file   Years of education: Not on file   Highest education level: Not on file  Occupational History   Not on file  Tobacco Use   Smoking status: Former    Types: Cigarettes    Quit date: 2003    Years since quitting: 21.0   Smokeless tobacco: Current    Types: Snuff   Tobacco comments:    Some Dipping  Vaping Use   Vaping Use: Never used  Substance and Sexual Activity   Alcohol use: Yes   Drug use: Never   Sexual activity: Not on file  Other Topics  Concern   Not on file  Social History Narrative   Not on file   Social Determinants of Health   Financial Resource Strain: Not on file  Food Insecurity: Not on file  Transportation Needs: Not on file  Physical Activity: Not on file  Stress: Not on file  Social Connections: Not on file     FAMILY HISTORY:  We obtained a detailed, 4-generation family history.  Significant diagnoses are listed below: Family History  Problem Relation Age of Onset   Cancer Mother 48 - 66       GYN malignancy details unknown, TAH/BSO   Colon cancer Father 60   Heart attack Father    Colon cancer Paternal Aunt 40   Colon cancer Paternal Grandmother 34     Troy Dillon father was diagnosed with colon cancer at age 82, he died at age 15. Troy Dillon has three paternal aunts. One paternal aunt was diagnosed with colon cancer at age 85 and died at age 82. His paternal grandmother was diagnosed with colon cancer at age 33 and died at age 37 due to metastatic colon cancer. His mother was diagnosed with a male reproductive cancer and had a TAH/BSO as part of her treatment, she is 32. Troy Dillon is unaware of previous  family history of genetic testing for hereditary cancer risks. There is no reported Ashkenazi Jewish ancestry.   GENETIC COUNSELING ASSESSMENT: [name] is a 51 y.o. male with a personal and family history of cancer which is somewhat suggestive of a hereditary predisposition to cancer given multiple individuals diagnosed with colorectal cancer. We, therefore, discussed and recommended the following at today's visit.   DISCUSSION: We discussed that 5 - 10% of cancer is hereditary, with most cases of colorectal cancer associated with Lynch Syndrome.  There are other genes that can be associated with hereditary colorectal cancer syndromes.  We discussed that testing is beneficial for several reasons including knowing how to follow individuals after completing their treatment and understanding if  other family members could be at an increased risk for cancer.  We reviewed the characteristics, features and inheritance patterns of hereditary cancer syndromes. We also discussed genetic testing, including the appropriate family members to test, the process of testing, insurance coverage and turn-around-time for results. We discussed the implications of a negative, positive, carrier and/or variant of uncertain significant result. We recommended Troy Dillon pursue genetic testing for a panel that includes genes associated with colorectal cancer.   Troy Dillon  was offered a common hereditary cancer panel (48 genes) and an expanded pan-cancer panel (70 genes). Troy Dillon was informed of the benefits and limitations of each panel, including that expanded pan-cancer panels contain genes that do not have clear management guidelines at this point in time.  We also discussed that as the number of genes included on a panel increases, the chances of variants of uncertain significance increases. After considering the benefits and limitations of each gene panel, Troy Dillon elected to have [facility] Common Cancer Panel.  The Common Hereditary Cancers Panel offered by [facility] includes sequencing and/or deletion duplication testing of the following 48 genes: APC, ATM, AXIN2, BAP1, BARD1, BMPR1A, BRCA1, BRCA2, BRIP1, CDH1, CDK4, CDKN2A (p14ARF and p16INK4a only), CHEK2, CTNNA1, DICER1, EPCAM (Deletion/duplication testing only), FH, GREM1 (promoter region duplication testing only), HOXB13, KIT, MBD4, MEN1, MLH1, MSH2, MSH3, MSH6, MUTYH, NF1, NHTL1, PALB2, PDGFRA, PMS2, POLD1, POLE, PTEN, RAD51C, RAD51D, SDHA (sequencing analysis only except exon 14), SDHB, SDHC, SDHD, SMAD4, SMARCA4. STK11, TP53, TSC1, TSC2, and VHL.  Based on Troy Dillon personal and family history of cancer, he meets medical criteria for genetic testing. Despite that he meets criteria, he may still have an out of pocket cost. We discussed that if his  out of pocket cost for testing is over $100, the laboratory will call and confirm whether he wants to proceed with testing.  If the out of pocket cost of testing is less than $100 he will be billed by the genetic testing laboratory.   PLAN: After considering the risks, benefits, and limitations, Mr. Moreland provided informed consent to pursue genetic testing and the blood sample was sent to [facility] for analysis of the Common Cancer Panel. Results should be available within approximately 2-3 weeks' time, at which point they will be disclosed by telephone to Mr. Esterline, as will any additional recommendations warranted by these results. Mr. Heidenreich will receive a summary of his genetic counseling visit and a copy of his results once available. This information will also be available in Epic.   Mr. Mcaffee questions were answered to his satisfaction today. Our contact information was provided should additional questions or concerns arise. Thank you for the referral and allowing [place] to share in the care of your patient.   Lucille Passy, MS,  [facility] Genetic Counselor [email or web address] (P) [phone]  The patient was seen for a total of 30 minutes in face-to-face genetic counseling. The patient brought his daughter. Drs. Lindi Adie and/or Burr Medico were available to discuss this case as needed.   _______________________________________________________________________ For Office Staff:  Number of people involved in session: 2 Was an Intern/ student involved with case: no

## 2022-08-21 ENCOUNTER — Encounter: Payer: Self-pay | Admitting: Hematology

## 2022-08-21 DIAGNOSIS — H401131 Primary open-angle glaucoma, bilateral, mild stage: Secondary | ICD-10-CM | POA: Diagnosis not present

## 2022-08-25 ENCOUNTER — Inpatient Hospital Stay: Payer: 59 | Attending: Hematology

## 2022-08-25 ENCOUNTER — Encounter: Payer: Self-pay | Admitting: Hematology

## 2022-08-25 ENCOUNTER — Inpatient Hospital Stay: Payer: 59

## 2022-08-25 ENCOUNTER — Inpatient Hospital Stay (HOSPITAL_BASED_OUTPATIENT_CLINIC_OR_DEPARTMENT_OTHER): Payer: 59 | Admitting: Hematology

## 2022-08-25 VITALS — BP 116/78 | HR 66 | Temp 97.6°F | Resp 16 | Ht 70.0 in | Wt 174.6 lb

## 2022-08-25 DIAGNOSIS — C20 Malignant neoplasm of rectum: Secondary | ICD-10-CM

## 2022-08-25 DIAGNOSIS — Z5111 Encounter for antineoplastic chemotherapy: Secondary | ICD-10-CM | POA: Diagnosis not present

## 2022-08-25 DIAGNOSIS — Z5189 Encounter for other specified aftercare: Secondary | ICD-10-CM | POA: Insufficient documentation

## 2022-08-25 DIAGNOSIS — Z95828 Presence of other vascular implants and grafts: Secondary | ICD-10-CM

## 2022-08-25 DIAGNOSIS — Z79899 Other long term (current) drug therapy: Secondary | ICD-10-CM | POA: Diagnosis not present

## 2022-08-25 LAB — CBC WITH DIFFERENTIAL/PLATELET
Basophils Relative: 2 %
Eosinophils Relative: 3 %
HCT: 41.2 % (ref 39.0–52.0)
Hemoglobin: 14.3 g/dL (ref 13.0–17.0)
Lymphocytes Relative: 31 %
MCH: 31.8 pg (ref 26.0–34.0)
MCHC: 34.7 g/dL (ref 30.0–36.0)
MCV: 91.6 fL (ref 80.0–100.0)
Monocytes Relative: 11 %
Neutrophils Relative %: 53 %
Platelets: 112 10*3/uL — ABNORMAL LOW (ref 150–400)
RBC: 4.5 MIL/uL (ref 4.22–5.81)
RDW: 16.1 % — ABNORMAL HIGH (ref 11.5–15.5)
WBC: 5.2 10*3/uL (ref 4.0–10.5)
nRBC: 0 % (ref 0.0–0.2)

## 2022-08-25 LAB — COMPREHENSIVE METABOLIC PANEL
ALT: 58 U/L — ABNORMAL HIGH (ref 0–44)
AST: 46 U/L — ABNORMAL HIGH (ref 15–41)
Albumin: 3.6 g/dL (ref 3.5–5.0)
Alkaline Phosphatase: 171 U/L — ABNORMAL HIGH (ref 38–126)
Anion gap: 7 (ref 5–15)
BUN: 11 mg/dL (ref 6–20)
CO2: 25 mmol/L (ref 22–32)
Calcium: 8.7 mg/dL — ABNORMAL LOW (ref 8.9–10.3)
Chloride: 104 mmol/L (ref 98–111)
Creatinine, Ser: 1.02 mg/dL (ref 0.61–1.24)
GFR, Estimated: >60 mL/min (ref >60)
Glucose, Bld: 104 mg/dL — ABNORMAL HIGH (ref 70–99)
Potassium: 4 mmol/L (ref 3.5–5.1)
Sodium: 136 mmol/L (ref 135–145)
Total Bilirubin: 1.2 mg/dL (ref 0.3–1.2)
Total Protein: 6.5 g/dL (ref 6.5–8.1)

## 2022-08-25 LAB — MAGNESIUM: Magnesium: 2 mg/dL (ref 1.7–2.4)

## 2022-08-25 MED ORDER — FLUOROURACIL CHEMO INJECTION 2.5 GM/50ML
400.0000 mg/m2 | Freq: Once | INTRAVENOUS | Status: AC
  Administered 2022-08-25: 800 mg via INTRAVENOUS
  Filled 2022-08-25: qty 16

## 2022-08-25 MED ORDER — PALONOSETRON HCL INJECTION 0.25 MG/5ML
0.2500 mg | Freq: Once | INTRAVENOUS | Status: AC
  Administered 2022-08-25: 0.25 mg via INTRAVENOUS
  Filled 2022-08-25: qty 5

## 2022-08-25 MED ORDER — SODIUM CHLORIDE 0.9 % IV SOLN
2500.0000 mg/m2 | INTRAVENOUS | Status: DC
  Administered 2022-08-25: 5000 mg via INTRAVENOUS
  Filled 2022-08-25: qty 100

## 2022-08-25 MED ORDER — DEXTROSE 5 % IV SOLN: Freq: Once | INTRAVENOUS | Status: AC

## 2022-08-25 MED ORDER — OXALIPLATIN CHEMO INJECTION 100 MG/20ML
68.0000 mg/m2 | Freq: Once | INTRAVENOUS | Status: AC
  Administered 2022-08-25: 135 mg via INTRAVENOUS
  Filled 2022-08-25: qty 10

## 2022-08-25 MED ORDER — SODIUM CHLORIDE 0.9% FLUSH
10.0000 mL | Freq: Once | INTRAVENOUS | Status: AC
  Administered 2022-08-25: 10 mL via INTRAVENOUS

## 2022-08-25 MED ORDER — LEUCOVORIN CALCIUM INJECTION 350 MG
400.0000 mg/m2 | Freq: Once | INTRAVENOUS | Status: AC
  Administered 2022-08-25: 800 mg via INTRAVENOUS
  Filled 2022-08-25: qty 40

## 2022-08-25 MED ORDER — SODIUM CHLORIDE 0.9 % IV SOLN
10.0000 mg | Freq: Once | INTRAVENOUS | Status: AC
  Administered 2022-08-25: 10 mg via INTRAVENOUS
  Filled 2022-08-25: qty 1

## 2022-08-25 MED ORDER — SODIUM CHLORIDE 0.9 % IV SOLN
150.0000 mg | Freq: Once | INTRAVENOUS | Status: AC
  Administered 2022-08-25: 150 mg via INTRAVENOUS
  Filled 2022-08-25: qty 150

## 2022-08-25 NOTE — Progress Notes (Signed)
Pt presents today for Folfox per provider's order. Vital signs and labs WNL for treatment. Oxaliplatin will be dose reduced by 20% due to neuropathy per Dr.K Okay to proceed for treatment per Dr.K.  Folfox given today per MD orders. Tolerated infusion without adverse affects. Vital signs stable. No complaints at this time. Discharged from clinic ambulatory in stable condition. Alert and oriented x 3. F/U with [facility] as scheduled. 5FU ambulatory pump infusing.

## 2022-08-25 NOTE — Progress Notes (Signed)
[place], [place] [address]   CLINIC:  Medical Oncology/Hematology  PCP:  Practice, [facility] [place] [address] [phone]   REASON FOR VISIT:  Follow-up for stage III rectal cancer  PRIOR THERAPY: None  NGS Results: MSI-stable, MMR-preserved  CURRENT THERAPY: FOLFOX  BRIEF ONCOLOGIC HISTORY:  Oncology History  Rectal carcinoma ([facility])  06/17/2022 Initial Diagnosis   Rectal carcinoma ([facility])   06/29/2022 -  Chemotherapy   Patient is on Treatment Plan : COLORECTAL FOLFOX q14d x 4 months       CANCER STAGING:  Cancer Staging  Rectal carcinoma ([facility]) Staging form: Colon and Rectum, AJCC 8th Edition - Clinical stage from 06/18/2022: Stage IIIB (cT3, cN1, cM0) - Unsigned    INTERVAL HISTORY:  [name] 52 y.o. male seen for follow-up of rectal cancer and toxicity assessment prior to cycle 5 of chemotherapy.  Energy levels are 100%.  In the last 2 weeks he developed right foot numbness in the toes on and off, present most of the time.  He had cold sensitivity lasted for first week.  He also felt like his extremities felt swollen for 1 day after last treatment.   REVIEW OF SYSTEMS:  Review of Systems  Neurological:  Positive for numbness (Right foot).  All other systems reviewed and are negative.    PAST MEDICAL/SURGICAL HISTORY:  Past Medical History:  Diagnosis Date   Rectal bleeding    Rectal cancer ([facility]) 06/08/2022   Past Surgical History:  Procedure Laterality Date   COLONOSCOPY  06/08/2022   EYE SURGERY     FRACTURE SURGERY     Right Femur- Cables in bone remain in place, rod removed in 2000   IR IMAGING GUIDED PORT INSERTION  06/22/2022   ORTHOPEDIC SURGERY       SOCIAL HISTORY:  Social History   Socioeconomic History   Marital status: Married    Spouse name: Not on file   Number of children: Not on file   Years of education: Not on file   Highest education level: Not on file   Occupational History   Not on file  Tobacco Use   Smoking status: Former    Types: Cigarettes    Quit date: 2003    Years since quitting: 21.0   Smokeless tobacco: Current    Types: Snuff   Tobacco comments:    Some Dipping  Vaping Use   Vaping Use: Never used  Substance and Sexual Activity   Alcohol use: Yes   Drug use: Never   Sexual activity: Not on file  Other Topics Concern   Not on file  Social History Narrative   Not on file   Social Determinants of Health   Financial Resource Strain: Not on file  Food Insecurity: Not on file  Transportation Needs: Not on file  Physical Activity: Not on file  Stress: Not on file  Social Connections: Not on file  Intimate Partner Violence: Not on file    FAMILY HISTORY:  Family History  Problem Relation Age of Onset   Cancer Mother 59 - 76       GYN malignancy details unknown, TAH/BSO   Colon cancer Father 12   Heart attack Father    Colon cancer Paternal Aunt 58   Colon cancer Paternal Grandmother 10    CURRENT MEDICATIONS:  Outpatient Encounter Medications as of 08/25/2022  Medication Sig   Brinzolamide-Brimonidine 1-0.2 % SUSP    dextrose 5 %  SOLN 1,000 mL with fluorouracil 5 GM/100ML SOLN Inject into the vein over 48 hr. Every 14 days   FLUOROURACIL IV Inject into the vein every 14 (fourteen) days.   ibuprofen (ADVIL) 200 MG tablet Take 200 mg by mouth every 6 (six) hours as needed for moderate pain.   LEUCOVORIN CALCIUM IV Inject into the vein every 14 (fourteen) days.   Multiple Vitamin (MULTIVITAMIN) tablet Take 1 tablet by mouth daily.   OXALIPLATIN IV Inject into the vein every 14 (fourteen) days.   Salicylic Acid, Acne, (SALICYLIC ACID EX) Apply 1 Application topically daily as needed (acne).   sodium chloride (OCEAN) 0.65 % SOLN nasal spray Place 1 spray into both nostrils as needed for congestion.   lidocaine-prilocaine (EMLA) cream Apply a small amount to port a cath site and cover with plastic wrap one hour  prior to infusion appointments   ondansetron (ZOFRAN) 8 MG tablet Take 1 tablet (8 mg total) by mouth every 8 (eight) hours as needed.   prochlorperazine (COMPAZINE) 10 MG tablet Take 1 tablet (10 mg total) by mouth every 6 (six) hours as needed for nausea or vomiting.   promethazine (PHENERGAN) 25 MG suppository Place 1 suppository (25 mg total) rectally every 6 (six) hours as needed for nausea or vomiting.   Facility-Administered Encounter Medications as of 08/25/2022  Medication   [COMPLETED] sodium chloride flush (NS) 0.9 % injection 10 mL    ALLERGIES:  Allergies  Allergen Reactions   Hydrocodone Itching   Latex     Eye irritation      PHYSICAL EXAM:  ECOG Performance status: 0  There were no vitals filed for this visit. There were no vitals filed for this visit. Physical Exam Vitals reviewed.  Constitutional:      Appearance: Normal appearance.  Cardiovascular:     Rate and Rhythm: Normal rate and regular rhythm.     Pulses: Normal pulses.     Heart sounds: Normal heart sounds.  Pulmonary:     Effort: Pulmonary effort is normal.     Breath sounds: Normal breath sounds.  Neurological:     Mental Status: He is alert.  Psychiatric:        Mood and Affect: Mood normal.        Behavior: Behavior normal.      LABORATORY DATA:  I have reviewed the labs as listed.  CBC    Component Value Date/Time   WBC 5.2 08/25/2022 0912   RBC 4.50 08/25/2022 0912   HGB 14.3 08/25/2022 0912   HCT 41.2 08/25/2022 0912   PLT 112 (L) 08/25/2022 0912   MCV 91.6 08/25/2022 0912   MCH 31.8 08/25/2022 0912   MCHC 34.7 08/25/2022 0912   RDW 16.1 (H) 08/25/2022 0912   LYMPHSABS 1.2 08/10/2022 0843   MONOABS 0.6 08/10/2022 0843   EOSABS 0.1 08/10/2022 0843   BASOSABS 0.1 08/10/2022 0843      Latest Ref Rng & Units 08/25/2022    9:12 AM 08/10/2022    8:43 AM 07/27/2022    8:34 AM  CMP  Glucose 70 - 99 mg/dL 104  105  121   BUN 6 - 20 mg/dL _0 Creatinine 0.61 - 1.24  mg/dL 1.02  0.99  0.98   Sodium 135 - 145 mmol/L 136  140  139   Potassium 3.5 - 5.1 mmol/L 4.0  4.1  3.9   Chloride 98 - 111 mmol/L 104  109  108   CO2  22 - 32 mmol/L _0 Calcium 8.9 - 10.3 mg/dL 8.7  8.6  8.3   Total Protein 6.5 - 8.1 g/dL 6.5  6.4  6.5   Total Bilirubin 0.3 - 1.2 mg/dL 1.2  0.6  0.5   Alkaline Phos 38 - 126 U/L 171  156  133   AST 15 - 41 U/L 46  34  35   ALT 0 - 44 U/L 58  39  36     DIAGNOSTIC IMAGING:  I have independently reviewed the scans and discussed with the patient.  ASSESSMENT:  1.  Stage IIIB (T3cN1) rectal adenocarcinoma, MSI stable: - Presentation with rectal bleeding - Colonoscopy (06/08/2022): Fungating partially obstructing large mass found in the rectum 11 cm from anal verge.  Partially circumferential involving one half of the lumen.  Mass measured 3 cm in length.  Diameter 2 cm.  Oozing present.  1 semipedunculated polyp (40 cm from anus) and 3 sessile polyps in the cecum removed. - Pathology (06/08/2022): Rectal mass 11 cm from anal verge-adenocarcinoma.  MMR preserved.  MSI-stable. - CEA (06/08/2022): 2.9 - MRI pelvis (06/10/2022): T3CN1, tumor extends into sigmoid from the high rectum, extension through muscularis propria, approximately 6 mm beyond the rectal wall.  Tumor begins in the highly portion of the rectum and extends into the sigmoid colon.  Extramural vascular invasion/tumor thrombus along the right lateral margin.  Shortest distance of tumor from mesorectal fascia: 15 mm.  Single high mesorectal or superior rectal lymph node measuring 9 mm.  Distance from tumor to internal anal sphincter is approximately 7-11 cm. - CT CAP (06/10/2022): Circumferential wall thickening of the upper/mid rectum, mildly enlarged high perirectal lymph nodes measuring up to 6 mm in short axis.  No evidence of distant metastatic disease in the chest, abdomen or pelvis. - Neoadjuvant FOLFOX cycle 1 on 06/29/2022   2.  Social/family history: - He lives  at home with his wife Troy Dillon.  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  Denies any chemical exposure.  Does not smoke cigarettes but dips tobacco. - Father had colon cancer at age 74. - Paternal grandmother died of colon cancer. - Paternal aunt had colon cancer. - Mother had "male cancer" and underwent hysterectomy.    PLAN:  1.  Stage IIIb (T3CN1) rectal adenocarcinoma, MSI-stable: - Reviewed labs today.  AST and ALT are elevated at 46 and 58 respectively with total bilirubin normal.  CBC was grossly normal with mild thrombocytopenia of 112. - Proceed with cycle 5 today with 20% dose reduction of oxaliplatin.  RTC 3 weeks for follow-up.  2.  Nausea/vomiting: - Continue Zofran and Phenergan as needed.  3.  Peripheral neuropathy: - He has numbness in the right foot and toes, more often than not in the last 2 to 3 weeks. - Will decrease oxaliplatin by 20%.      Orders placed this encounter:  No orders of the defined types were placed in this encounter.     Derek Jack, MD [facility] [phone]

## 2022-08-25 NOTE — Progress Notes (Signed)
Patient has been examined by Dr. Delton Coombes, and vital signs and labs have been reviewed. ANC, Creatinine, LFTs, hemoglobin, and platelets are within treatment parameters per M.D. - pt may proceed with treatment.  Oxaliplatin dose reduced by 20% per MD. Primary RN and pharmacy notified.

## 2022-08-25 NOTE — Patient Instructions (Addendum)
[facility] Discharge Instructions   You were seen and examined today by Dr. Katragadda.  He reviewed the results of your lab work which are normal/stable.   We will proceed with your treatment today.  Return as scheduled.    Thank you for choosing  [facility] to provide your oncology and hematology care.  To afford each patient quality time with our provider, please arrive at least 15 minutes before your scheduled appointment time.   If you have a lab appointment with the Cancer Center please come in thru the Main Entrance and check in at the main information desk.  You need to re-schedule your appointment should you arrive 10 or more minutes late.  We strive to give you quality time with our providers, and arriving late affects you and other patients whose appointments are after yours.  Also, if you no show three or more times for appointments you may be dismissed from the clinic at the providers discretion.     Again, thank you for choosing [facility].  Our hope is that these requests will decrease the amount of time that you wait before being seen by our physicians.       _____________________________________________________________  Should you have questions after your visit to [facility], please contact our office at [phone] and follow the prompts.  Our office hours are 8:00 a.m. and 4:30 p.m. Monday - Friday.  Please note that voicemails left after 4:00 p.m. may not be returned until the following business day.  We are closed weekends and major holidays.  You do have access to a nurse 24-7, just call the main number to the clinic [phone] and do not press any options, hold on the line and a nurse will answer the phone.    For prescription refill requests, have your pharmacy contact our office and allow 72 hours.    Due to Covid, you will need to wear a mask upon entering  the hospital. If you do not have a mask, a mask will be given to you at the Main Entrance upon arrival. For doctor visits, patients may have 1 support person age 18 or older with them. For treatment visits, patients can not have anyone with them due to social distancing guidelines and our immunocompromised population.      

## 2022-08-25 NOTE — Patient Instructions (Signed)
[facility]  Discharge Instructions: Thank you for choosing [facility] to provide your oncology and hematology care.  If you have a lab appointment with the Cancer Center, please come in thru the Main Entrance and check in at the main information desk.  Wear comfortable clothing and clothing appropriate for easy access to any Portacath or PICC line.   We strive to give you quality time with your provider. You may need to reschedule your appointment if you arrive late (15 or more minutes).  Arriving late affects you and other patients whose appointments are after yours.  Also, if you miss three or more appointments without notifying the office, you may be dismissed from the clinic at the provider's discretion.      For prescription refill requests, have your pharmacy contact our office and allow 72 hours for refills to be completed.    Today you received the following chemotherapy and/or immunotherapy agents Folfox   To help prevent nausea and vomiting after your treatment, we encourage you to take your nausea medication as directed.   Oxaliplatin Injection What is this medication? OXALIPLATIN (ox AL i PLA tin) treats some types of cancer. It works by slowing down the growth of cancer cells. This medicine may be used for other purposes; ask your health care provider or pharmacist if you have questions. COMMON BRAND NAME(S): Eloxatin What should I tell my care team before I take this medication? They need to know if you have any of these conditions: Heart disease History of irregular heartbeat or rhythm Liver disease Low blood cell levels (white cells, red cells, and platelets) Lung or breathing disease, such as asthma Take medications that treat or prevent blood clots Tingling of the fingers, toes, or other nerve disorder An unusual or allergic reaction to oxaliplatin, other medications, foods, dyes, or preservatives If you or your partner are  pregnant or trying to get pregnant Breast-feeding How should I use this medication? This medication is injected into a vein. It is given by your care team in a hospital or clinic setting. Talk to your care team about the use of this medication in children. Special care may be needed. Overdosage: If you think you have taken too much of this medicine contact a poison control center or emergency room at once. NOTE: This medicine is only for you. Do not share this medicine with others. What if I miss a dose? Keep appointments for follow-up doses. It is important not to miss a dose. Call your care team if you are unable to keep an appointment. What may interact with this medication? Do not take this medication with any of the following: Cisapride Dronedarone Pimozide Thioridazine This medication may also interact with the following: Aspirin and aspirin-like medications Certain medications that treat or prevent blood clots, such as warfarin, apixaban, dabigatran, and rivaroxaban Cisplatin Cyclosporine Diuretics Medications for infection, such as acyclovir, adefovir, amphotericin B, bacitracin, cidofovir, foscarnet, ganciclovir, gentamicin, pentamidine, vancomycin NSAIDs, medications for pain and inflammation, such as ibuprofen or naproxen Other medications that cause heart rhythm changes Pamidronate Zoledronic acid This list may not describe all possible interactions. Give your health care provider a list of all the medicines, herbs, non-prescription drugs, or dietary supplements you use. Also tell them if you smoke, drink alcohol, or use illegal drugs. Some items may interact with your medicine. What should I watch for while using this medication? Your condition will be monitored carefully while you are receiving this medication. You may   need blood work while taking this medication. This medication may make you feel generally unwell. This is not uncommon as chemotherapy can affect healthy  cells as well as cancer cells. Report any side effects. Continue your course of treatment even though you feel ill unless your care team tells you to stop. This medication may increase your risk of getting an infection. Call your care team for advice if you get a fever, chills, sore throat, or other symptoms of a cold or flu. Do not treat yourself. Try to avoid being around people who are sick. Avoid taking medications that contain aspirin, acetaminophen, ibuprofen, naproxen, or ketoprofen unless instructed by your care team. These medications may hide a fever. Be careful brushing or flossing your teeth or using a toothpick because you may get an infection or bleed more easily. If you have any dental work done, tell your dentist you are receiving this medication. This medication can make you more sensitive to cold. Do not drink cold drinks or use ice. Cover exposed skin before coming in contact with cold temperatures or cold objects. When out in cold weather wear warm clothing and cover your mouth and nose to warm the air that goes into your lungs. Tell your care team if you get sensitive to the cold. Talk to your care team if you or your partner are pregnant or think either of you might be pregnant. This medication can cause serious birth defects if taken during pregnancy and for 9 months after the last dose. A negative pregnancy test is required before starting this medication. A reliable form of contraception is recommended while taking this medication and for 9 months after the last dose. Talk to your care team about effective forms of contraception. Do not father a child while taking this medication and for 6 months after the last dose. Use a condom while having sex during this time period. Do not breastfeed while taking this medication and for 3 months after the last dose. This medication may cause infertility. Talk to your care team if you are concerned about your fertility. What side effects may I  notice from receiving this medication? Side effects that you should report to your care team as soon as possible: Allergic reactions--skin rash, itching, hives, swelling of the face, lips, tongue, or throat Bleeding--bloody or black, tar-like stools, vomiting blood or brown material that looks like coffee grounds, red or dark brown urine, small red or purple spots on skin, unusual bruising or bleeding Dry cough, shortness of breath or trouble breathing Heart rhythm changes--fast or irregular heartbeat, dizziness, feeling faint or lightheaded, chest pain, trouble breathing Infection--fever, chills, cough, sore throat, wounds that don't heal, pain or trouble when passing urine, general feeling of discomfort or being unwell Liver injury--right upper belly pain, loss of appetite, nausea, light-colored stool, dark yellow or brown urine, yellowing skin or eyes, unusual weakness or fatigue Low red blood cell level--unusual weakness or fatigue, dizziness, headache, trouble breathing Muscle injury--unusual weakness or fatigue, muscle pain, dark yellow or brown urine, decrease in amount of urine Pain, tingling, or numbness in the hands or feet Sudden and severe headache, confusion, change in vision, seizures, which may be signs of posterior reversible encephalopathy syndrome (PRES) Unusual bruising or bleeding Side effects that usually do not require medical attention (report to your care team if they continue or are bothersome): Diarrhea Nausea Pain, redness, or swelling with sores inside the mouth or throat Unusual weakness or fatigue Vomiting This list may not describe   all possible side effects. Call your doctor for medical advice about side effects. You may report side effects to FDA at 1-800-FDA-1088. Where should I keep my medication? This medication is given in a hospital or clinic. It will not be stored at home. NOTE: This sheet is a summary. It may not cover all possible information. If you have  questions about this medicine, talk to your doctor, pharmacist, or health care provider.  2023 Elsevier/Gold Standard (2007-10-01 00:00:00)  Leucovorin Injection What is this medication? LEUCOVORIN (loo koe VOR in) prevents side effects from certain medications, such as methotrexate. It works by increasing folate levels. This helps protect healthy cells in your body. It may also be used to treat anemia caused by low levels of folate. It can also be used with fluorouracil, a type of chemotherapy, to treat colorectal cancer. It works by increasing the effects of fluorouracil in the body. This medicine may be used for other purposes; ask your health care provider or pharmacist if you have questions. What should I tell my care team before I take this medication? They need to know if you have any of these conditions: Anemia from low levels of vitamin B12 in the blood An unusual or allergic reaction to leucovorin, folic acid, other medications, foods, dyes, or preservatives Pregnant or trying to get pregnant Breastfeeding How should I use this medication? This medication is injected into a vein or a muscle. It is given by your care team in a hospital or clinic setting. Talk to your care team about the use of this medication in children. Special care may be needed. Overdosage: If you think you have taken too much of this medicine contact a poison control center or emergency room at once. NOTE: This medicine is only for you. Do not share this medicine with others. What if I miss a dose? Keep appointments for follow-up doses. It is important not to miss your dose. Call your care team if you are unable to keep an appointment. What may interact with this medication? Capecitabine Fluorouracil Phenobarbital Phenytoin Primidone Trimethoprim;sulfamethoxazole This list may not describe all possible interactions. Give your health care provider a list of all the medicines, herbs, non-prescription drugs, or  dietary supplements you use. Also tell them if you smoke, drink alcohol, or use illegal drugs. Some items may interact with your medicine. What should I watch for while using this medication? Your condition will be monitored carefully while you are receiving this medication. This medication may increase the side effects of 5-fluorouracil. Tell your care team if you have diarrhea or mouth sores that do not get better or that get worse. What side effects may I notice from receiving this medication? Side effects that you should report to your care team as soon as possible: Allergic reactions--skin rash, itching, hives, swelling of the face, lips, tongue, or throat This list may not describe all possible side effects. Call your doctor for medical advice about side effects. You may report side effects to FDA at 1-800-FDA-1088. Where should I keep my medication? This medication is given in a hospital or clinic. It will not be stored at home. NOTE: This sheet is a summary. It may not cover all possible information. If you have questions about this medicine, talk to your doctor, pharmacist, or health care provider.  2023 Elsevier/Gold Standard (2022-01-13 00:00:00)  Fluorouracil Injection What is this medication? FLUOROURACIL (flure oh YOOR a sil) treats some types of cancer. It works by slowing down the growth of cancer   cells. This medicine may be used for other purposes; ask your health care provider or pharmacist if you have questions. COMMON BRAND NAME(S): Adrucil What should I tell my care team before I take this medication? They need to know if you have any of these conditions: Blood disorders Dihydropyrimidine dehydrogenase (DPD) deficiency Infection, such as chickenpox, cold sores, herpes Kidney disease Liver disease Poor nutrition Recent or ongoing radiation therapy An unusual or allergic reaction to fluorouracil, other medications, foods, dyes, or preservatives If you or your partner  are pregnant or trying to get pregnant Breast-feeding How should I use this medication? This medication is injected into a vein. It is administered by your care team in a hospital or clinic setting. Talk to your care team about the use of this medication in children. Special care may be needed. Overdosage: If you think you have taken too much of this medicine contact a poison control center or emergency room at once. NOTE: This medicine is only for you. Do not share this medicine with others. What if I miss a dose? Keep appointments for follow-up doses. It is important not to miss your dose. Call your care team if you are unable to keep an appointment. What may interact with this medication? Do not take this medication with any of the following: Live virus vaccines This medication may also interact with the following: Medications that treat or prevent blood clots, such as warfarin, enoxaparin, dalteparin This list may not describe all possible interactions. Give your health care provider a list of all the medicines, herbs, non-prescription drugs, or dietary supplements you use. Also tell them if you smoke, drink alcohol, or use illegal drugs. Some items may interact with your medicine. What should I watch for while using this medication? Your condition will be monitored carefully while you are receiving this medication. This medication may make you feel generally unwell. This is not uncommon as chemotherapy can affect healthy cells as well as cancer cells. Report any side effects. Continue your course of treatment even though you feel ill unless your care team tells you to stop. In some cases, you may be given additional medications to help with side effects. Follow all directions for their use. This medication may increase your risk of getting an infection. Call your care team for advice if you get a fever, chills, sore throat, or other symptoms of a cold or flu. Do not treat yourself. Try to  avoid being around people who are sick. This medication may increase your risk to bruise or bleed. Call your care team if you notice any unusual bleeding. Be careful brushing or flossing your teeth or using a toothpick because you may get an infection or bleed more easily. If you have any dental work done, tell your dentist you are receiving this medication. Avoid taking medications that contain aspirin, acetaminophen, ibuprofen, naproxen, or ketoprofen unless instructed by your care team. These medications may hide a fever. Do not treat diarrhea with over the counter products. Contact your care team if you have diarrhea that lasts more than 2 days or if it is severe and watery. This medication can make you more sensitive to the sun. Keep out of the sun. If you cannot avoid being in the sun, wear protective clothing and sunscreen. Do not use sun lamps, tanning beds, or tanning booths. Talk to your care team if you or your partner wish to become pregnant or think you might be pregnant. This medication can cause serious birth defects   if taken during pregnancy and for 3 months after the last dose. A reliable form of contraception is recommended while taking this medication and for 3 months after the last dose. Talk to your care team about effective forms of contraception. Do not father a child while taking this medication and for 3 months after the last dose. Use a condom while having sex during this time period. Do not breastfeed while taking this medication. This medication may cause infertility. Talk to your care team if you are concerned about your fertility. What side effects may I notice from receiving this medication? Side effects that you should report to your care team as soon as possible: Allergic reactions--skin rash, itching, hives, swelling of the face, lips, tongue, or throat Heart attack--pain or tightness in the chest, shoulders, arms, or jaw, nausea, shortness of breath, cold or clammy  skin, feeling faint or lightheaded Heart failure--shortness of breath, swelling of the ankles, feet, or hands, sudden weight gain, unusual weakness or fatigue Heart rhythm changes--fast or irregular heartbeat, dizziness, feeling faint or lightheaded, chest pain, trouble breathing High ammonia level--unusual weakness or fatigue, confusion, loss of appetite, nausea, vomiting, seizures Infection--fever, chills, cough, sore throat, wounds that don't heal, pain or trouble when passing urine, general feeling of discomfort or being unwell Low red blood cell level--unusual weakness or fatigue, dizziness, headache, trouble breathing Pain, tingling, or numbness in the hands or feet, muscle weakness, change in vision, confusion or trouble speaking, loss of balance or coordination, trouble walking, seizures Redness, swelling, and blistering of the skin over hands and feet Severe or prolonged diarrhea Unusual bruising or bleeding Side effects that usually do not require medical attention (report to your care team if they continue or are bothersome): Dry skin Headache Increased tears Nausea Pain, redness, or swelling with sores inside the mouth or throat Sensitivity to light Vomiting This list may not describe all possible side effects. Call your doctor for medical advice about side effects. You may report side effects to FDA at 1-800-FDA-1088. Where should I keep my medication? This medication is given in a hospital or clinic. It will not be stored at home. NOTE: This sheet is a summary. It may not cover all possible information. If you have questions about this medicine, talk to your doctor, pharmacist, or health care provider.  2023 Elsevier/Gold Standard (2021-12-09 00:00:00)    BELOW ARE SYMPTOMS THAT SHOULD BE REPORTED IMMEDIATELY: *FEVER GREATER THAN 100.4 F (38 C) OR HIGHER *CHILLS OR SWEATING *NAUSEA AND VOMITING THAT IS NOT CONTROLLED WITH YOUR NAUSEA MEDICATION *UNUSUAL SHORTNESS OF  BREATH *UNUSUAL BRUISING OR BLEEDING *URINARY PROBLEMS (pain or burning when urinating, or frequent urination) *BOWEL PROBLEMS (unusual diarrhea, constipation, pain near the anus) TENDERNESS IN MOUTH AND THROAT WITH OR WITHOUT PRESENCE OF ULCERS (sore throat, sores in mouth, or a toothache) UNUSUAL RASH, SWELLING OR PAIN  UNUSUAL VAGINAL DISCHARGE OR ITCHING   Items with * indicate a potential emergency and should be followed up as soon as possible or go to the Emergency Department if any problems should occur.  Please show the CHEMOTHERAPY ALERT CARD or IMMUNOTHERAPY ALERT CARD at check-in to the Emergency Department and triage nurse.  Should you have questions after your visit or need to cancel or reschedule your appointment, please contact [facility] [phone]  and follow the prompts.  Office hours are 8:00 a.m. to 4:30 p.m. Monday - Friday. Please note that voicemails left after 4:00 p.m. may not be returned until the following   business day.  We are closed weekends and major holidays. You have access to a nurse at all times for urgent questions. Please call the main number to the clinic [phone] and follow the prompts.  For any non-urgent questions, you may also contact your provider using MyChart. We now offer e-Visits for anyone 18 and older to request care online for non-urgent symptoms. For details visit [email or web address].   Also download the MyChart app! Go to the app store, search "MyChart", open the app, select [facility], and log in with your MyChart username and password.   

## 2022-08-27 ENCOUNTER — Inpatient Hospital Stay: Payer: 59

## 2022-08-27 VITALS — BP 125/73 | HR 80 | Temp 97.6°F | Resp 18

## 2022-08-27 DIAGNOSIS — Z5111 Encounter for antineoplastic chemotherapy: Secondary | ICD-10-CM | POA: Diagnosis not present

## 2022-08-27 DIAGNOSIS — C20 Malignant neoplasm of rectum: Secondary | ICD-10-CM

## 2022-08-27 DIAGNOSIS — Z79899 Other long term (current) drug therapy: Secondary | ICD-10-CM | POA: Diagnosis not present

## 2022-08-27 DIAGNOSIS — Z5189 Encounter for other specified aftercare: Secondary | ICD-10-CM | POA: Diagnosis not present

## 2022-08-27 DIAGNOSIS — Z95828 Presence of other vascular implants and grafts: Secondary | ICD-10-CM

## 2022-08-27 MED ORDER — SODIUM CHLORIDE 0.9% FLUSH
10.0000 mL | INTRAVENOUS | Status: DC | PRN
  Administered 2022-08-27: 10 mL

## 2022-08-27 MED ORDER — HEPARIN SOD (PORK) LOCK FLUSH 100 UNIT/ML IV SOLN
500.0000 [IU] | Freq: Once | INTRAVENOUS | Status: AC | PRN
  Administered 2022-08-27: 500 [IU]

## 2022-08-27 MED ORDER — PEGFILGRASTIM-JMDB 6 MG/0.6ML ~~LOC~~ SOSY
6.0000 mg | PREFILLED_SYRINGE | Freq: Once | SUBCUTANEOUS | Status: AC
  Administered 2022-08-27: 6 mg via SUBCUTANEOUS
  Filled 2022-08-27: qty 0.6

## 2022-08-27 NOTE — Progress Notes (Signed)
Patient presents today for pump d/c. Vital signs are stable. Port a cath site clean, dry, and intact. Port flushed with 10 mls of Normal Saline and 500 Units of Heparin. Needle removed intact. Band aid applied. Patient has no complaints at this time. Discharged from clinic ambulatory and in stable condition. Patient alert and oriented.  

## 2022-08-27 NOTE — Patient Instructions (Signed)
[facility]  Discharge Instructions: Thank you for choosing [facility] to provide your oncology and hematology care.  If you have a lab appointment with the Cancer Center, please come in thru the Main Entrance and check in at the main information desk.  Wear comfortable clothing and clothing appropriate for easy access to any Portacath or PICC line.   We strive to give you quality time with your provider. You may need to reschedule your appointment if you arrive late (15 or more minutes).  Arriving late affects you and other patients whose appointments are after yours.  Also, if you miss three or more appointments without notifying the office, you may be dismissed from the clinic at the provider's discretion.      For prescription refill requests, have your pharmacy contact our office and allow 72 hours for refills to be completed.    Today you received the following chemotherapy and/or immunotherapy agents Adrucil/5FU. Fluorouracil Injection What is this medication? FLUOROURACIL (flure oh YOOR a sil) treats some types of cancer. It works by slowing down the growth of cancer cells. This medicine may be used for other purposes; ask your health care provider or pharmacist if you have questions. COMMON BRAND NAME(S): Adrucil What should I tell my care team before I take this medication? They need to know if you have any of these conditions: Blood disorders Dihydropyrimidine dehydrogenase (DPD) deficiency Infection, such as chickenpox, cold sores, herpes Kidney disease Liver disease Poor nutrition Recent or ongoing radiation therapy An unusual or allergic reaction to fluorouracil, other medications, foods, dyes, or preservatives If you or your partner are pregnant or trying to get pregnant Breast-feeding How should I use this medication? This medication is injected into a vein. It is administered by your care team in a hospital or clinic setting. Talk to  your care team about the use of this medication in children. Special care may be needed. Overdosage: If you think you have taken too much of this medicine contact a poison control center or emergency room at once. NOTE: This medicine is only for you. Do not share this medicine with others. What if I miss a dose? Keep appointments for follow-up doses. It is important not to miss your dose. Call your care team if you are unable to keep an appointment. What may interact with this medication? Do not take this medication with any of the following: Live virus vaccines This medication may also interact with the following: Medications that treat or prevent blood clots, such as warfarin, enoxaparin, dalteparin This list may not describe all possible interactions. Give your health care provider a list of all the medicines, herbs, non-prescription drugs, or dietary supplements you use. Also tell them if you smoke, drink alcohol, or use illegal drugs. Some items may interact with your medicine. What should I watch for while using this medication? Your condition will be monitored carefully while you are receiving this medication. This medication may make you feel generally unwell. This is not uncommon as chemotherapy can affect healthy cells as well as cancer cells. Report any side effects. Continue your course of treatment even though you feel ill unless your care team tells you to stop. In some cases, you may be given additional medications to help with side effects. Follow all directions for their use. This medication may increase your risk of getting an infection. Call your care team for advice if you get a fever, chills, sore throat, or other symptoms of   a cold or flu. Do not treat yourself. Try to avoid being around people who are sick. This medication may increase your risk to bruise or bleed. Call your care team if you notice any unusual bleeding. Be careful brushing or flossing your teeth or using a  toothpick because you may get an infection or bleed more easily. If you have any dental work done, tell your dentist you are receiving this medication. Avoid taking medications that contain aspirin, acetaminophen, ibuprofen, naproxen, or ketoprofen unless instructed by your care team. These medications may hide a fever. Do not treat diarrhea with over the counter products. Contact your care team if you have diarrhea that lasts more than 2 days or if it is severe and watery. This medication can make you more sensitive to the sun. Keep out of the sun. If you cannot avoid being in the sun, wear protective clothing and sunscreen. Do not use sun lamps, tanning beds, or tanning booths. Talk to your care team if you or your partner wish to become pregnant or think you might be pregnant. This medication can cause serious birth defects if taken during pregnancy and for 3 months after the last dose. A reliable form of contraception is recommended while taking this medication and for 3 months after the last dose. Talk to your care team about effective forms of contraception. Do not father a child while taking this medication and for 3 months after the last dose. Use a condom while having sex during this time period. Do not breastfeed while taking this medication. This medication may cause infertility. Talk to your care team if you are concerned about your fertility. What side effects may I notice from receiving this medication? Side effects that you should report to your care team as soon as possible: Allergic reactions--skin rash, itching, hives, swelling of the face, lips, tongue, or throat Heart attack--pain or tightness in the chest, shoulders, arms, or jaw, nausea, shortness of breath, cold or clammy skin, feeling faint or lightheaded Heart failure--shortness of breath, swelling of the ankles, feet, or hands, sudden weight gain, unusual weakness or fatigue Heart rhythm changes--fast or irregular heartbeat,  dizziness, feeling faint or lightheaded, chest pain, trouble breathing High ammonia level--unusual weakness or fatigue, confusion, loss of appetite, nausea, vomiting, seizures Infection--fever, chills, cough, sore throat, wounds that don't heal, pain or trouble when passing urine, general feeling of discomfort or being unwell Low red blood cell level--unusual weakness or fatigue, dizziness, headache, trouble breathing Pain, tingling, or numbness in the hands or feet, muscle weakness, change in vision, confusion or trouble speaking, loss of balance or coordination, trouble walking, seizures Redness, swelling, and blistering of the skin over hands and feet Severe or prolonged diarrhea Unusual bruising or bleeding Side effects that usually do not require medical attention (report to your care team if they continue or are bothersome): Dry skin Headache Increased tears Nausea Pain, redness, or swelling with sores inside the mouth or throat Sensitivity to light Vomiting This list may not describe all possible side effects. Call your doctor for medical advice about side effects. You may report side effects to FDA at 1-800-FDA-1088. Where should I keep my medication? This medication is given in a hospital or clinic. It will not be stored at home. NOTE: This sheet is a summary. It may not cover all possible information. If you have questions about this medicine, talk to your doctor, pharmacist, or health care provider.  2023 Elsevier/Gold Standard (2021-12-09 00:00:00)         To help prevent nausea and vomiting after your treatment, we encourage you to take your nausea medication as directed.  BELOW ARE SYMPTOMS THAT SHOULD BE REPORTED IMMEDIATELY: *FEVER GREATER THAN 100.4 F (38 C) OR HIGHER *CHILLS OR SWEATING *NAUSEA AND VOMITING THAT IS NOT CONTROLLED WITH YOUR NAUSEA MEDICATION *UNUSUAL SHORTNESS OF BREATH *UNUSUAL BRUISING OR BLEEDING *URINARY PROBLEMS (pain or burning when urinating, or  frequent urination) *BOWEL PROBLEMS (unusual diarrhea, constipation, pain near the anus) TENDERNESS IN MOUTH AND THROAT WITH OR WITHOUT PRESENCE OF ULCERS (sore throat, sores in mouth, or a toothache) UNUSUAL RASH, SWELLING OR PAIN  UNUSUAL VAGINAL DISCHARGE OR ITCHING   Items with * indicate a potential emergency and should be followed up as soon as possible or go to the Emergency Department if any problems should occur.  Please show the CHEMOTHERAPY ALERT CARD or IMMUNOTHERAPY ALERT CARD at check-in to the Emergency Department and triage nurse.  Should you have questions after your visit or need to cancel or reschedule your appointment, please contact [facility] [phone]  and follow the prompts.  Office hours are 8:00 a.m. to 4:30 p.m. Monday - Friday. Please note that voicemails left after 4:00 p.m. may not be returned until the following business day.  We are closed weekends and major holidays. You have access to a nurse at all times for urgent questions. Please call the main number to the clinic [phone] and follow the prompts.  For any non-urgent questions, you may also contact your provider using MyChart. We now offer e-Visits for anyone 18 and older to request care online for non-urgent symptoms. For details visit [email or web address].   Also download the MyChart app! Go to the app store, search "MyChart", open the app, select [facility], and log in with your MyChart username and password.   

## 2022-08-29 DIAGNOSIS — C20 Malignant neoplasm of rectum: Secondary | ICD-10-CM | POA: Diagnosis not present

## 2022-09-02 ENCOUNTER — Other Ambulatory Visit: Payer: Self-pay

## 2022-09-02 ENCOUNTER — Ambulatory Visit: Payer: No Typology Code available for payment source | Attending: Radiation Oncology

## 2022-09-02 DIAGNOSIS — R279 Unspecified lack of coordination: Secondary | ICD-10-CM | POA: Insufficient documentation

## 2022-09-02 DIAGNOSIS — M62838 Other muscle spasm: Secondary | ICD-10-CM | POA: Diagnosis not present

## 2022-09-02 NOTE — Therapy (Signed)
OUTPATIENT PHYSICAL THERAPY MALE PELVIC EVALUATION   Patient Name: Troy Dillon MRN: [id number] DOB:[birth year], 52 y.o., male Today's Date: 09/02/2022  END OF SESSION:  PT End of Session - 09/02/22 1654     Visit Number 1    Date for PT Re-Evaluation 02/17/23    Authorization Type [employer]    PT Start Time 1530    PT Stop Time 1615    PT Time Calculation (min) 45 min    Activity Tolerance Patient tolerated treatment well    Behavior During Therapy [facility] for tasks assessed/performed             Past Medical History:  Diagnosis Date   Rectal bleeding    Rectal cancer ([place]) 06/08/2022   Past Surgical History:  Procedure Laterality Date   COLONOSCOPY  06/08/2022   EYE SURGERY     FRACTURE SURGERY     Right Femur- Cables in bone remain in place, rod removed in 2000   IR IMAGING GUIDED PORT INSERTION  06/22/2022   ORTHOPEDIC SURGERY     Patient Active Problem List   Diagnosis Date Noted   Family history of colon cancer 08/20/2022   Port-A-Cath in place 06/25/2022   Rectal carcinoma ([place]) 06/17/2022    PCP: [facility]  REFERRING PROVIDER: Derek Jack, MD  REFERRING DIAG: C20 (ICD-10-CM) - Rectal carcinoma ([place])  THERAPY DIAG:  Unspecified lack of coordination  Other muscle spasm  Rationale for Evaluation and Treatment: Rehabilitation  ONSET DATE: August 2023  SUBJECTIVE:                                                                                                                                                                                           09/02/22 SUBJECTIVE STATEMENT: Pt has 3 more chemo treatments and then will start radiation treatment. Pt is being sent to physical therapy in order to strengthen abdominal muscles in order to help optimize use once he has colostomy bag.  Fluid intake: Yes: no water (taste of water is awful since starting chemo); he drinks coke and orange juice    PAIN:  Are you having pain?  No   PRECAUTIONS: None  WEIGHT BEARING RESTRICTIONS: No  FALLS:  Has patient fallen in last 6 months? 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 needs to prepare for radiation and surgery  PERTINENT HISTORY:  Stage III Rectal cancer, chemotherapy, peripheral neuropathy Sexual abuse: No  BOWEL MOVEMENT: Pain with bowel movement: No Type of bowel movement:Frequency 2x/day and Strain  No Fully empty rectum: Yes: - Leakage: No Pads: No Fiber supplement: No  URINATION: Pain with urination: No Fully empty bladder: Yes: - Stream: Strong Urgency: No Frequency: 3-4x/day, 1x/night Leakage:  none Pads: No  INTERCOURSE: Pain with intercourse:  none  - not currently sexually active  OBJECTIVE:   DIAGNOSTIC FINDINGS:  CT/MRI  COGNITION: Overall cognitive status: Within functional limits for tasks assessed     SENSATION: Pt reports neuropathy/numbness/tingling Rt LE  MUSCLE LENGTH: dec hip flexors, posterior hip, and hamstrings bil   FUNCTIONAL TESTS:  Squat: WNL Single leg stance: WNL  GAIT: Comments: WNL  POSTURE: forward head, increased lumbar lordosis, and anterior pelvic tilt  PELVIC ALIGNMENT: WNL  LUMBARAROM/PROM: grossly decreased by 50% in all directions   LOWER EXTREMITY ROM: dec bil hip ER/extension   LOWER EXTREMITY MMT: sit up test: 3/3; able to activate transversus abdominus  MMT Right eval Left eval  Hip flexion 5 5  Hip extension 4 4  Hip abduction 3+ 4+  Hip adduction 4 4  Hip internal rotation 5 5  Hip external rotation 4- 5  Knee flexion    Knee extension    Ankle dorsiflexion    Ankle plantarflexion    Ankle inversion    Ankle eversion     PALPATION:   General  NA                External Perineal Exam NA                             Internal Pelvic Floor NA  Patient confirms  identification and approves PT to assess internal pelvic floor and treatment NA  PELVIC MMT: NA   MMT eval  Vaginal   Internal Anal Sphincter   External Anal Sphincter   Puborectalis   Diastasis Recti   (Blank rows = not tested)        TONE: NA  PROLAPSE: NA  TODAY'S TREATMENT:                                                                                                                              DATE: 09/02/22  EVAL  Exercises: Open books Cat/cow Child's pose Piriformis stretch Hamstring stretch Bent knee fall out Butterfly Pelvic tilts    PATIENT EDUCATION:  Education details: initial HEP Person educated: Patient Education method: Consulting civil engineer, Media planner, Corporate treasurer cues, Verbal cues, and Handouts Education comprehension: verbalized understanding  HOME EXERCISE PROGRAM: [id number]  ASSESSMENT:  CLINICAL IMPRESSION: Patient is a 52 y.o. male who was seen today for physical therapy evaluation and treatment for core strengthening and mobility leading up to radiation and surgery. Pt is in very good shape at this time with good functional ability and LE/core strength; he is not having any difficulty with bowel or bladder at this time and has no history of sexual dysfunction. He does have decreased mobility in hips and low back  which may worsen if not addressed after radiation and surgery. He was able to start mobility program this session with good tolerance. We will schedule 1 appointment in 6 weeks once he is done with chemo to review and progress mobility exercises to tolerance and begin going over toileting mechanics,pelvic floor A/ROM, and manual techniques he can perform in order to prevent any side effects from radiation. He will continue to benefit from skilled PT intervention in order to minimize impacts of cancer treatment on core musculature and functional ability.    OBJECTIVE IMPAIRMENTS: decreased coordination and decreased mobility.   ACTIVITY  LIMITATIONS: NA  PARTICIPATION LIMITATIONS:   PERSONAL FACTORS: 1 comorbidity: rectal cancer and treatment  are also affecting patient's functional outcome.   REHAB POTENTIAL: Good  CLINICAL DECISION MAKING: Stable/uncomplicated  EVALUATION COMPLEXITY: Low   GOALS: Goals reviewed with patient? Yes  SHORT TERM GOALS: Target date: 10/07/22  Pt will be independent with HEP.   Baseline: Goal status: INITIAL    LONG TERM GOALS: Target date: 02/17/23  Pt will be independent with advanced HEP.   Baseline:  Goal status: INITIAL  2.  Pt will increase all impaired lumbar A/ROM by 25% without pain.  Baseline:  Goal status: INITIAL  3.  Pt will demonstrate improved anterior/posterior chain flexibility. Baseline:  Goal status: INITIAL  4.  Pt will be independent with diaphragmatic breathing and down training activities in order to improve pelvic floor/abdominal relaxation.  Baseline:  Goal status: INITIAL  PLAN:  PT FREQUENCY: 1x/week  PT DURATION: 6 months  PLANNED INTERVENTIONS: Therapeutic exercises, Therapeutic activity, Neuromuscular re-education, Balance training, Gait training, Patient/Family education, Self Care, Joint mobilization, Dry Needling, Biofeedback, and Manual therapy  PLAN FOR NEXT SESSION: Continue breath education for improved abdominal/pelvic floor relaxation; toileting mechanics; teach manual techniques for abdominal mobilization; anterior chain flexibility.   Heather Roberts, PT, DPT01/10/245:13 PM

## 2022-09-03 ENCOUNTER — Encounter: Payer: Self-pay | Admitting: Genetic Counselor

## 2022-09-03 ENCOUNTER — Other Ambulatory Visit: Payer: Self-pay

## 2022-09-03 ENCOUNTER — Telehealth: Payer: Self-pay | Admitting: Genetic Counselor

## 2022-09-03 DIAGNOSIS — Z1379 Encounter for other screening for genetic and chromosomal anomalies: Secondary | ICD-10-CM | POA: Insufficient documentation

## 2022-09-03 NOTE — Telephone Encounter (Signed)
I contacted Mr. Prokop to discuss his genetic testing results. No pathogenic variants were identified in the 48 genes analyzed. Detailed clinic note to follow.  The test report has been scanned into EPIC and is located under the Molecular Pathology section of the Results Review tab.  A portion of the result report is included below for reference.   Lucille Passy, MS, [facility] Genetic Counselor [email or web address] (P) [phone]

## 2022-09-07 ENCOUNTER — Inpatient Hospital Stay: Payer: 59 | Admitting: Dietician

## 2022-09-07 ENCOUNTER — Inpatient Hospital Stay: Payer: 59 | Admitting: Hematology

## 2022-09-07 ENCOUNTER — Ambulatory Visit: Payer: Self-pay | Admitting: Genetic Counselor

## 2022-09-07 ENCOUNTER — Inpatient Hospital Stay: Payer: 59

## 2022-09-07 DIAGNOSIS — C20 Malignant neoplasm of rectum: Secondary | ICD-10-CM | POA: Diagnosis not present

## 2022-09-07 DIAGNOSIS — Z95828 Presence of other vascular implants and grafts: Secondary | ICD-10-CM

## 2022-09-07 DIAGNOSIS — Z79899 Other long term (current) drug therapy: Secondary | ICD-10-CM | POA: Diagnosis not present

## 2022-09-07 DIAGNOSIS — Z5111 Encounter for antineoplastic chemotherapy: Secondary | ICD-10-CM | POA: Diagnosis not present

## 2022-09-07 DIAGNOSIS — Z5189 Encounter for other specified aftercare: Secondary | ICD-10-CM | POA: Diagnosis not present

## 2022-09-07 DIAGNOSIS — Z1379 Encounter for other screening for genetic and chromosomal anomalies: Secondary | ICD-10-CM

## 2022-09-07 LAB — CBC WITH DIFFERENTIAL/PLATELET
Abs Immature Granulocytes: 0.07 10*3/uL (ref 0.00–0.07)
Basophils Absolute: 0 10*3/uL (ref 0.0–0.1)
Basophils Relative: 1 %
Eosinophils Absolute: 0.2 10*3/uL (ref 0.0–0.5)
Eosinophils Relative: 4 %
HCT: 41.2 % (ref 39.0–52.0)
Hemoglobin: 14.3 g/dL (ref 13.0–17.0)
Immature Granulocytes: 2 %
Lymphocytes Relative: 24 %
Lymphs Abs: 1.1 10*3/uL (ref 0.7–4.0)
MCH: 32.8 pg (ref 26.0–34.0)
MCHC: 34.7 g/dL (ref 30.0–36.0)
MCV: 94.5 fL (ref 80.0–100.0)
Monocytes Absolute: 0.7 10*3/uL (ref 0.1–1.0)
Monocytes Relative: 15 %
Neutro Abs: 2.5 10*3/uL (ref 1.7–7.7)
Neutrophils Relative %: 54 %
Platelets: 89 10*3/uL — ABNORMAL LOW (ref 150–400)
RBC: 4.36 MIL/uL (ref 4.22–5.81)
RDW: 16.2 % — ABNORMAL HIGH (ref 11.5–15.5)
WBC: 4.6 10*3/uL (ref 4.0–10.5)
nRBC: 0 % (ref 0.0–0.2)

## 2022-09-07 LAB — COMPREHENSIVE METABOLIC PANEL
ALT: 45 U/L — ABNORMAL HIGH (ref 0–44)
AST: 49 U/L — ABNORMAL HIGH (ref 15–41)
Albumin: 3.6 g/dL (ref 3.5–5.0)
Alkaline Phosphatase: 184 U/L — ABNORMAL HIGH (ref 38–126)
Anion gap: 10 (ref 5–15)
BUN: 16 mg/dL (ref 6–20)
CO2: 23 mmol/L (ref 22–32)
Calcium: 8.6 mg/dL — ABNORMAL LOW (ref 8.9–10.3)
Chloride: 104 mmol/L (ref 98–111)
Creatinine, Ser: 0.99 mg/dL (ref 0.61–1.24)
GFR, Estimated: >60 mL/min (ref >60)
Glucose, Bld: 130 mg/dL — ABNORMAL HIGH (ref 70–99)
Potassium: 3.6 mmol/L (ref 3.5–5.1)
Sodium: 137 mmol/L (ref 135–145)
Total Bilirubin: 0.7 mg/dL (ref 0.3–1.2)
Total Protein: 6.9 g/dL (ref 6.5–8.1)

## 2022-09-07 LAB — MAGNESIUM: Magnesium: 2.1 mg/dL (ref 1.7–2.4)

## 2022-09-07 MED ORDER — FLUOROURACIL CHEMO INJECTION 2.5 GM/50ML
400.0000 mg/m2 | Freq: Once | INTRAVENOUS | Status: AC
  Administered 2022-09-07: 800 mg via INTRAVENOUS
  Filled 2022-09-07: qty 16

## 2022-09-07 MED ORDER — DEXTROSE 5 % IV SOLN: Freq: Once | INTRAVENOUS | Status: AC

## 2022-09-07 MED ORDER — SODIUM CHLORIDE 0.9% FLUSH
10.0000 mL | Freq: Once | INTRAVENOUS | Status: AC
  Administered 2022-09-07: 10 mL via INTRAVENOUS

## 2022-09-07 MED ORDER — SODIUM CHLORIDE 0.9 % IV SOLN
150.0000 mg | Freq: Once | INTRAVENOUS | Status: AC
  Administered 2022-09-07: 150 mg via INTRAVENOUS
  Filled 2022-09-07: qty 5

## 2022-09-07 MED ORDER — PALONOSETRON HCL INJECTION 0.25 MG/5ML
0.2500 mg | Freq: Once | INTRAVENOUS | Status: AC
  Administered 2022-09-07: 0.25 mg via INTRAVENOUS
  Filled 2022-09-07: qty 5

## 2022-09-07 MED ORDER — OXALIPLATIN CHEMO INJECTION 100 MG/20ML
68.0000 mg/m2 | Freq: Once | INTRAVENOUS | Status: AC
  Administered 2022-09-07: 135 mg via INTRAVENOUS
  Filled 2022-09-07: qty 27

## 2022-09-07 MED ORDER — SODIUM CHLORIDE 0.9 % IV SOLN
2500.0000 mg/m2 | INTRAVENOUS | Status: DC
  Administered 2022-09-07: 5000 mg via INTRAVENOUS
  Filled 2022-09-07: qty 100

## 2022-09-07 MED ORDER — MEGESTROL ACETATE 400 MG/10ML PO SUSP: 400.0000 mg | Freq: Two times a day (BID) | ORAL | 3 refills | Status: DC

## 2022-09-07 MED ORDER — LEUCOVORIN CALCIUM INJECTION 350 MG
400.0000 mg/m2 | Freq: Once | INTRAVENOUS | Status: AC
  Administered 2022-09-07: 800 mg via INTRAVENOUS
  Filled 2022-09-07: qty 40

## 2022-09-07 MED ORDER — SODIUM CHLORIDE 0.9 % IV SOLN
10.0000 mg | Freq: Once | INTRAVENOUS | Status: AC
  Administered 2022-09-07: 10 mg via INTRAVENOUS
  Filled 2022-09-07: qty 1

## 2022-09-07 NOTE — Progress Notes (Signed)
Patient has been examined by Dr. Delton Coombes, and vital signs and labs have been reviewed. ANC, Creatinine, LFTs, hemoglobin, and platelets (89,000) are within treatment parameters per M.D. - pt may proceed with treatment.  Primary RN and pharmacy notified.

## 2022-09-07 NOTE — Progress Notes (Signed)
[address], [place] [address]   CLINIC:  Medical Oncology/Hematology  PCP:  Practice, [facility] [address] [place]  [id number] [phone]   REASON FOR VISIT:  Follow-up for stage III rectal cancer  PRIOR THERAPY: None  NGS Results: MSI-stable, MMR-preserved  CURRENT THERAPY: FOLFOX  BRIEF ONCOLOGIC HISTORY:  Oncology History  Rectal carcinoma ([place])  06/17/2022 Initial Diagnosis   Rectal carcinoma ([place])   06/29/2022 -  Chemotherapy   Patient is on Treatment Plan : COLORECTAL FOLFOX q14d x 4 months      Genetic Testing   [facility] Common Cancer Panel+RNA was Negative. Report date is 08/29/2022.  The Common Hereditary Cancers Panel offered by [facility] includes sequencing and/or deletion duplication testing of the following 48 genes: APC, ATM, AXIN2, BAP1, BARD1, BMPR1A, BRCA1, BRCA2, BRIP1, CDH1, CDK4, CDKN2A (p14ARF and p16INK4a only), CHEK2, CTNNA1, DICER1, EPCAM (Deletion/duplication testing only), FH, GREM1 (promoter region duplication testing only), HOXB13, KIT, MBD4, MEN1, MLH1, MSH2, MSH3, MSH6, MUTYH, NF1, NHTL1, PALB2, PDGFRA, PMS2, POLD1, POLE, PTEN, RAD51C, RAD51D, SDHA (sequencing analysis only except exon 14), SDHB, SDHC, SDHD, SMAD4, SMARCA4. STK11, TP53, TSC1, TSC2, and VHL.     CANCER STAGING:  Cancer Staging  Rectal carcinoma [facility]) Staging form: Colon and Rectum, AJCC 8th Edition - Clinical stage from 06/18/2022: Stage IIIB (cT3, cN1, cM0) - Unsigned    INTERVAL HISTORY:  Troy Dillon 52 y.o. male seen for follow-up of rectal cancer and toxicity assessment prior to cycle 6 of chemotherapy.  He reported change in taste and decreased appetite.  Weight is stable.  Energy levels are 75%.  After dose reduction at last cycle, numbness in the right foot has improved.  Denies any bleeding per rectum at this time.  REVIEW OF SYSTEMS:  Review of Systems  Neurological:  Positive for numbness (Right foot).  All other systems  reviewed and are negative.    PAST MEDICAL/SURGICAL HISTORY:  Past Medical History:  Diagnosis Date   Rectal bleeding    Rectal cancer ([place]) 06/08/2022   Past Surgical History:  Procedure Laterality Date   COLONOSCOPY  06/08/2022   EYE SURGERY     FRACTURE SURGERY     Right Femur- Cables in bone remain in place, rod removed in 2000   IR IMAGING GUIDED PORT INSERTION  06/22/2022   ORTHOPEDIC SURGERY       SOCIAL HISTORY:  Social History   Socioeconomic History   Marital status: Married    Spouse name: Not on file   Number of children: Not on file   Years of education: Not on file   Highest education level: Not on file  Occupational History   Not on file  Tobacco Use   Smoking status: Former    Types: Cigarettes    Quit date: 2003    Years since quitting: 21.0   Smokeless tobacco: Current    Types: Snuff   Tobacco comments:    Some Dipping  Vaping Use   Vaping Use: Never used  Substance and Sexual Activity   Alcohol use: Yes   Drug use: Never   Sexual activity: Not on file  Other Topics Concern   Not on file  Social History Narrative   Not on file   Social Determinants of Health   Financial Resource Strain: Not on file  Food Insecurity: Not on file  Transportation Needs: Not on file  Physical Activity: Not on file  Stress: Not on file  Social Connections: Not  on file  Intimate Partner Violence: Not on file    FAMILY HISTORY:  Family History  Problem Relation Age of Onset   Cancer Mother 19 - 17       GYN malignancy details unknown, TAH/BSO   Colon cancer Father 25   Heart attack Father    Colon cancer Paternal Aunt 27   Colon cancer Paternal Grandmother 78    CURRENT MEDICATIONS:  Outpatient Encounter Medications as of 09/07/2022  Medication Sig   Brinzolamide-Brimonidine 1-0.2 % SUSP    dextrose 5 % SOLN 1,000 mL with fluorouracil 5 GM/100ML SOLN Inject into the vein over 48 hr. Every 14 days   FLUOROURACIL IV Inject into the vein every 14  (fourteen) days.   ibuprofen (ADVIL) 200 MG tablet Take 200 mg by mouth every 6 (six) hours as needed for moderate pain.   LEUCOVORIN CALCIUM IV Inject into the vein every 14 (fourteen) days.   lidocaine-prilocaine (EMLA) cream Apply a small amount to port a cath site and cover with plastic wrap one hour prior to infusion appointments   megestrol (MEGACE) 400 MG/10ML suspension Take 10 mLs (400 mg total) by mouth 2 (two) times daily.   Multiple Vitamin (MULTIVITAMIN) tablet Take 1 tablet by mouth daily.   ondansetron (ZOFRAN) 8 MG tablet Take 1 tablet (8 mg total) by mouth every 8 (eight) hours as needed.   OXALIPLATIN IV Inject into the vein every 14 (fourteen) days.   prochlorperazine (COMPAZINE) 10 MG tablet Take 1 tablet (10 mg total) by mouth every 6 (six) hours as needed for nausea or vomiting.   promethazine (PHENERGAN) 25 MG suppository Place 1 suppository (25 mg total) rectally every 6 (six) hours as needed for nausea or vomiting.   Salicylic Acid, Acne, (SALICYLIC ACID EX) Apply 1 Application topically daily as needed (acne).   sodium chloride (OCEAN) 0.65 % SOLN nasal spray Place 1 spray into both nostrils as needed for congestion.   Facility-Administered Encounter Medications as of 09/07/2022  Medication   [COMPLETED] sodium chloride flush (NS) 0.9 % injection 10 mL    ALLERGIES:  Allergies  Allergen Reactions   Hydrocodone Itching   Latex     Eye irritation      PHYSICAL EXAM:  ECOG Performance status: 0  There were no vitals filed for this visit. There were no vitals filed for this visit. Physical Exam Vitals reviewed.  Constitutional:      Appearance: Normal appearance.  Cardiovascular:     Rate and Rhythm: Normal rate and regular rhythm.     Pulses: Normal pulses.     Heart sounds: Normal heart sounds.  Pulmonary:     Effort: Pulmonary effort is normal.     Breath sounds: Normal breath sounds.  Neurological:     Mental Status: He is alert.  Psychiatric:         Mood and Affect: Mood normal.        Behavior: Behavior normal.      LABORATORY DATA:  I have reviewed the labs as listed.  CBC    Component Value Date/Time   WBC 4.6 09/07/2022 0901   RBC 4.36 09/07/2022 0901   HGB 14.3 09/07/2022 0901   HCT 41.2 09/07/2022 0901   PLT 89 (L) 09/07/2022 0901   MCV 94.5 09/07/2022 0901   MCH 32.8 09/07/2022 0901   MCHC 34.7 09/07/2022 0901   RDW 16.2 (H) 09/07/2022 0901   LYMPHSABS 1.1 09/07/2022 0901   MONOABS 0.7 09/07/2022 0901   EOSABS  0.2 09/07/2022 0901   BASOSABS 0.0 09/07/2022 0901      Latest Ref Rng & Units 09/07/2022    9:01 AM 08/25/2022    9:12 AM 08/10/2022    8:43 AM  CMP  Glucose 70 - 99 mg/dL 130  104  105   BUN 6 - 20 mg/dL '16  11  11   '$ Creatinine 0.61 - 1.24 mg/dL 0.99  1.02  0.99   Sodium 135 - 145 mmol/L 137  136  140   Potassium 3.5 - 5.1 mmol/L 3.6  4.0  4.1   Chloride 98 - 111 mmol/L 104  104  109   CO2 22 - 32 mmol/L '23  25  24   '$ Calcium 8.9 - 10.3 mg/dL 8.6  8.7  8.6   Total Protein 6.5 - 8.1 g/dL 6.9  6.5  6.4   Total Bilirubin 0.3 - 1.2 mg/dL 0.7  1.2  0.6   Alkaline Phos 38 - 126 U/L 184  171  156   AST 15 - 41 U/L 49  46  34   ALT 0 - 44 U/L 45  58  39     DIAGNOSTIC IMAGING:  I have independently reviewed the scans and discussed with the patient.  ASSESSMENT:  1.  Stage IIIB (T3cN1) rectal adenocarcinoma, MSI stable: - Presentation with rectal bleeding - Colonoscopy (06/08/2022): Fungating partially obstructing large mass found in the rectum 11 cm from anal verge.  Partially circumferential involving one half of the lumen.  Mass measured 3 cm in length.  Diameter 2 cm.  Oozing present.  1 semipedunculated polyp (40 cm from anus) and 3 sessile polyps in the cecum removed. - Pathology (06/08/2022): Rectal mass 11 cm from anal verge-adenocarcinoma.  MMR preserved.  MSI-stable. - CEA (06/08/2022): 2.9 - MRI pelvis (06/10/2022): T3CN1, tumor extends into sigmoid from the high rectum, extension through  muscularis propria, approximately 6 mm beyond the rectal wall.  Tumor begins in the highly portion of the rectum and extends into the sigmoid colon.  Extramural vascular invasion/tumor thrombus along the right lateral margin.  Shortest distance of tumor from mesorectal fascia: 15 mm.  Single high mesorectal or superior rectal lymph node measuring 9 mm.  Distance from tumor to internal anal sphincter is approximately 7-11 cm. - CT CAP (06/10/2022): Circumferential wall thickening of the upper/mid rectum, mildly enlarged high perirectal lymph nodes measuring up to 6 mm in short axis.  No evidence of distant metastatic disease in the chest, abdomen or pelvis. - Neoadjuvant FOLFOX cycle 1 on 06/29/2022   2.  Social/family history: - He lives at home with his wife Lattie Haw.  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  Denies any chemical exposure.  Does not smoke cigarettes but dips tobacco. - Father had colon cancer at age 44. - Paternal grandmother died of colon cancer. - Paternal aunt had colon cancer. - Mother had "male cancer" and underwent hysterectomy.    PLAN:  1.  Stage IIIb (T3CN1) rectal adenocarcinoma, MSI-stable: - Reviewed labs today which showed AST and ALT elevated at 49 and 45.  Alk phos is 184.  The rest of LFTs are normal.  CBC shows thrombocytopenia with PLT 89. - Proceed with cycle 6 with oxaliplatin 20% dose reduction. - RTC 2 weeks for follow-up with labs and treatment.  Will plan to repeat scans after completion of 8 cycles.  2.  Nausea/vomiting: - Continue Zofran and Phenergan as needed.  3.  Peripheral neuropathy: - He has  on and off numbness in the right foot and toes and fingertips. - Will continue oxaliplatin dose reduction by 20%.  4.  Decreased appetite: - Reports decreased appetite and taste changes. - Will start him on Megace 400 mg twice daily.      Orders placed this encounter:  No orders of the defined types were placed in this  encounter.     Derek Jack, MD [place] [phone]

## 2022-09-07 NOTE — Progress Notes (Signed)
Patient presents today for chemotherapy infusion.  Patient is in satisfactory condition with no new complaints voiced.  Vital signs are stable.  Labs reviewed by Dr. Delton Coombes during his office visit. All labs are within treatment parameters.  We will proceed with treatment per MD orders.   Patient tolerated treatment well with no complaints voiced.  Home infusion 5FU pump connected with no difficulty.  Patient left ambulatory with wife in stable condition.  Vital signs stable at discharge.  Follow up as scheduled.

## 2022-09-07 NOTE — Progress Notes (Signed)
HPI:   Troy Dillon was previously seen in the [facility] clinic due to a personal and family history of cancer and concerns regarding a hereditary predisposition to cancer. Please refer to our prior cancer genetics clinic note for more information regarding our discussion, assessment and recommendations, at the time. Mr. Giangregorio recent genetic test results were disclosed to him, as were recommendations warranted by these results. These results and recommendations are discussed in more detail below.  CANCER HISTORY:  Oncology History  Rectal carcinoma ([place])  06/17/2022 Initial Diagnosis   Rectal carcinoma ([place])   06/29/2022 -  Chemotherapy   Patient is on Treatment Plan : COLORECTAL FOLFOX q14d x 4 months      Genetic Testing   [facility] Common Cancer Panel+RNA was Negative. Report date is 08/29/2022.  The Common Hereditary Cancers Panel offered by [facility] includes sequencing and/or deletion duplication testing of the following 48 genes: APC, ATM, AXIN2, BAP1, BARD1, BMPR1A, BRCA1, BRCA2, BRIP1, CDH1, CDK4, CDKN2A (p14ARF and p16INK4a only), CHEK2, CTNNA1, DICER1, EPCAM (Deletion/duplication testing only), FH, GREM1 (promoter region duplication testing only), HOXB13, KIT, MBD4, MEN1, MLH1, MSH2, MSH3, MSH6, MUTYH, NF1, NHTL1, PALB2, PDGFRA, PMS2, POLD1, POLE, PTEN, RAD51C, RAD51D, SDHA (sequencing analysis only except exon 14), SDHB, SDHC, SDHD, SMAD4, SMARCA4. STK11, TP53, TSC1, TSC2, and VHL.     FAMILY HISTORY:  We obtained a detailed, 4-generation family history.  Significant diagnoses are listed below:      Family History  Problem Relation Age of Onset   Cancer Mother 63 - 41        GYN malignancy details unknown, TAH/BSO   Colon cancer Father 84   Heart attack Father     Colon cancer Paternal Aunt 21   Colon cancer Paternal Grandmother 23       Mr. Merced father was diagnosed with colon cancer at age 3, he died at age 42. Mr. Favila has three paternal aunts.  One paternal aunt was diagnosed with colon cancer at age 59 and died at age 85. His paternal grandmother was diagnosed with colon cancer at age 28 and died at age 2 due to metastatic colon cancer. His mother was diagnosed with a male reproductive cancer and had a TAH/BSO as part of her treatment, she is 33. Mr. Bielby is unaware of previous family history of genetic testing for hereditary cancer risks. There is no reported Ashkenazi Jewish ancestry.    GENETIC TEST RESULTS:  The [employer] Common Cancer Panel found no pathogenic mutations.  The Common Hereditary Cancers Panel offered by [facility] includes sequencing and/or deletion duplication testing of the following 48 genes: APC, ATM, AXIN2, BAP1, BARD1, BMPR1A, BRCA1, BRCA2, BRIP1, CDH1, CDK4, CDKN2A (p14ARF and p16INK4a only), CHEK2, CTNNA1, DICER1, EPCAM (Deletion/duplication testing only), FH, GREM1 (promoter region duplication testing only), HOXB13, KIT, MBD4, MEN1, MLH1, MSH2, MSH3, MSH6, MUTYH, NF1, NHTL1, PALB2, PDGFRA, PMS2, POLD1, POLE, PTEN, RAD51C, RAD51D, SDHA (sequencing analysis only except exon 14), SDHB, SDHC, SDHD, SMAD4, SMARCA4. STK11, TP53, TSC1, TSC2, and VHL.   The test report has been scanned into EPIC and is located under the Molecular Pathology section of the Results Review tab.  A portion of the result report is included below for reference. Genetic testing reported out on 08/29/2022.       Even though a pathogenic variant was not identified, possible explanations for the cancer in the family may include: There may be no hereditary risk for cancer in the family. The cancers in Mr. Peddy and/or his family  may be due to other genetic or environmental factors. There may be a gene mutation in one of these genes that current testing methods cannot detect, but that chance is small. There could be another gene that has not yet been discovered, or that we have not yet tested, that is responsible for the cancer diagnoses in the  family.  It is also possible there is a hereditary cause for the cancer in the family that Mr. Albornoz did not inherit.  Therefore, it is important to remain in touch with cancer genetics in the future so that we can continue to offer Mr. Salminen the most up to date genetic testing.   ADDITIONAL GENETIC TESTING:  We discussed with Mr. Feger that his genetic testing was fairly extensive.  If there are genes identified to increase cancer risk that can be analyzed in the future, we would be happy to discuss and coordinate this testing at that time.    CANCER SCREENING RECOMMENDATIONS:  Mr. Shanahan test result is considered negative (normal). This means that we have not identified a hereditary cause for his personal and family history of cancer at this time.    An individual's cancer risk and medical management are not determined by genetic test results alone. Overall cancer risk assessment incorporates additional factors, including personal medical history, family history, and any available genetic information that may result in a personalized plan for cancer prevention and surveillance. Therefore, it is recommended he continue to follow the cancer management and screening guidelines provided by his oncology and primary healthcare provider.  Colon Cancer Screening: Due to Mr. Barabas personal history of rectal cancer, his children are recommended to begin colonoscopies at age 20 (82 years prior to his age at diagnosis) and repeat every 5 years. More frequent colonoscopies may be recommended if polyps are identified.  RECOMMENDATIONS FOR FAMILY MEMBERS:   Since he did not inherit a mutation in a cancer predisposition gene included on this panel, his children could not have inherited a mutation from him in one of these genes. Other members of the family may still carry a pathogenic variant in one of these genes that Mr. Pesqueira did not inherit. Based on the family history, we recommend his  siblings have genetic counseling and testing.   FOLLOW-UP:  Cancer genetics is a rapidly advancing field and it is possible that new genetic tests will be appropriate for him and/or his family members in the future. We encouraged him to remain in contact with cancer genetics on an annual basis so we can update his personal and family histories and let him know of advances in cancer genetics that may benefit this family.   Our contact number was provided. Mr. Salois questions were answered to his satisfaction, and he knows he is welcome to call us at anytime with additional questions or concerns.   Lucille Passy, MS, [facility] Genetic Counselor [email or web address] (P) [phone]

## 2022-09-07 NOTE — Patient Instructions (Signed)
[place]  Discharge Instructions: Thank you for choosing [facility] to provide your oncology and hematology care.  If you have a lab appointment with the [facility], please come in thru the Main Entrance and check in at the main information desk.  Wear comfortable clothing and clothing appropriate for easy access to any Portacath or PICC line.   We strive to give you quality time with your provider. You may need to reschedule your appointment if you arrive late (15 or more minutes).  Arriving late affects you and other patients whose appointments are after yours.  Also, if you miss three or more appointments without notifying the office, you may be dismissed from the clinic at the provider's discretion.      For prescription refill requests, have your pharmacy contact our office and allow 72 hours for refills to be completed.    Today you received the following chemotherapy and/or immunotherapy agents Leucovorin/Oxaliplatin/Fluorouracil.  Leucovorin Injection What is this medication? LEUCOVORIN (loo koe VOR in) prevents side effects from certain medications, such as methotrexate. It works by increasing folate levels. This helps protect healthy cells in your body. It may also be used to treat anemia caused by low levels of folate. It can also be used with fluorouracil, a type of chemotherapy, to treat colorectal cancer. It works by increasing the effects of fluorouracil in the body. This medicine may be used for other purposes; ask your health care provider or pharmacist if you have questions. What should I tell my care team before I take this medication? They need to know if you have any of these conditions: Anemia from low levels of vitamin B12 in the blood An unusual or allergic reaction to leucovorin, folic acid, other medications, foods, dyes, or preservatives Pregnant or trying to get pregnant Breastfeeding How should I use this medication? This  medication is injected into a vein or a muscle. It is given by your care team in a hospital or clinic setting. Talk to your care team about the use of this medication in children. Special care may be needed. Overdosage: If you think you have taken too much of this medicine contact a poison control center or emergency room at once. NOTE: This medicine is only for you. Do not share this medicine with others. What if I miss a dose? Keep appointments for follow-up doses. It is important not to miss your dose. Call your care team if you are unable to keep an appointment. What may interact with this medication? Capecitabine Fluorouracil Phenobarbital Phenytoin Primidone Trimethoprim;sulfamethoxazole This list may not describe all possible interactions. Give your health care provider a list of all the medicines, herbs, non-prescription drugs, or dietary supplements you use. Also tell them if you smoke, drink alcohol, or use illegal drugs. Some items may interact with your medicine. What should I watch for while using this medication? Your condition will be monitored carefully while you are receiving this medication. This medication may increase the side effects of 5-fluorouracil. Tell your care team if you have diarrhea or mouth sores that do not get better or that get worse. What side effects may I notice from receiving this medication? Side effects that you should report to your care team as soon as possible: Allergic reactions--skin rash, itching, hives, swelling of the face, lips, tongue, or throat This list may not describe all possible side effects. Call your doctor for medical advice about side effects. You may report side effects to FDA  at 1-800-FDA-1088. Where should I keep my medication? This medication is given in a hospital or clinic. It will not be stored at home. NOTE: This sheet is a summary. It may not cover all possible information. If you have questions about this medicine, talk to  your doctor, pharmacist, or health care provider.  2023 Elsevier/Gold Standard (2022-01-13 00:00:00)    Oxaliplatin Injection What is this medication? OXALIPLATIN (ox AL i PLA tin) treats some types of cancer. It works by slowing down the growth of cancer cells. This medicine may be used for other purposes; ask your health care provider or pharmacist if you have questions. COMMON BRAND NAME(S): Eloxatin What should I tell my care team before I take this medication? They need to know if you have any of these conditions: Heart disease History of irregular heartbeat or rhythm Liver disease Low blood cell levels (white cells, red cells, and platelets) Lung or breathing disease, such as asthma Take medications that treat or prevent blood clots Tingling of the fingers, toes, or other nerve disorder An unusual or allergic reaction to oxaliplatin, other medications, foods, dyes, or preservatives If you or your partner are pregnant or trying to get pregnant Breast-feeding How should I use this medication? This medication is injected into a vein. It is given by your care team in a hospital or clinic setting. Talk to your care team about the use of this medication in children. Special care may be needed. Overdosage: If you think you have taken too much of this medicine contact a poison control center or emergency room at once. NOTE: This medicine is only for you. Do not share this medicine with others. What if I miss a dose? Keep appointments for follow-up doses. It is important not to miss a dose. Call your care team if you are unable to keep an appointment. What may interact with this medication? Do not take this medication with any of the following: Cisapride Dronedarone Pimozide Thioridazine This medication may also interact with the following: Aspirin and aspirin-like medications Certain medications that treat or prevent blood clots, such as warfarin, apixaban, dabigatran, and  rivaroxaban Cisplatin Cyclosporine Diuretics Medications for infection, such as acyclovir, adefovir, amphotericin B, bacitracin, cidofovir, foscarnet, ganciclovir, gentamicin, pentamidine, vancomycin NSAIDs, medications for pain and inflammation, such as ibuprofen or naproxen Other medications that cause heart rhythm changes Pamidronate Zoledronic acid This list may not describe all possible interactions. Give your health care provider a list of all the medicines, herbs, non-prescription drugs, or dietary supplements you use. Also tell them if you smoke, drink alcohol, or use illegal drugs. Some items may interact with your medicine. What should I watch for while using this medication? Your condition will be monitored carefully while you are receiving this medication. You may need blood work while taking this medication. This medication may make you feel generally unwell. This is not uncommon as chemotherapy can affect healthy cells as well as cancer cells. Report any side effects. Continue your course of treatment even though you feel ill unless your care team tells you to stop. This medication may increase your risk of getting an infection. Call your care team for advice if you get a fever, chills, sore throat, or other symptoms of a cold or flu. Do not treat yourself. Try to avoid being around people who are sick. Avoid taking medications that contain aspirin, acetaminophen, ibuprofen, naproxen, or ketoprofen unless instructed by your care team. These medications may hide a fever. Be careful brushing or flossing your  teeth or using a toothpick because you may get an infection or bleed more easily. If you have any dental work done, tell your dentist you are receiving this medication. This medication can make you more sensitive to cold. Do not drink cold drinks or use ice. Cover exposed skin before coming in contact with cold temperatures or cold objects. When out in cold weather wear warm clothing  and cover your mouth and nose to warm the air that goes into your lungs. Tell your care team if you get sensitive to the cold. Talk to your care team if you or your partner are pregnant or think either of you might be pregnant. This medication can cause serious birth defects if taken during pregnancy and for 9 months after the last dose. A negative pregnancy test is required before starting this medication. A reliable form of contraception is recommended while taking this medication and for 9 months after the last dose. Talk to your care team about effective forms of contraception. Do not father a child while taking this medication and for 6 months after the last dose. Use a condom while having sex during this time period. Do not breastfeed while taking this medication and for 3 months after the last dose. This medication may cause infertility. Talk to your care team if you are concerned about your fertility. What side effects may I notice from receiving this medication? Side effects that you should report to your care team as soon as possible: Allergic reactions--skin rash, itching, hives, swelling of the face, lips, tongue, or throat Bleeding--bloody or black, tar-like stools, vomiting blood or Arshawn Valdez material that looks like coffee grounds, red or dark Cherisa Brucker urine, small red or purple spots on skin, unusual bruising or bleeding Dry cough, shortness of breath or trouble breathing Heart rhythm changes--fast or irregular heartbeat, dizziness, feeling faint or lightheaded, chest pain, trouble breathing Infection--fever, chills, cough, sore throat, wounds that don't heal, pain or trouble when passing urine, general feeling of discomfort or being unwell Liver injury--right upper belly pain, loss of appetite, nausea, light-colored stool, dark yellow or Jordynn Marcella urine, yellowing skin or eyes, unusual weakness or fatigue Low red blood cell level--unusual weakness or fatigue, dizziness, headache, trouble  breathing Muscle injury--unusual weakness or fatigue, muscle pain, dark yellow or Sacoya Mcgourty urine, decrease in amount of urine Pain, tingling, or numbness in the hands or feet Sudden and severe headache, confusion, change in vision, seizures, which may be signs of posterior reversible encephalopathy syndrome (PRES) Unusual bruising or bleeding Side effects that usually do not require medical attention (report to your care team if they continue or are bothersome): Diarrhea Nausea Pain, redness, or swelling with sores inside the mouth or throat Unusual weakness or fatigue Vomiting This list may not describe all possible side effects. Call your doctor for medical advice about side effects. You may report side effects to FDA at 1-800-FDA-1088. Where should I keep my medication? This medication is given in a hospital or clinic. It will not be stored at home. NOTE: This sheet is a summary. It may not cover all possible information. If you have questions about this medicine, talk to your doctor, pharmacist, or health care provider.  2023 Elsevier/Gold Standard (2007-10-01 00:00:00)    Fluorouracil Injection What is this medication? FLUOROURACIL (flure oh YOOR a sil) treats some types of cancer. It works by slowing down the growth of cancer cells. This medicine may be used for other purposes; ask your health care provider or pharmacist if you  you have questions. COMMON BRAND NAME(S): Adrucil What should I tell my care team before I take this medication? They need to know if you have any of these conditions: Blood disorders Dihydropyrimidine dehydrogenase (DPD) deficiency Infection, such as chickenpox, cold sores, herpes Kidney disease Liver disease Poor nutrition Recent or ongoing radiation therapy An unusual or allergic reaction to fluorouracil, other medications, foods, dyes, or preservatives If you or your partner are pregnant or trying to get pregnant Breast-feeding How should I use this  medication? This medication is injected into a vein. It is administered by your care team in a hospital or clinic setting. Talk to your care team about the use of this medication in children. Special care may be needed. Overdosage: If you think you have taken too much of this medicine contact a poison control center or emergency room at once. NOTE: This medicine is only for you. Do not share this medicine with others. What if I miss a dose? Keep appointments for follow-up doses. It is important not to miss your dose. Call your care team if you are unable to keep an appointment. What may interact with this medication? Do not take this medication with any of the following: Live virus vaccines This medication may also interact with the following: Medications that treat or prevent blood clots, such as warfarin, enoxaparin, dalteparin This list may not describe all possible interactions. Give your health care provider a list of all the medicines, herbs, non-prescription drugs, or dietary supplements you use. Also tell them if you smoke, drink alcohol, or use illegal drugs. Some items may interact with your medicine. What should I watch for while using this medication? Your condition will be monitored carefully while you are receiving this medication. This medication may make you feel generally unwell. This is not uncommon as chemotherapy can affect healthy cells as well as cancer cells. Report any side effects. Continue your course of treatment even though you feel ill unless your care team tells you to stop. In some cases, you may be given additional medications to help with side effects. Follow all directions for their use. This medication may increase your risk of getting an infection. Call your care team for advice if you get a fever, chills, sore throat, or other symptoms of a cold or flu. Do not treat yourself. Try to avoid being around people who are sick. This medication may increase your risk  to bruise or bleed. Call your care team if you notice any unusual bleeding. Be careful brushing or flossing your teeth or using a toothpick because you may get an infection or bleed more easily. If you have any dental work done, tell your dentist you are receiving this medication. Avoid taking medications that contain aspirin, acetaminophen, ibuprofen, naproxen, or ketoprofen unless instructed by your care team. These medications may hide a fever. Do not treat diarrhea with over the counter products. Contact your care team if you have diarrhea that lasts more than 2 days or if it is severe and watery. This medication can make you more sensitive to the sun. Keep out of the sun. If you cannot avoid being in the sun, wear protective clothing and sunscreen. Do not use sun lamps, tanning beds, or tanning booths. Talk to your care team if you or your partner wish to become pregnant or think you might be pregnant. This medication can cause serious birth defects if taken during pregnancy and for 3 months after the last dose. A reliable form of contraception   is recommended while taking this medication and for 3 months after the last dose. Talk to your care team about effective forms of contraception. Do not father a child while taking this medication and for 3 months after the last dose. Use a condom while having sex during this time period. Do not breastfeed while taking this medication. This medication may cause infertility. Talk to your care team if you are concerned about your fertility. What side effects may I notice from receiving this medication? Side effects that you should report to your care team as soon as possible: Allergic reactions--skin rash, itching, hives, swelling of the face, lips, tongue, or throat Heart attack--pain or tightness in the chest, shoulders, arms, or jaw, nausea, shortness of breath, cold or clammy skin, feeling faint or lightheaded Heart failure--shortness of breath, swelling of  the ankles, feet, or hands, sudden weight gain, unusual weakness or fatigue Heart rhythm changes--fast or irregular heartbeat, dizziness, feeling faint or lightheaded, chest pain, trouble breathing High ammonia level--unusual weakness or fatigue, confusion, loss of appetite, nausea, vomiting, seizures Infection--fever, chills, cough, sore throat, wounds that don't heal, pain or trouble when passing urine, general feeling of discomfort or being unwell Low red blood cell level--unusual weakness or fatigue, dizziness, headache, trouble breathing Pain, tingling, or numbness in the hands or feet, muscle weakness, change in vision, confusion or trouble speaking, loss of balance or coordination, trouble walking, seizures Redness, swelling, and blistering of the skin over hands and feet Severe or prolonged diarrhea Unusual bruising or bleeding Side effects that usually do not require medical attention (report to your care team if they continue or are bothersome): Dry skin Headache Increased tears Nausea Pain, redness, or swelling with sores inside the mouth or throat Sensitivity to light Vomiting This list may not describe all possible side effects. Call your doctor for medical advice about side effects. You may report side effects to FDA at 1-800-FDA-1088. Where should I keep my medication? This medication is given in a hospital or clinic. It will not be stored at home. NOTE: This sheet is a summary. It may not cover all possible information. If you have questions about this medicine, talk to your doctor, pharmacist, or health care provider.  2023 Elsevier/Gold Standard (2021-12-09 00:00:00)        To help prevent nausea and vomiting after your treatment, we encourage you to take your nausea medication as directed.  BELOW ARE SYMPTOMS THAT SHOULD BE REPORTED IMMEDIATELY: *FEVER GREATER THAN 100.4 F (38 C) OR HIGHER *CHILLS OR SWEATING *NAUSEA AND VOMITING THAT IS NOT CONTROLLED WITH YOUR  NAUSEA MEDICATION *UNUSUAL SHORTNESS OF BREATH *UNUSUAL BRUISING OR BLEEDING *URINARY PROBLEMS (pain or burning when urinating, or frequent urination) *BOWEL PROBLEMS (unusual diarrhea, constipation, pain near the anus) TENDERNESS IN MOUTH AND THROAT WITH OR WITHOUT PRESENCE OF ULCERS (sore throat, sores in mouth, or a toothache) UNUSUAL RASH, SWELLING OR PAIN  UNUSUAL VAGINAL DISCHARGE OR ITCHING   Items with * indicate a potential emergency and should be followed up as soon as possible or go to the Emergency Department if any problems should occur.  Please show the CHEMOTHERAPY ALERT CARD or IMMUNOTHERAPY ALERT CARD at check-in to the Emergency Department and triage nurse.  Should you have questions after your visit or need to cancel or reschedule your appointment, please contact [facility] [phone]  and follow the prompts.  Office hours are 8:00 a.m. to 4:30 p.m. Monday - Friday. Please note that voicemails left after 4:00 p.m.   may not be returned until the following business day.  We are closed weekends and major holidays. You have access to a nurse at all times for urgent questions. Please call the main number to the clinic [phone] and follow the prompts.  For any non-urgent questions, you may also contact your provider using MyChart. We now offer e-Visits for anyone 18 and older to request care online for non-urgent symptoms. For details visit [email or web address].   Also download the MyChart app! Go to the app store, search "MyChart", open the app, select [facility], and log in with your MyChart username and password.   

## 2022-09-07 NOTE — Patient Instructions (Signed)
[place] at [facility] Discharge Instructions   You were seen and examined today by Dr. Delton Coombes.  He reviewed the results of your lab work, most of which are normal/stable. Your platelets are low at 89,000, but okay for treatment.   We will proceed with your treatment today.   Return as scheduled.    Thank you for choosing [place] at [facility] to provide your oncology and hematology care.  To afford each patient quality time with our provider, please arrive at least 15 minutes before your scheduled appointment time.   If you have a lab appointment with the [facility] please come in thru the Main Entrance and check in at the main information desk.  You need to re-schedule your appointment should you arrive 10 or more minutes late.  We strive to give you quality time with our providers, and arriving late affects you and other patients whose appointments are after yours.  Also, if you no show three or more times for appointments you may be dismissed from the clinic at the providers discretion.     Again, thank you for choosing [facility].  Our hope is that these requests will decrease the amount of time that you wait before being seen by our physicians.       _____________________________________________________________  Should you have questions after your visit to [facility], please contact our office at [phone] and follow the prompts.  Our office hours are 8:00 a.m. and 4:30 p.m. Monday - Friday.  Please note that voicemails left after 4:00 p.m. may not be returned until the following business day.  We are closed weekends and major holidays.  You do have access to a nurse 24-7, just call the main number to the clinic [phone] and do not press any options, hold on the line and a nurse will answer the phone.    For prescription refill requests, have your pharmacy contact our office and allow 72  hours.    Due to Covid, you will need to wear a mask upon entering the hospital. If you do not have a mask, a mask will be given to you at the Main Entrance upon arrival. For doctor visits, patients may have 1 support person age 7 or older with them. For treatment visits, patients can not have anyone with them due to social distancing guidelines and our immunocompromised population.

## 2022-09-07 NOTE — Progress Notes (Signed)
Nutrition Follow-up:  Patient with stage IIIB rectal cancer. He is receiving neoadjuvant chemotherapy with FOLOFOX q14d x 4 months (first 11/6) followed by concurrent radiation with xeloda. Plans for adjuvant surgery   1/2 - 20% dose reduction of oxaliplatin secondary to increased peripheral neuropathy  Met with patient and wife during infusion. Wife reports patients appetite has declined. He is planning to start appetite stimulant (megace). Altered taste continues. Patient reports squeezing lemon juice in ranch to enhance flavor. This worked for him. He no longer likes sweet things. Finds them too sweet. Nausea has improved some. Patient says he no longer needs antiemetics on week two.   Medications: Megace (1/15)  Labs: glucose 130, Ca 8.6  Anthropometrics: Wt 174 lb 9.6 oz today stable x 2 weeks  12/18 - 177 lb 9.6 oz  11/20 - 179 lb    NUTRITION DIAGNOSIS: Food and nutrition related knowledge deficit improved    INTERVENTION:  Reviewed strategies for altered taste, suggested choosing foods that are tart, marinating meats in tangy sauces, using lemon juice on foods prior to eating - handout with tips provided  Suggested baking soda salt water rinses several times daily before meals - recipe given    MONITORING, EVALUATION, GOAL: weight trends, intake   NEXT VISIT: To be scheduled

## 2022-09-09 ENCOUNTER — Inpatient Hospital Stay: Payer: 59

## 2022-09-09 ENCOUNTER — Encounter: Payer: Self-pay | Admitting: Genetic Counselor

## 2022-09-09 VITALS — BP 118/77 | HR 87 | Temp 98.3°F | Resp 18

## 2022-09-09 DIAGNOSIS — Z5189 Encounter for other specified aftercare: Secondary | ICD-10-CM | POA: Diagnosis not present

## 2022-09-09 DIAGNOSIS — Z95828 Presence of other vascular implants and grafts: Secondary | ICD-10-CM

## 2022-09-09 DIAGNOSIS — C20 Malignant neoplasm of rectum: Secondary | ICD-10-CM | POA: Diagnosis not present

## 2022-09-09 DIAGNOSIS — Z79899 Other long term (current) drug therapy: Secondary | ICD-10-CM | POA: Diagnosis not present

## 2022-09-09 DIAGNOSIS — Z5111 Encounter for antineoplastic chemotherapy: Secondary | ICD-10-CM | POA: Diagnosis not present

## 2022-09-09 MED ORDER — SODIUM CHLORIDE 0.9% FLUSH
10.0000 mL | INTRAVENOUS | Status: DC | PRN
  Administered 2022-09-09: 10 mL

## 2022-09-09 MED ORDER — HEPARIN SOD (PORK) LOCK FLUSH 100 UNIT/ML IV SOLN
500.0000 [IU] | Freq: Once | INTRAVENOUS | Status: AC | PRN
  Administered 2022-09-09: 500 [IU]

## 2022-09-09 MED ORDER — PEGFILGRASTIM-JMDB 6 MG/0.6ML ~~LOC~~ SOSY
6.0000 mg | PREFILLED_SYRINGE | Freq: Once | SUBCUTANEOUS | Status: AC
  Administered 2022-09-09: 6 mg via SUBCUTANEOUS
  Filled 2022-09-09: qty 0.6

## 2022-09-09 NOTE — Patient Instructions (Signed)
[facility]  Discharge Instructions: Thank you for choosing [facility] to provide your oncology and hematology care.  If you have a lab appointment with the Cancer Center, please come in thru the Main Entrance and check in at the main information desk.  Wear comfortable clothing and clothing appropriate for easy access to any Portacath or PICC line.   We strive to give you quality time with your provider. You may need to reschedule your appointment if you arrive late (15 or more minutes).  Arriving late affects you and other patients whose appointments are after yours.  Also, if you miss three or more appointments without notifying the office, you may be dismissed from the clinic at the provider's discretion.      For prescription refill requests, have your pharmacy contact our office and allow 72 hours for refills to be completed.    Today you received the following chemotherapy and/or immunotherapy agents Fulphila.  Pegfilgrastim Injection What is this medication? PEGFILGRASTIM (PEG fil gra stim) lowers the risk of infection in people who are receiving chemotherapy. It works by helping your body make more white blood cells, which protects your body from infection. It may also be used to help people who have been exposed to high doses of radiation. This medicine may be used for other purposes; ask your health care provider or pharmacist if you have questions. COMMON BRAND NAME(S): Fulphila, Fylnetra, Neulasta, Nyvepria, Stimufend, UDENYCA, Ziextenzo What should I tell my care team before I take this medication? They need to know if you have any of these conditions: Kidney disease Latex allergy Ongoing radiation therapy Sickle cell disease Skin reactions to acrylic adhesives (On-Body Injector only) An unusual or allergic reaction to pegfilgrastim, filgrastim, other medications, foods, dyes, or preservatives Pregnant or trying to get  pregnant Breast-feeding How should I use this medication? This medication is for injection under the skin. If you get this medication at home, you will be taught how to prepare and give the pre-filled syringe or how to use the On-body Injector. Refer to the patient Instructions for Use for detailed instructions. Use exactly as directed. Tell your care team immediately if you suspect that the On-body Injector may not have performed as intended or if you suspect the use of the On-body Injector resulted in a missed or partial dose. It is important that you put your used needles and syringes in a special sharps container. Do not put them in a trash can. If you do not have a sharps container, call your pharmacist or care team to get one. Talk to your care team about the use of this medication in children. While this medication may be prescribed for selected conditions, precautions do apply. Overdosage: If you think you have taken too much of this medicine contact a poison control center or emergency room at once. NOTE: This medicine is only for you. Do not share this medicine with others. What if I miss a dose? It is important not to miss your dose. Call your care team if you miss your dose. If you miss a dose due to an On-body Injector failure or leakage, a new dose should be administered as soon as possible using a single prefilled syringe for manual use. What may interact with this medication? Interactions have not been studied. This list may not describe all possible interactions. Give your health care provider a list of all the medicines, herbs, non-prescription drugs, or dietary supplements you use. Also tell   them if you smoke, drink alcohol, or use illegal drugs. Some items may interact with your medicine. What should I watch for while using this medication? Your condition will be monitored carefully while you are receiving this medication. You may need blood work done while you are taking this  medication. Talk to your care team about your risk of cancer. You may be more at risk for certain types of cancer if you take this medication. If you are going to need a MRI, CT scan, or other procedure, tell your care team that you are using this medication (On-Body Injector only). What side effects may I notice from receiving this medication? Side effects that you should report to your care team as soon as possible: Allergic reactions--skin rash, itching, hives, swelling of the face, lips, tongue, or throat Capillary leak syndrome--stomach or muscle pain, unusual weakness or fatigue, feeling faint or lightheaded, decrease in the amount of urine, swelling of the ankles, hands, or feet, trouble breathing High white blood cell level--fever, fatigue, trouble breathing, night sweats, change in vision, weight loss Inflammation of the aorta--fever, fatigue, back, chest, or stomach pain, severe headache Kidney injury (glomerulonephritis)--decrease in the amount of urine, red or dark [place] urine, foamy or bubbly urine, swelling of the ankles, hands, or feet Shortness of breath or trouble breathing Spleen injury--pain in upper left stomach or shoulder Unusual bruising or bleeding Side effects that usually do not require medical attention (report to your care team if they continue or are bothersome): Bone pain Pain in the hands or feet This list may not describe all possible side effects. Call your doctor for medical advice about side effects. You may report side effects to FDA at 1-800-FDA-1088. Where should I keep my medication? Keep out of the reach of children. If you are using this medication at home, you will be instructed on how to store it. Throw away any unused medication after the expiration date on the label. NOTE: This sheet is a summary. It may not cover all possible information. If you have questions about this medicine, talk to your doctor, pharmacist, or health care provider.  2023  Elsevier/Gold Standard (2021-02-27 00:00:00)        To help prevent nausea and vomiting after your treatment, we encourage you to take your nausea medication as directed.  BELOW ARE SYMPTOMS THAT SHOULD BE REPORTED IMMEDIATELY: *FEVER GREATER THAN 100.4 F (38 C) OR HIGHER *CHILLS OR SWEATING *NAUSEA AND VOMITING THAT IS NOT CONTROLLED WITH YOUR NAUSEA MEDICATION *UNUSUAL SHORTNESS OF BREATH *UNUSUAL BRUISING OR BLEEDING *URINARY PROBLEMS (pain or burning when urinating, or frequent urination) *BOWEL PROBLEMS (unusual diarrhea, constipation, pain near the anus) TENDERNESS IN MOUTH AND THROAT WITH OR WITHOUT PRESENCE OF ULCERS (sore throat, sores in mouth, or a toothache) UNUSUAL RASH, SWELLING OR PAIN  UNUSUAL VAGINAL DISCHARGE OR ITCHING   Items with * indicate a potential emergency and should be followed up as soon as possible or go to the Emergency Department if any problems should occur.  Please show the CHEMOTHERAPY ALERT CARD or IMMUNOTHERAPY ALERT CARD at check-in to the Emergency Department and triage nurse.  Should you have questions after your visit or need to cancel or reschedule your appointment, please contact [facility] [phone]  and follow the prompts.  Office hours are 8:00 a.m. to 4:30 p.m. Monday - Friday. Please note that voicemails left after 4:00 p.m. may not be returned until the following business day.  We are closed weekends and major   holidays. You have access to a nurse at all times for urgent questions. Please call the main number to the clinic [phone] and follow the prompts.  For any non-urgent questions, you may also contact your provider using MyChart. We now offer e-Visits for anyone 18 and older to request care online for non-urgent symptoms. For details visit [email or web address].   Also download the MyChart app! Go to the app store, search "MyChart", open the app, select [facility], and log in with your MyChart username and  password.   

## 2022-09-09 NOTE — Progress Notes (Signed)
Patients port flushed without difficulty.  Home infusion 5FU pump disconnected with no difficulty.  Good blood return noted with no bruising or swelling noted at site.  Band aid applied.  Patient tolerated Fulphila injection with no complaints voiced.  Site clean and dry with no bruising or swelling noted.  No complaints of pain.  Discharged with vital signs stable and no signs or symptoms of distress noted.

## 2022-09-17 ENCOUNTER — Emergency Department (HOSPITAL_COMMUNITY)
Admission: EM | Admit: 2022-09-17 | Discharge: 2022-09-17 | Disposition: A | Discharge status: Home / Self Care | Payer: 59 | Attending: Emergency Medicine | Admitting: Emergency Medicine

## 2022-09-17 ENCOUNTER — Other Ambulatory Visit: Payer: Self-pay

## 2022-09-17 ENCOUNTER — Encounter (HOSPITAL_COMMUNITY): Payer: Self-pay | Admitting: *Deleted

## 2022-09-17 ENCOUNTER — Telehealth: Payer: Self-pay | Admitting: *Deleted

## 2022-09-17 ENCOUNTER — Emergency Department (HOSPITAL_COMMUNITY): Payer: 59

## 2022-09-17 DIAGNOSIS — R0602 Shortness of breath: Secondary | ICD-10-CM | POA: Diagnosis not present

## 2022-09-17 DIAGNOSIS — Z0389 Encounter for observation for other suspected diseases and conditions ruled out: Secondary | ICD-10-CM | POA: Diagnosis not present

## 2022-09-17 DIAGNOSIS — Z91040 Latex allergy status: Secondary | ICD-10-CM | POA: Insufficient documentation

## 2022-09-17 DIAGNOSIS — C189 Malignant neoplasm of colon, unspecified: Secondary | ICD-10-CM | POA: Insufficient documentation

## 2022-09-17 DIAGNOSIS — Z1152 Encounter for screening for COVID-19: Secondary | ICD-10-CM | POA: Insufficient documentation

## 2022-09-17 DIAGNOSIS — R509 Fever, unspecified: Secondary | ICD-10-CM | POA: Diagnosis present

## 2022-09-17 LAB — CBC WITH DIFFERENTIAL/PLATELET
Abs Immature Granulocytes: 0.1 10*3/uL — ABNORMAL HIGH (ref 0.00–0.07)
Basophils Absolute: 0.1 10*3/uL (ref 0.0–0.1)
Basophils Relative: 1 %
Eosinophils Absolute: 0.1 10*3/uL (ref 0.0–0.5)
Eosinophils Relative: 1 %
HCT: 37.1 % — ABNORMAL LOW (ref 39.0–52.0)
Hemoglobin: 13.3 g/dL (ref 13.0–17.0)
Immature Granulocytes: 1 %
Lymphocytes Relative: 11 %
Lymphs Abs: 1.5 10*3/uL (ref 0.7–4.0)
MCH: 33.3 pg (ref 26.0–34.0)
MCHC: 35.8 g/dL (ref 30.0–36.0)
MCV: 93 fL (ref 80.0–100.0)
Monocytes Absolute: 2 10*3/uL — ABNORMAL HIGH (ref 0.1–1.0)
Monocytes Relative: 15 %
Neutro Abs: 9.5 10*3/uL — ABNORMAL HIGH (ref 1.7–7.7)
Neutrophils Relative %: 71 %
Platelets: 118 10*3/uL — ABNORMAL LOW (ref 150–400)
RBC: 3.99 MIL/uL — ABNORMAL LOW (ref 4.22–5.81)
RDW: 16.4 % — ABNORMAL HIGH (ref 11.5–15.5)
WBC: 13.3 10*3/uL — ABNORMAL HIGH (ref 4.0–10.5)
nRBC: 0 % (ref 0.0–0.2)

## 2022-09-17 LAB — COMPREHENSIVE METABOLIC PANEL
ALT: 37 U/L (ref 0–44)
AST: 37 U/L (ref 15–41)
Albumin: 3.6 g/dL (ref 3.5–5.0)
Alkaline Phosphatase: 206 U/L — ABNORMAL HIGH (ref 38–126)
Anion gap: 8 (ref 5–15)
BUN: 11 mg/dL (ref 6–20)
CO2: 20 mmol/L — ABNORMAL LOW (ref 22–32)
Calcium: 8.9 mg/dL (ref 8.9–10.3)
Chloride: 105 mmol/L (ref 98–111)
Creatinine, Ser: 0.88 mg/dL (ref 0.61–1.24)
GFR, Estimated: >60 mL/min (ref >60)
Glucose, Bld: 103 mg/dL — ABNORMAL HIGH (ref 70–99)
Potassium: 3.9 mmol/L (ref 3.5–5.1)
Sodium: 133 mmol/L — ABNORMAL LOW (ref 135–145)
Total Bilirubin: 0.8 mg/dL (ref 0.3–1.2)
Total Protein: 7.1 g/dL (ref 6.5–8.1)

## 2022-09-17 LAB — PROTIME-INR
INR: 1.1 (ref 0.8–1.2)
Prothrombin Time: 14.2 seconds (ref 11.4–15.2)

## 2022-09-17 LAB — RESP PANEL BY RT-PCR (RSV, FLU A&B, COVID)  RVPGX2
Influenza A by PCR: NEGATIVE
Influenza B by PCR: NEGATIVE
Resp Syncytial Virus by PCR: NEGATIVE
SARS Coronavirus 2 by RT PCR: NEGATIVE

## 2022-09-17 LAB — LACTIC ACID, PLASMA
Lactic Acid, Venous: 1.4 mmol/L (ref 0.5–1.9)
Lactic Acid, Venous: 1.3 mmol/L (ref 0.5–1.9)

## 2022-09-17 LAB — APTT: aPTT: 26 seconds (ref 24–36)

## 2022-09-17 NOTE — Discharge Instructions (Signed)
I have spoken with your cancer doctor, the Dr. Delton Coombes, he wants you to go home and monitor your symptoms.  If you get worse with more shortness of breath fever chest pain or any other symptoms please come back to the ER immediately.  If you start to get more shortness of breath or cough please do a COVID test at home

## 2022-09-17 NOTE — Telephone Encounter (Addendum)
Patient called to report fever, sob and chills.  No other associated symptoms at this time, however was advised to go to the ER to rule out neutropenia or other infectious process.  Last treatment was on 09/09/22.  Has been exposed to illness in the home and at work. Verbalized understanding.

## 2022-09-17 NOTE — ED Provider Notes (Signed)
[facility] Provider Note   CSN: [id number] Arrival date & time: 09/17/22  1026     History  Chief Complaint  Patient presents with   Fever    Troy Dillon is a 52 y.o. male.   Fever    Patient is currently getting chemotherapy for colon cancer, he last received this on Monday, he was diagnosed several months ago by colonoscopy, he reports he was in his usual state of health until overnight when he started having chills, woke up and measured his temperature at 100.8, has felt a little short of breath with exertion the last couple of days.  He denies swelling of the legs, denies nausea or vomiting, denies diarrhea, appetite has been decreased and starting chemotherapy but no significant changes.  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 Authorizing Provider  dextrose 5 % SOLN 1,000 mL with fluorouracil 5 GM/100ML SOLN Inject into the vein over 48 hr. Every 14 days   Yes [provider]  FLUOROURACIL IV Inject into the vein every 14 (fourteen) days.   Yes [provider]  ibuprofen (ADVIL) 200 MG tablet Take 200 mg by mouth every 6 (six) hours as needed for moderate pain.   Yes [provider]  LEUCOVORIN CALCIUM IV Inject into the vein every 14 (fourteen) days. 06/29/22  Yes [provider]  lidocaine-prilocaine (EMLA) cream Apply a small amount to port a cath site and cover with plastic wrap one hour prior to infusion appointments 06/25/22  Yes Derek Jack, MD  megestrol (MEGACE) 400 MG/10ML suspension Take 10 mLs (400 mg total) by mouth 2 (two) times daily. 09/07/22  Yes Derek Jack, MD  Multiple Vitamin  (MULTIVITAMIN) tablet Take 1 tablet by mouth daily.   Yes [provider]  ondansetron (ZOFRAN) 8 MG tablet Take 1 tablet (8 mg total) by mouth every 8 (eight) hours as needed. 08/10/22  Yes Derek Jack, MD  OXALIPLATIN IV Inject into the vein every 14 (fourteen) days. 06/29/22  Yes [provider]  prochlorperazine (COMPAZINE) 10 MG tablet Take 1 tablet (10 mg total) by mouth every 6 (six) hours as needed for nausea or vomiting. 06/25/22  Yes Derek Jack, MD  promethazine (PHENERGAN) 25 MG suppository Place 1 suppository (25 mg total) rectally every 6 (six) hours as needed for nausea or vomiting. 07/13/22  Yes Derek Jack, MD  Salicylic Acid, Acne, (SALICYLIC ACID EX) Apply 1 Application topically daily as needed (acne).   Yes [provider]  sodium chloride (OCEAN) 0.65 % SOLN nasal spray Place 1 spray into both nostrils as needed for congestion.   Yes [provider]      Allergies    Hydrocodone and Latex    Review of Systems   Review of Systems  Constitutional:  Positive for fever.  All other systems reviewed and are negative.   Physical Exam Updated Vital Signs BP 124/84   Pulse 74   Temp 98.7 F (37.1 C) (Oral)   Resp 19   Ht 1.778 m ('5\' 10"'$ )   Wt 79.8 kg   SpO2 99%   BMI 25.25 kg/m  Physical Exam Vitals and nursing note  reviewed.  Constitutional:      General: He is not in acute distress.    Appearance: He is well-developed.  HENT:     Head: Normocephalic and atraumatic.     Mouth/Throat:     Pharynx: No oropharyngeal exudate.  Eyes:     General: No scleral icterus.       Right eye: No discharge.        Left eye: No discharge.     Conjunctiva/sclera: Conjunctivae normal.     Pupils: Pupils are equal, round, and reactive to light.  Neck:     Thyroid: No thyromegaly.     Vascular: No JVD.  Cardiovascular:     Rate and Rhythm: Normal rate and regular rhythm.     Heart sounds: Normal heart sounds. No  murmur heard.    No friction rub. No gallop.  Pulmonary:     Effort: Pulmonary effort is normal. No respiratory distress.     Breath sounds: Normal breath sounds. No wheezing or rales.  Abdominal:     General: Bowel sounds are normal. There is no distension.     Palpations: Abdomen is soft. There is no mass.     Tenderness: There is no abdominal tenderness.  Musculoskeletal:        General: No tenderness. Normal range of motion.     Cervical back: Normal range of motion and neck supple.     Right lower leg: No edema.     Left lower leg: No edema.  Lymphadenopathy:     Cervical: No cervical adenopathy.  Skin:    General: Skin is warm and dry.     Findings: No erythema or rash.  Neurological:     Mental Status: He is alert.     Coordination: Coordination normal.  Psychiatric:        Behavior: Behavior normal.     ED Results / Procedures / Treatments   Labs (all labs ordered are listed, but only abnormal results are displayed) Labs Reviewed  COMPREHENSIVE METABOLIC PANEL - Abnormal; Notable for the following components:      Result Value   Sodium 133 (*)    CO2 20 (*)    Glucose, Bld 103 (*)    Alkaline Phosphatase 206 (*)    All other components within normal limits  CBC WITH DIFFERENTIAL/PLATELET - Abnormal; Notable for the following components:   WBC 13.3 (*)    RBC 3.99 (*)    HCT 37.1 (*)    RDW 16.4 (*)    Platelets 118 (*)    Neutro Abs 9.5 (*)    Monocytes Absolute 2.0 (*)    Abs Immature Granulocytes 0.10 (*)    All other components within normal limits  CULTURE, BLOOD (ROUTINE X 2)  CULTURE, BLOOD (ROUTINE X 2)  RESP PANEL BY RT-PCR (RSV, FLU A&B, COVID)  [id number]  URINE CULTURE  LACTIC ACID, PLASMA  LACTIC ACID, PLASMA  PROTIME-INR  APTT    EKG EKG Interpretation  Date/Time:  Thursday September 17 2022 12:31:36 EST Ventricular Rate:  75 PR Interval:  116 QRS Duration: 90 QT Interval:  378 QTC Calculation: 423 R Axis:   58 Text  Interpretation: Sinus rhythm Borderline short PR interval Consider inferior infarct Confirmed by Noemi Chapel [phone]) on 09/17/2022 1:38:17 PM  Radiology DG Chest Port 1 View  Result Date: 09/17/2022 CLINICAL DATA:  Questionable sepsis - evaluate for abnormality EXAM: PORTABLE CHEST - 1 VIEW COMPARISON:  06/08/2022 FINDINGS: Cardiac silhouette is unremarkable. No pneumothorax  or pleural effusion. The lungs are clear. The visualized skeletal structures are unremarkable. Right-sided Port-A-Cath tip mid to distal SVC. IMPRESSION: No acute cardiopulmonary process. Electronically Signed   By: Sammie Bench M.D.   On: 09/17/2022 11:57    Procedures Procedures    Medications Ordered in ED Medications - No data to display  ED Course/ Medical Decision Making/ A&P                             Medical Decision Making Amount and/or Complexity of Data Reviewed Labs: ordered. Radiology: ordered. ECG/medicine tests: ordered.   This patient presents to the ED for concern of neutropenia, fever, this involves an extensive number of treatment options, and is a complaint that carries with it a high risk of complications and morbidity.  The differential diagnosis includes sepsis though this seems less likely given normal vital signs, he is not hypoxic tachycardic or febrile, he may be neutropenic which is of concern as well, this may just be viral   Co morbidities that complicate the patient evaluation  Currently getting chemotherapy for colon cancer   Additional history obtained:  Additional history obtained from oncology External records from outside source obtained and reviewed including infusion through the oncology center, no recent admissions to the hospital,   Lab Tests:  I Ordered, and personally interpreted labs.  The pertinent results include: Unremarkable CBC metabolic panel as well as COVID and flu testing   Imaging Studies ordered:  I ordered imaging studies including chest  x-ray I independently visualized and interpreted imaging which showed no acute findings I agree with the radiologist interpretation   Cardiac Monitoring: / EKG:  The patient was maintained on a cardiac monitor.  I personally viewed and interpreted the cardiac monitored which showed an underlying rhythm of: Normal sinus rhythm, no arrhythmias or tachycardia   Consultations Obtained:  I requested consultation with the oncologist Dr. Delton Coombes,  and discussed lab and imaging findings as well as pertinent plan - they recommend: Discharging the patient home, close follow-up in the office   Problem List / ED Course / Critical interventions / Medication management  The patient did not have a fever tachycardia leukocytosis or any other signs of severe illness here.  He is not leukopenic or neutropenic as well I ordered medication including antibiotics for potential infection but after discussion with oncology they recommend no antibiotics at this time Reevaluation of the patient after these medicines showed that the patient stable, no symptoms at the time of discharge I have reviewed the patients home medicines and have made adjustments as needed   Social Determinants of Health:  Ongoing cancer treatment with chemotherapy   Test / Admission - Considered:  Considered admission and antibiotics but the patient appears well with no signs of infection at this time         Final Clinical Impression(s) / ED Diagnoses Final diagnoses:  Shortness of breath    Rx / DC Orders ED Discharge Orders     None         Noemi Chapel, MD 09/18/22 [phone]

## 2022-09-17 NOTE — ED Triage Notes (Signed)
Pt states he woke up this am with temp of 100.8; pt c/o sob and had chills last night  Pt had a chemo treatment last Monday and is due for another treatment this coming monday

## 2022-09-18 LAB — URINE CULTURE: Culture: NO GROWTH

## 2022-09-21 ENCOUNTER — Inpatient Hospital Stay (HOSPITAL_BASED_OUTPATIENT_CLINIC_OR_DEPARTMENT_OTHER): Payer: 59 | Admitting: Hematology

## 2022-09-21 ENCOUNTER — Inpatient Hospital Stay: Payer: 59

## 2022-09-21 VITALS — BP 128/89 | HR 75 | Temp 97.2°F | Resp 18

## 2022-09-21 DIAGNOSIS — Z95828 Presence of other vascular implants and grafts: Secondary | ICD-10-CM

## 2022-09-21 DIAGNOSIS — Z5111 Encounter for antineoplastic chemotherapy: Secondary | ICD-10-CM | POA: Diagnosis not present

## 2022-09-21 DIAGNOSIS — C20 Malignant neoplasm of rectum: Secondary | ICD-10-CM

## 2022-09-21 DIAGNOSIS — Z5189 Encounter for other specified aftercare: Secondary | ICD-10-CM | POA: Diagnosis not present

## 2022-09-21 DIAGNOSIS — Z79899 Other long term (current) drug therapy: Secondary | ICD-10-CM | POA: Diagnosis not present

## 2022-09-21 LAB — CBC WITH DIFFERENTIAL/PLATELET
Abs Immature Granulocytes: 0.07 10*3/uL (ref 0.00–0.07)
Basophils Absolute: 0.1 10*3/uL (ref 0.0–0.1)
Basophils Relative: 1 %
Eosinophils Absolute: 0.1 10*3/uL (ref 0.0–0.5)
Eosinophils Relative: 2 %
HCT: 38.6 % — ABNORMAL LOW (ref 39.0–52.0)
Hemoglobin: 13.8 g/dL (ref 13.0–17.0)
Immature Granulocytes: 1 %
Lymphocytes Relative: 23 %
Lymphs Abs: 1.4 10*3/uL (ref 0.7–4.0)
MCH: 33.5 pg (ref 26.0–34.0)
MCHC: 35.8 g/dL (ref 30.0–36.0)
MCV: 93.7 fL (ref 80.0–100.0)
Monocytes Absolute: 0.7 10*3/uL (ref 0.1–1.0)
Monocytes Relative: 12 %
Neutro Abs: 3.5 10*3/uL (ref 1.7–7.7)
Neutrophils Relative %: 61 %
Platelets: 137 10*3/uL — ABNORMAL LOW (ref 150–400)
RBC: 4.12 MIL/uL — ABNORMAL LOW (ref 4.22–5.81)
RDW: 16 % — ABNORMAL HIGH (ref 11.5–15.5)
WBC: 5.8 10*3/uL (ref 4.0–10.5)
nRBC: 0 % (ref 0.0–0.2)

## 2022-09-21 LAB — COMPREHENSIVE METABOLIC PANEL
ALT: 35 U/L (ref 0–44)
AST: 36 U/L (ref 15–41)
Albumin: 3.7 g/dL (ref 3.5–5.0)
Alkaline Phosphatase: 179 U/L — ABNORMAL HIGH (ref 38–126)
Anion gap: 7 (ref 5–15)
BUN: 15 mg/dL (ref 6–20)
CO2: 21 mmol/L — ABNORMAL LOW (ref 22–32)
Calcium: 8.7 mg/dL — ABNORMAL LOW (ref 8.9–10.3)
Chloride: 107 mmol/L (ref 98–111)
Creatinine, Ser: 0.96 mg/dL (ref 0.61–1.24)
GFR, Estimated: >60 mL/min (ref >60)
Glucose, Bld: 142 mg/dL — ABNORMAL HIGH (ref 70–99)
Potassium: 3.7 mmol/L (ref 3.5–5.1)
Sodium: 135 mmol/L (ref 135–145)
Total Bilirubin: 0.7 mg/dL (ref 0.3–1.2)
Total Protein: 7.2 g/dL (ref 6.5–8.1)

## 2022-09-21 LAB — MAGNESIUM: Magnesium: 2.1 mg/dL (ref 1.7–2.4)

## 2022-09-21 MED ORDER — FLUOROURACIL CHEMO INJECTION 2.5 GM/50ML
400.0000 mg/m2 | Freq: Once | INTRAVENOUS | Status: AC
  Administered 2022-09-21: 800 mg via INTRAVENOUS
  Filled 2022-09-21: qty 16

## 2022-09-21 MED ORDER — SODIUM CHLORIDE 0.9 % IV SOLN
10.0000 mg | Freq: Once | INTRAVENOUS | Status: AC
  Administered 2022-09-21: 10 mg via INTRAVENOUS
  Filled 2022-09-21: qty 10

## 2022-09-21 MED ORDER — SODIUM CHLORIDE 0.9% FLUSH
10.0000 mL | Freq: Once | INTRAVENOUS | Status: AC
  Administered 2022-09-21: 10 mL via INTRAVENOUS

## 2022-09-21 MED ORDER — SODIUM CHLORIDE 0.9 % IV SOLN
150.0000 mg | Freq: Once | INTRAVENOUS | Status: AC
  Administered 2022-09-21: 150 mg via INTRAVENOUS
  Filled 2022-09-21: qty 150

## 2022-09-21 MED ORDER — LEUCOVORIN CALCIUM INJECTION 350 MG
400.0000 mg/m2 | Freq: Once | INTRAVENOUS | Status: AC
  Administered 2022-09-21: 800 mg via INTRAVENOUS
  Filled 2022-09-21: qty 40

## 2022-09-21 MED ORDER — DEXTROSE 5 % IV SOLN: Freq: Once | INTRAVENOUS | Status: AC

## 2022-09-21 MED ORDER — SODIUM CHLORIDE 0.9 % IV SOLN
2500.0000 mg/m2 | INTRAVENOUS | Status: DC
  Administered 2022-09-21: 5000 mg via INTRAVENOUS
  Filled 2022-09-21: qty 100

## 2022-09-21 MED ORDER — PALONOSETRON HCL INJECTION 0.25 MG/5ML
0.2500 mg | Freq: Once | INTRAVENOUS | Status: AC
  Administered 2022-09-21: 0.25 mg via INTRAVENOUS
  Filled 2022-09-21: qty 5

## 2022-09-21 MED ORDER — OXALIPLATIN CHEMO INJECTION 100 MG/20ML
68.0000 mg/m2 | Freq: Once | INTRAVENOUS | Status: AC
  Administered 2022-09-21: 135 mg via INTRAVENOUS
  Filled 2022-09-21: qty 27

## 2022-09-21 NOTE — Patient Instructions (Signed)
[facility] Discharge Instructions   You were seen and examined today by Dr. Delton Coombes.  He reviewed the results of your lab work which are normal/stable.   We will proceed with your treatment today. You have treatment today and in 2 weeks. We will repeat an MRI after your eighth treatment.   Return as scheduled.    Thank you for choosing [facility] to provide your oncology and hematology care.  To afford each patient quality time with our provider, please arrive at least 15 minutes before your scheduled appointment time.   If you have a lab appointment with the [facility] please come in thru the Main Entrance and check in at the main information desk.  You need to re-schedule your appointment should you arrive 10 or more minutes late.  We strive to give you quality time with our providers, and arriving late affects you and other patients whose appointments are after yours.  Also, if you no show three or more times for appointments you may be dismissed from the clinic at the providers discretion.     Again, thank you for choosing [facility].  Our hope is that these requests will decrease the amount of time that you wait before being seen by our physicians.       _____________________________________________________________  Should you have questions after your visit to [facility], please contact our office at [phone] and follow the prompts.  Our office hours are 8:00 a.m. and 4:30 p.m. Monday - Friday.  Please note that voicemails left after 4:00 p.m. may not be returned until the following business day.  We are closed weekends and major holidays.  You do have access to a nurse 24-7, just call the main number to the clinic [phone] and do not press any options, hold on the line and a nurse will answer the phone.    For prescription refill requests, have your pharmacy contact  our office and allow 72 hours.    Due to Covid, you will need to wear a mask upon entering the hospital. If you do not have a mask, a mask will be given to you at the Main Entrance upon arrival. For doctor visits, patients may have 1 support person age 62 or older with them. For treatment visits, patients can not have anyone with them due to social distancing guidelines and our immunocompromised population.

## 2022-09-21 NOTE — Patient Instructions (Signed)
[facility]  Discharge Instructions: Thank you for choosing [facility] to provide your oncology and hematology care.  If you have a lab appointment with the Cancer Center, please come in thru the Main Entrance and check in at the main information desk.  Wear comfortable clothing and clothing appropriate for easy access to any Portacath or PICC line.   We strive to give you quality time with your provider. You may need to reschedule your appointment if you arrive late (15 or more minutes).  Arriving late affects you and other patients whose appointments are after yours.  Also, if you miss three or more appointments without notifying the office, you may be dismissed from the clinic at the provider's discretion.      For prescription refill requests, have your pharmacy contact our office and allow 72 hours for refills to be completed.    Today you received the following chemotherapy and/or immunotherapy agents Folfox   To help prevent nausea and vomiting after your treatment, we encourage you to take your nausea medication as directed.   Oxaliplatin Injection What is this medication? OXALIPLATIN (ox AL i PLA tin) treats some types of cancer. It works by slowing down the growth of cancer cells. This medicine may be used for other purposes; ask your health care provider or pharmacist if you have questions. COMMON BRAND NAME(S): Eloxatin What should I tell my care team before I take this medication? They need to know if you have any of these conditions: Heart disease History of irregular heartbeat or rhythm Liver disease Low blood cell levels (white cells, red cells, and platelets) Lung or breathing disease, such as asthma Take medications that treat or prevent blood clots Tingling of the fingers, toes, or other nerve disorder An unusual or allergic reaction to oxaliplatin, other medications, foods, dyes, or preservatives If you or your partner are  pregnant or trying to get pregnant Breast-feeding How should I use this medication? This medication is injected into a vein. It is given by your care team in a hospital or clinic setting. Talk to your care team about the use of this medication in children. Special care may be needed. Overdosage: If you think you have taken too much of this medicine contact a poison control center or emergency room at once. NOTE: This medicine is only for you. Do not share this medicine with others. What if I miss a dose? Keep appointments for follow-up doses. It is important not to miss a dose. Call your care team if you are unable to keep an appointment. What may interact with this medication? Do not take this medication with any of the following: Cisapride Dronedarone Pimozide Thioridazine This medication may also interact with the following: Aspirin and aspirin-like medications Certain medications that treat or prevent blood clots, such as warfarin, apixaban, dabigatran, and rivaroxaban Cisplatin Cyclosporine Diuretics Medications for infection, such as acyclovir, adefovir, amphotericin B, bacitracin, cidofovir, foscarnet, ganciclovir, gentamicin, pentamidine, vancomycin NSAIDs, medications for pain and inflammation, such as ibuprofen or naproxen Other medications that cause heart rhythm changes Pamidronate Zoledronic acid This list may not describe all possible interactions. Give your health care provider a list of all the medicines, herbs, non-prescription drugs, or dietary supplements you use. Also tell them if you smoke, drink alcohol, or use illegal drugs. Some items may interact with your medicine. What should I watch for while using this medication? Your condition will be monitored carefully while you are receiving this medication. You may   need blood work while taking this medication. This medication may make you feel generally unwell. This is not uncommon as chemotherapy can affect healthy  cells as well as cancer cells. Report any side effects. Continue your course of treatment even though you feel ill unless your care team tells you to stop. This medication may increase your risk of getting an infection. Call your care team for advice if you get a fever, chills, sore throat, or other symptoms of a cold or flu. Do not treat yourself. Try to avoid being around people who are sick. Avoid taking medications that contain aspirin, acetaminophen, ibuprofen, naproxen, or ketoprofen unless instructed by your care team. These medications may hide a fever. Be careful brushing or flossing your teeth or using a toothpick because you may get an infection or bleed more easily. If you have any dental work done, tell your dentist you are receiving this medication. This medication can make you more sensitive to cold. Do not drink cold drinks or use ice. Cover exposed skin before coming in contact with cold temperatures or cold objects. When out in cold weather wear warm clothing and cover your mouth and nose to warm the air that goes into your lungs. Tell your care team if you get sensitive to the cold. Talk to your care team if you or your partner are pregnant or think either of you might be pregnant. This medication can cause serious birth defects if taken during pregnancy and for 9 months after the last dose. A negative pregnancy test is required before starting this medication. A reliable form of contraception is recommended while taking this medication and for 9 months after the last dose. Talk to your care team about effective forms of contraception. Do not father a child while taking this medication and for 6 months after the last dose. Use a condom while having sex during this time period. Do not breastfeed while taking this medication and for 3 months after the last dose. This medication may cause infertility. Talk to your care team if you are concerned about your fertility. What side effects may I  notice from receiving this medication? Side effects that you should report to your care team as soon as possible: Allergic reactions--skin rash, itching, hives, swelling of the face, lips, tongue, or throat Bleeding--bloody or black, tar-like stools, vomiting blood or brown material that looks like coffee grounds, red or dark brown urine, small red or purple spots on skin, unusual bruising or bleeding Dry cough, shortness of breath or trouble breathing Heart rhythm changes--fast or irregular heartbeat, dizziness, feeling faint or lightheaded, chest pain, trouble breathing Infection--fever, chills, cough, sore throat, wounds that don't heal, pain or trouble when passing urine, general feeling of discomfort or being unwell Liver injury--right upper belly pain, loss of appetite, nausea, light-colored stool, dark yellow or brown urine, yellowing skin or eyes, unusual weakness or fatigue Low red blood cell level--unusual weakness or fatigue, dizziness, headache, trouble breathing Muscle injury--unusual weakness or fatigue, muscle pain, dark yellow or brown urine, decrease in amount of urine Pain, tingling, or numbness in the hands or feet Sudden and severe headache, confusion, change in vision, seizures, which may be signs of posterior reversible encephalopathy syndrome (PRES) Unusual bruising or bleeding Side effects that usually do not require medical attention (report to your care team if they continue or are bothersome): Diarrhea Nausea Pain, redness, or swelling with sores inside the mouth or throat Unusual weakness or fatigue Vomiting This list may not describe   all possible side effects. Call your doctor for medical advice about side effects. You may report side effects to FDA at 1-800-FDA-1088. Where should I keep my medication? This medication is given in a hospital or clinic. It will not be stored at home. NOTE: This sheet is a summary. It may not cover all possible information. If you have  questions about this medicine, talk to your doctor, pharmacist, or health care provider.  2023 Elsevier/Gold Standard (2007-10-01 00:00:00)  Leucovorin Injection What is this medication? LEUCOVORIN (loo koe VOR in) prevents side effects from certain medications, such as methotrexate. It works by increasing folate levels. This helps protect healthy cells in your body. It may also be used to treat anemia caused by low levels of folate. It can also be used with fluorouracil, a type of chemotherapy, to treat colorectal cancer. It works by increasing the effects of fluorouracil in the body. This medicine may be used for other purposes; ask your health care provider or pharmacist if you have questions. What should I tell my care team before I take this medication? They need to know if you have any of these conditions: Anemia from low levels of vitamin B12 in the blood An unusual or allergic reaction to leucovorin, folic acid, other medications, foods, dyes, or preservatives Pregnant or trying to get pregnant Breastfeeding How should I use this medication? This medication is injected into a vein or a muscle. It is given by your care team in a hospital or clinic setting. Talk to your care team about the use of this medication in children. Special care may be needed. Overdosage: If you think you have taken too much of this medicine contact a poison control center or emergency room at once. NOTE: This medicine is only for you. Do not share this medicine with others. What if I miss a dose? Keep appointments for follow-up doses. It is important not to miss your dose. Call your care team if you are unable to keep an appointment. What may interact with this medication? Capecitabine Fluorouracil Phenobarbital Phenytoin Primidone Trimethoprim;sulfamethoxazole This list may not describe all possible interactions. Give your health care provider a list of all the medicines, herbs, non-prescription drugs, or  dietary supplements you use. Also tell them if you smoke, drink alcohol, or use illegal drugs. Some items may interact with your medicine. What should I watch for while using this medication? Your condition will be monitored carefully while you are receiving this medication. This medication may increase the side effects of 5-fluorouracil. Tell your care team if you have diarrhea or mouth sores that do not get better or that get worse. What side effects may I notice from receiving this medication? Side effects that you should report to your care team as soon as possible: Allergic reactions--skin rash, itching, hives, swelling of the face, lips, tongue, or throat This list may not describe all possible side effects. Call your doctor for medical advice about side effects. You may report side effects to FDA at 1-800-FDA-1088. Where should I keep my medication? This medication is given in a hospital or clinic. It will not be stored at home. NOTE: This sheet is a summary. It may not cover all possible information. If you have questions about this medicine, talk to your doctor, pharmacist, or health care provider.  2023 Elsevier/Gold Standard (2022-01-13 00:00:00)  Fluorouracil Injection What is this medication? FLUOROURACIL (flure oh YOOR a sil) treats some types of cancer. It works by slowing down the growth of cancer   cells. This medicine may be used for other purposes; ask your health care provider or pharmacist if you have questions. COMMON BRAND NAME(S): Adrucil What should I tell my care team before I take this medication? They need to know if you have any of these conditions: Blood disorders Dihydropyrimidine dehydrogenase (DPD) deficiency Infection, such as chickenpox, cold sores, herpes Kidney disease Liver disease Poor nutrition Recent or ongoing radiation therapy An unusual or allergic reaction to fluorouracil, other medications, foods, dyes, or preservatives If you or your partner  are pregnant or trying to get pregnant Breast-feeding How should I use this medication? This medication is injected into a vein. It is administered by your care team in a hospital or clinic setting. Talk to your care team about the use of this medication in children. Special care may be needed. Overdosage: If you think you have taken too much of this medicine contact a poison control center or emergency room at once. NOTE: This medicine is only for you. Do not share this medicine with others. What if I miss a dose? Keep appointments for follow-up doses. It is important not to miss your dose. Call your care team if you are unable to keep an appointment. What may interact with this medication? Do not take this medication with any of the following: Live virus vaccines This medication may also interact with the following: Medications that treat or prevent blood clots, such as warfarin, enoxaparin, dalteparin This list may not describe all possible interactions. Give your health care provider a list of all the medicines, herbs, non-prescription drugs, or dietary supplements you use. Also tell them if you smoke, drink alcohol, or use illegal drugs. Some items may interact with your medicine. What should I watch for while using this medication? Your condition will be monitored carefully while you are receiving this medication. This medication may make you feel generally unwell. This is not uncommon as chemotherapy can affect healthy cells as well as cancer cells. Report any side effects. Continue your course of treatment even though you feel ill unless your care team tells you to stop. In some cases, you may be given additional medications to help with side effects. Follow all directions for their use. This medication may increase your risk of getting an infection. Call your care team for advice if you get a fever, chills, sore throat, or other symptoms of a cold or flu. Do not treat yourself. Try to  avoid being around people who are sick. This medication may increase your risk to bruise or bleed. Call your care team if you notice any unusual bleeding. Be careful brushing or flossing your teeth or using a toothpick because you may get an infection or bleed more easily. If you have any dental work done, tell your dentist you are receiving this medication. Avoid taking medications that contain aspirin, acetaminophen, ibuprofen, naproxen, or ketoprofen unless instructed by your care team. These medications may hide a fever. Do not treat diarrhea with over the counter products. Contact your care team if you have diarrhea that lasts more than 2 days or if it is severe and watery. This medication can make you more sensitive to the sun. Keep out of the sun. If you cannot avoid being in the sun, wear protective clothing and sunscreen. Do not use sun lamps, tanning beds, or tanning booths. Talk to your care team if you or your partner wish to become pregnant or think you might be pregnant. This medication can cause serious birth defects   if taken during pregnancy and for 3 months after the last dose. A reliable form of contraception is recommended while taking this medication and for 3 months after the last dose. Talk to your care team about effective forms of contraception. Do not father a child while taking this medication and for 3 months after the last dose. Use a condom while having sex during this time period. Do not breastfeed while taking this medication. This medication may cause infertility. Talk to your care team if you are concerned about your fertility. What side effects may I notice from receiving this medication? Side effects that you should report to your care team as soon as possible: Allergic reactions--skin rash, itching, hives, swelling of the face, lips, tongue, or throat Heart attack--pain or tightness in the chest, shoulders, arms, or jaw, nausea, shortness of breath, cold or clammy  skin, feeling faint or lightheaded Heart failure--shortness of breath, swelling of the ankles, feet, or hands, sudden weight gain, unusual weakness or fatigue Heart rhythm changes--fast or irregular heartbeat, dizziness, feeling faint or lightheaded, chest pain, trouble breathing High ammonia level--unusual weakness or fatigue, confusion, loss of appetite, nausea, vomiting, seizures Infection--fever, chills, cough, sore throat, wounds that don't heal, pain or trouble when passing urine, general feeling of discomfort or being unwell Low red blood cell level--unusual weakness or fatigue, dizziness, headache, trouble breathing Pain, tingling, or numbness in the hands or feet, muscle weakness, change in vision, confusion or trouble speaking, loss of balance or coordination, trouble walking, seizures Redness, swelling, and blistering of the skin over hands and feet Severe or prolonged diarrhea Unusual bruising or bleeding Side effects that usually do not require medical attention (report to your care team if they continue or are bothersome): Dry skin Headache Increased tears Nausea Pain, redness, or swelling with sores inside the mouth or throat Sensitivity to light Vomiting This list may not describe all possible side effects. Call your doctor for medical advice about side effects. You may report side effects to FDA at 1-800-FDA-1088. Where should I keep my medication? This medication is given in a hospital or clinic. It will not be stored at home. NOTE: This sheet is a summary. It may not cover all possible information. If you have questions about this medicine, talk to your doctor, pharmacist, or health care provider.  2023 Elsevier/Gold Standard (2021-12-09 00:00:00)    BELOW ARE SYMPTOMS THAT SHOULD BE REPORTED IMMEDIATELY: *FEVER GREATER THAN 100.4 F (38 C) OR HIGHER *CHILLS OR SWEATING *NAUSEA AND VOMITING THAT IS NOT CONTROLLED WITH YOUR NAUSEA MEDICATION *UNUSUAL SHORTNESS OF  BREATH *UNUSUAL BRUISING OR BLEEDING *URINARY PROBLEMS (pain or burning when urinating, or frequent urination) *BOWEL PROBLEMS (unusual diarrhea, constipation, pain near the anus) TENDERNESS IN MOUTH AND THROAT WITH OR WITHOUT PRESENCE OF ULCERS (sore throat, sores in mouth, or a toothache) UNUSUAL RASH, SWELLING OR PAIN  UNUSUAL VAGINAL DISCHARGE OR ITCHING   Items with * indicate a potential emergency and should be followed up as soon as possible or go to the Emergency Department if any problems should occur.  Please show the CHEMOTHERAPY ALERT CARD or IMMUNOTHERAPY ALERT CARD at check-in to the Emergency Department and triage nurse.  Should you have questions after your visit or need to cancel or reschedule your appointment, please contact [facility] [phone]  and follow the prompts.  Office hours are 8:00 a.m. to 4:30 p.m. Monday - Friday. Please note that voicemails left after 4:00 p.m. may not be returned until the following   business day.  We are closed weekends and major holidays. You have access to a nurse at all times for urgent questions. Please call the main number to the clinic [phone] and follow the prompts.  For any non-urgent questions, you may also contact your provider using MyChart. We now offer e-Visits for anyone 18 and older to request care online for non-urgent symptoms. For details visit [email or web address].   Also download the MyChart app! Go to the app store, search "MyChart", open the app, select [facility], and log in with your MyChart username and password.   

## 2022-09-21 NOTE — Progress Notes (Signed)
Troy Dillon, Troy Dillon [id number]   CLINIC:  Medical Oncology/Hematology  PCP:  Practice, [facility] [address] [name] [phone]   REASON FOR VISIT:  Follow-up for stage III rectal cancer  PRIOR THERAPY: None  NGS Results: MSI-stable, MMR-preserved  CURRENT THERAPY: FOLFOX  BRIEF ONCOLOGIC HISTORY:  Oncology History  Rectal carcinoma ([place])  06/17/2022 Initial Diagnosis   Rectal carcinoma ([place])   06/29/2022 -  Chemotherapy   Patient is on Treatment Plan : COLORECTAL FOLFOX q14d x 4 months      Genetic Testing   [facility] Common Cancer Panel+RNA was Negative. Report date is 08/29/2022.  The Common Hereditary Cancers Panel offered by [facility] includes sequencing and/or deletion duplication testing of the following 48 genes: APC, ATM, AXIN2, BAP1, BARD1, BMPR1A, BRCA1, BRCA2, BRIP1, CDH1, CDK4, CDKN2A (p14ARF and p16INK4a only), CHEK2, CTNNA1, DICER1, EPCAM (Deletion/duplication testing only), FH, GREM1 (promoter region duplication testing only), HOXB13, KIT, MBD4, MEN1, MLH1, MSH2, MSH3, MSH6, MUTYH, NF1, NHTL1, PALB2, PDGFRA, PMS2, POLD1, POLE, PTEN, RAD51C, RAD51D, SDHA (sequencing analysis only except exon 14), SDHB, SDHC, SDHD, SMAD4, SMARCA4. STK11, TP53, TSC1, TSC2, and VHL.     CANCER STAGING:  Cancer Staging  Rectal carcinoma [facility]) Staging form: Colon and Rectum, AJCC 8th Edition - Clinical stage from 06/18/2022: Stage IIIB (cT3, cN1, cM0) - Unsigned    INTERVAL HISTORY:  Troy Dillon 52 y.o. male seen for follow-up of rectal cancer and toxicity assessment prior to the next cycle of chemotherapy.  He had a temperature of 100.8 on 09/17/2022 and was evaluated in the ER.  I have reviewed all the ER records.  Cultures were negative after 4 days.  He was sent home without any antibiotic as there were no symptoms.  He tolerated last cycle of chemotherapy fairly well.  REVIEW OF SYSTEMS:  Review of Systems  All other  systems reviewed and are negative.    PAST MEDICAL/SURGICAL HISTORY:  Past Medical History:  Diagnosis Date   Rectal bleeding    Rectal cancer ([place]) 06/08/2022   Past Surgical History:  Procedure Laterality Date   COLONOSCOPY  06/08/2022   EYE SURGERY     FRACTURE SURGERY     Right Femur- Cables in bone remain in place, rod removed in 2000   IR IMAGING GUIDED PORT INSERTION  06/22/2022   ORTHOPEDIC SURGERY       SOCIAL HISTORY:  Social History   Socioeconomic History   Marital status: Married    Spouse name: Not on file   Number of children: Not on file   Years of education: Not on file   Highest education level: Not on file  Occupational History   Not on file  Tobacco Use   Smoking status: Former    Types: Cigarettes    Quit date: 2003    Years since quitting: 21.0   Smokeless tobacco: Current    Types: Snuff   Tobacco comments:    Some Dipping  Vaping Use   Vaping Use: Never used  Substance and Sexual Activity   Alcohol use: Yes    Comment: occasionally   Drug use: Never   Sexual activity: Not on file  Other Topics Concern   Not on file  Social History Narrative   Not on file   Social Determinants of Health   Financial Resource Strain: Not on file  Food Insecurity: Not on file  Transportation Needs: Not on file  Physical Activity: Not on file  Stress: Not on file  Social Connections: Not on file  Intimate Partner Violence: Not on file    FAMILY HISTORY:  Family History  Problem Relation Age of Onset   Cancer Mother 61 - 41       GYN malignancy details unknown, TAH/BSO   Colon cancer Father 33   Heart attack Father    Colon cancer Paternal Aunt 53   Colon cancer Paternal Grandmother 44    CURRENT MEDICATIONS:  Outpatient Encounter Medications as of 09/21/2022  Medication Sig   dextrose 5 % SOLN 1,000 mL with fluorouracil 5 GM/100ML SOLN Inject into the vein over 48 hr. Every 14 days   FLUOROURACIL IV Inject into the vein every 14  (fourteen) days.   ibuprofen (ADVIL) 200 MG tablet Take 200 mg by mouth every 6 (Troy) hours as needed for moderate pain.   LEUCOVORIN CALCIUM IV Inject into the vein every 14 (fourteen) days.   lidocaine-prilocaine (EMLA) cream Apply a small amount to port a cath site and cover with plastic wrap one hour prior to infusion appointments   megestrol (MEGACE) 400 MG/10ML suspension Take 10 mLs (400 mg total) by mouth 2 (two) times daily.   Multiple Vitamin (MULTIVITAMIN) tablet Take 1 tablet by mouth daily.   ondansetron (ZOFRAN) 8 MG tablet Take 1 tablet (8 mg total) by mouth every 8 (eight) hours as needed.   OXALIPLATIN IV Inject into the vein every 14 (fourteen) days.   prochlorperazine (COMPAZINE) 10 MG tablet Take 1 tablet (10 mg total) by mouth every 6 (Troy) hours as needed for nausea or vomiting.   promethazine (PHENERGAN) 25 MG suppository Place 1 suppository (25 mg total) rectally every 6 (Troy) hours as needed for nausea or vomiting.   Salicylic Acid, Acne, (SALICYLIC ACID EX) Apply 1 Application topically daily as needed (acne).   sodium chloride (OCEAN) 0.65 % SOLN nasal spray Place 1 spray into both nostrils as needed for congestion.   Facility-Administered Encounter Medications as of 09/21/2022  Medication   [COMPLETED] sodium chloride flush (NS) 0.9 % injection 10 mL    ALLERGIES:  Allergies  Allergen Reactions   Hydrocodone Itching   Latex     Eye irritation      PHYSICAL EXAM:  ECOG Performance status: 0  There were no vitals filed for this visit. Filed Weights   09/21/22 0933  Weight: 172 lb (78 kg)   Physical Exam Vitals reviewed.  Constitutional:      Appearance: Normal appearance.  Cardiovascular:     Rate and Rhythm: Normal rate and regular rhythm.     Pulses: Normal pulses.     Heart sounds: Normal heart sounds.  Pulmonary:     Effort: Pulmonary effort is normal.     Breath sounds: Normal breath sounds.  Neurological:     Mental Status: He is alert.   Psychiatric:        Mood and Affect: Mood normal.        Behavior: Behavior normal.      LABORATORY DATA:  I have reviewed the labs as listed.  CBC    Component Value Date/Time   WBC 5.8 09/21/2022 0917   RBC 4.12 (L) 09/21/2022 0917   HGB 13.8 09/21/2022 0917   HCT 38.6 (L) 09/21/2022 0917   PLT 137 (L) 09/21/2022 0917   MCV 93.7 09/21/2022 0917   MCH 33.5 09/21/2022 0917   MCHC 35.8 09/21/2022 0917   RDW 16.0 (H) 09/21/2022 0917   LYMPHSABS 1.4 09/21/2022 6812  MONOABS 0.7 09/21/2022 0917   EOSABS 0.1 09/21/2022 0917   BASOSABS 0.1 09/21/2022 0917      Latest Ref Rng & Units 09/21/2022    9:17 AM 09/17/2022   11:27 AM 09/07/2022    9:01 AM  CMP  Glucose 70 - 99 mg/dL 142  103  130   BUN 6 - 20 mg/dL '15  11  16   '$ Creatinine 0.61 - 1.24 mg/dL 0.96  0.88  0.99   Sodium 135 - 145 mmol/L 135  133  137   Potassium 3.5 - 5.1 mmol/L 3.7  3.9  3.6   Chloride 98 - 111 mmol/L 107  105  104   CO2 22 - 32 mmol/L '21  20  23   '$ Calcium 8.9 - 10.3 mg/dL 8.7  8.9  8.6   Total Protein 6.5 - 8.1 g/dL 7.2  7.1  6.9   Total Bilirubin 0.3 - 1.2 mg/dL 0.7  0.8  0.7   Alkaline Phos 38 - 126 U/L 179  206  184   AST 15 - 41 U/L 36  37  49   ALT 0 - 44 U/L 35  37  45     DIAGNOSTIC IMAGING:  I have independently reviewed the scans and discussed with the patient.  ASSESSMENT:  1.  Stage IIIB (T3cN1) rectal adenocarcinoma, MSI stable: - Presentation with rectal bleeding - Colonoscopy (06/08/2022): Fungating partially obstructing large mass found in the rectum 11 cm from anal verge.  Partially circumferential involving one half of the lumen.  Mass measured 3 cm in length.  Diameter 2 cm.  Oozing present.  1 semipedunculated polyp (40 cm from anus) and 3 sessile polyps in the cecum removed. - Pathology (06/08/2022): Rectal mass 11 cm from anal verge-adenocarcinoma.  MMR preserved.  MSI-stable. - CEA (06/08/2022): 2.9 - MRI pelvis (06/10/2022): T3CN1, tumor extends into sigmoid from the  high rectum, extension through muscularis propria, approximately 6 mm beyond the rectal wall.  Tumor begins in the highly portion of the rectum and extends into the sigmoid colon.  Extramural vascular invasion/tumor thrombus along the right lateral margin.  Shortest distance of tumor from mesorectal fascia: 15 mm.  Single high mesorectal or superior rectal lymph node measuring 9 mm.  Distance from tumor to internal anal sphincter is approximately 7-11 cm. - CT CAP (06/10/2022): Circumferential wall thickening of the upper/mid rectum, mildly enlarged high perirectal lymph nodes measuring up to 6 mm in short axis.  No evidence of distant metastatic disease in the chest, abdomen or pelvis. - Neoadjuvant FOLFOX cycle 1 on 06/29/2022   2.  Social/family history: - He lives at home with his wife Lattie Haw.  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  Denies any chemical exposure.  Does not smoke cigarettes but dips tobacco. - Father had colon cancer at age 58. - Paternal grandmother died of colon cancer. - Paternal aunt had colon cancer. - Mother had "male cancer" and underwent hysterectomy.    PLAN:  1.  Stage IIIb (T3CN1) rectal adenocarcinoma, MSI-stable: - Went to the ER on 09/17/2022 with temperature of 100.8.  Although blood cultures were negative. - Today he feels fine. - Labs today shows elevated alk phos 179 and rest of LFTs normal.  CBC was grossly normal. - Proceed with cycle 7 today.  Will plan on arranging MRI of the rectum in 3 weeks to evaluate response.  We talked about prospect trial.  If he gets a response of more than 20%,  he may forego radiation therapy.  2.  Nausea/vomiting: - Continue Zofran and Phenergan as needed.  3.  Peripheral neuropathy: - On and off numbness in the right foot has resolved after oxaliplatin dose reduced by 20%.  4.  Decreased appetite: - He took Megace for 1 week and stopped taking it as his appetite improved.      Orders placed this encounter:   Orders Placed This Encounter  Procedures   MR PELVIS WO CM RECTAL CA STAGING       Derek Jack, [place] [phone]

## 2022-09-21 NOTE — Progress Notes (Signed)
Patients port flushed without difficulty.  Good blood return noted with no bruising or swelling noted at site.  Stable during access and blood draw.  Patient to remain accessed for treatment. 

## 2022-09-21 NOTE — Progress Notes (Signed)
Patient has been examined by Dr. Katragadda, and vital signs and labs have been reviewed. ANC, Creatinine, LFTs, hemoglobin, and platelets are within treatment parameters per M.D. - pt may proceed with treatment.  Primary RN and pharmacy notified.  

## 2022-09-21 NOTE — Progress Notes (Signed)
Pt presents today for Folfox per provider's order. Vital signs and labs WNL for treatment.Okay to proceed with treatment today per Dr.K.  Folfox given today per MD orders. Tolerated infusion without adverse affects. Vital signs stable. No complaints at this time. Discharged from clinic ambulatory in stable condition. Alert and oriented x 3. F/U with [facility] as scheduled. 5FU ambulatory pump infusing.

## 2022-09-22 ENCOUNTER — Other Ambulatory Visit: Payer: Self-pay

## 2022-09-22 LAB — CULTURE, BLOOD (ROUTINE X 2)
Culture: NO GROWTH
Culture: NO GROWTH
Special Requests: ADEQUATE
Special Requests: ADEQUATE

## 2022-09-23 ENCOUNTER — Inpatient Hospital Stay: Payer: 59

## 2022-09-23 VITALS — BP 120/80 | HR 92 | Temp 98.9°F | Resp 18

## 2022-09-23 DIAGNOSIS — Z5111 Encounter for antineoplastic chemotherapy: Secondary | ICD-10-CM | POA: Diagnosis not present

## 2022-09-23 DIAGNOSIS — Z95828 Presence of other vascular implants and grafts: Secondary | ICD-10-CM

## 2022-09-23 DIAGNOSIS — C20 Malignant neoplasm of rectum: Secondary | ICD-10-CM

## 2022-09-23 DIAGNOSIS — Z5189 Encounter for other specified aftercare: Secondary | ICD-10-CM | POA: Diagnosis not present

## 2022-09-23 DIAGNOSIS — Z79899 Other long term (current) drug therapy: Secondary | ICD-10-CM | POA: Diagnosis not present

## 2022-09-23 MED ORDER — HEPARIN SOD (PORK) LOCK FLUSH 100 UNIT/ML IV SOLN
500.0000 [IU] | Freq: Once | INTRAVENOUS | Status: AC | PRN
  Administered 2022-09-23: 500 [IU]

## 2022-09-23 MED ORDER — PEGFILGRASTIM-JMDB 6 MG/0.6ML ~~LOC~~ SOSY
6.0000 mg | PREFILLED_SYRINGE | Freq: Once | SUBCUTANEOUS | Status: AC
  Administered 2022-09-23: 6 mg via SUBCUTANEOUS
  Filled 2022-09-23: qty 0.6

## 2022-09-23 MED ORDER — SODIUM CHLORIDE 0.9% FLUSH
10.0000 mL | INTRAVENOUS | Status: DC | PRN
  Administered 2022-09-23: 10 mL

## 2022-09-23 NOTE — Patient Instructions (Signed)
[facility]  Discharge Instructions: Thank you for choosing [place] to provide your oncology and hematology care.  If you have a lab appointment with the [facility], please come in thru the Main Entrance and check in at the main information desk.  Wear comfortable clothing and clothing appropriate for easy access to any Portacath or PICC line.   We strive to give you quality time with your provider. You may need to reschedule your appointment if you arrive late (15 or more minutes).  Arriving late affects you and other patients whose appointments are after yours.  Also, if you miss three or more appointments without notifying the office, you may be dismissed from the clinic at the provider's discretion.      For prescription refill requests, have your pharmacy contact our office and allow 72 hours for refills to be completed.    Today you received the following chemotherapy and/or immunotherapy agents Fulphila injection and pump d/c   To help prevent nausea and vomiting after your treatment, we encourage you to take your nausea medication as directed.  BELOW ARE SYMPTOMS THAT SHOULD BE REPORTED IMMEDIATELY: *FEVER GREATER THAN 100.4 F (38 C) OR HIGHER *CHILLS OR SWEATING *NAUSEA AND VOMITING THAT IS NOT CONTROLLED WITH YOUR NAUSEA MEDICATION *UNUSUAL SHORTNESS OF BREATH *UNUSUAL BRUISING OR BLEEDING *URINARY PROBLEMS (pain or burning when urinating, or frequent urination) *BOWEL PROBLEMS (unusual diarrhea, constipation, pain near the anus) TENDERNESS IN MOUTH AND THROAT WITH OR WITHOUT PRESENCE OF ULCERS (sore throat, sores in mouth, or a toothache) UNUSUAL RASH, SWELLING OR PAIN  UNUSUAL VAGINAL DISCHARGE OR ITCHING   Items with * indicate a potential emergency and should be followed up as soon as possible or go to the Emergency Department if any problems should occur.  Please show the CHEMOTHERAPY ALERT CARD or IMMUNOTHERAPY ALERT CARD at  check-in to the Emergency Department and triage nurse.  Should you have questions after your visit or need to cancel or reschedule your appointment, please contact [facility] [phone]  and follow the prompts.  Office hours are 8:00 a.m. to 4:30 p.m. Monday - Friday. Please note that voicemails left after 4:00 p.m. may not be returned until the following business day.  We are closed weekends and major holidays. You have access to a nurse at all times for urgent questions. Please call the main number to the clinic [phone] and follow the prompts.  For any non-urgent questions, you may also contact your provider using MyChart. We now offer e-Visits for anyone 87 and older to request care online for non-urgent symptoms. For details visit [email or web address].   Also download the MyChart app! Go to the app store, search "MyChart", open the app, select [facility], and log in with your MyChart username and password.

## 2022-09-23 NOTE — Progress Notes (Signed)
Pt presents today for 5FU chemotherapy pump and Fulphila injection per provider's order. Vital signs stable and pt voiced no new complaints at this time. Port flushed easily without difficulty with good blood with 10 mL of normal saline and 5 mL of heparin. Good blood return noted and needle removed intact. No bruising or swelling noted at the site.  Discharged from clinic ambulatory in stable condition. Alert and oriented x 3. F/U with [facility] as scheduled.

## 2022-09-29 DIAGNOSIS — C20 Malignant neoplasm of rectum: Secondary | ICD-10-CM | POA: Diagnosis not present

## 2022-10-05 ENCOUNTER — Inpatient Hospital Stay: Payer: 59

## 2022-10-05 ENCOUNTER — Inpatient Hospital Stay: Payer: 59 | Attending: Hematology

## 2022-10-05 ENCOUNTER — Encounter: Payer: Self-pay | Admitting: Hematology

## 2022-10-05 ENCOUNTER — Inpatient Hospital Stay (HOSPITAL_BASED_OUTPATIENT_CLINIC_OR_DEPARTMENT_OTHER): Payer: 59 | Admitting: Hematology

## 2022-10-05 VITALS — BP 122/80 | HR 91 | Resp 18

## 2022-10-05 DIAGNOSIS — Z95828 Presence of other vascular implants and grafts: Secondary | ICD-10-CM

## 2022-10-05 DIAGNOSIS — C20 Malignant neoplasm of rectum: Secondary | ICD-10-CM | POA: Diagnosis not present

## 2022-10-05 DIAGNOSIS — Z5189 Encounter for other specified aftercare: Secondary | ICD-10-CM | POA: Insufficient documentation

## 2022-10-05 DIAGNOSIS — Z79899 Other long term (current) drug therapy: Secondary | ICD-10-CM | POA: Insufficient documentation

## 2022-10-05 DIAGNOSIS — Z5111 Encounter for antineoplastic chemotherapy: Secondary | ICD-10-CM | POA: Insufficient documentation

## 2022-10-05 LAB — CBC WITH DIFFERENTIAL/PLATELET
Abs Immature Granulocytes: 0.1 10*3/uL — ABNORMAL HIGH (ref 0.00–0.07)
Basophils Absolute: 0.1 10*3/uL (ref 0.0–0.1)
Basophils Relative: 1 %
Eosinophils Absolute: 0 10*3/uL (ref 0.0–0.5)
Eosinophils Relative: 1 %
HCT: 38.3 % — ABNORMAL LOW (ref 39.0–52.0)
Hemoglobin: 13.4 g/dL (ref 13.0–17.0)
Immature Granulocytes: 2 %
Lymphocytes Relative: 24 %
Lymphs Abs: 1.2 10*3/uL (ref 0.7–4.0)
MCH: 33.7 pg (ref 26.0–34.0)
MCHC: 35 g/dL (ref 30.0–36.0)
MCV: 96.2 fL (ref 80.0–100.0)
Monocytes Absolute: 0.8 10*3/uL (ref 0.1–1.0)
Monocytes Relative: 15 %
Neutro Abs: 3 10*3/uL (ref 1.7–7.7)
Neutrophils Relative %: 57 %
Platelets: 136 10*3/uL — ABNORMAL LOW (ref 150–400)
RBC: 3.98 MIL/uL — ABNORMAL LOW (ref 4.22–5.81)
RDW: 15.3 % (ref 11.5–15.5)
WBC: 5.1 10*3/uL (ref 4.0–10.5)
nRBC: 0 % (ref 0.0–0.2)

## 2022-10-05 LAB — COMPREHENSIVE METABOLIC PANEL
ALT: 39 U/L (ref 0–44)
AST: 41 U/L (ref 15–41)
Albumin: 3.6 g/dL (ref 3.5–5.0)
Alkaline Phosphatase: 161 U/L — ABNORMAL HIGH (ref 38–126)
Anion gap: 7 (ref 5–15)
BUN: 15 mg/dL (ref 6–20)
CO2: 21 mmol/L — ABNORMAL LOW (ref 22–32)
Calcium: 8.6 mg/dL — ABNORMAL LOW (ref 8.9–10.3)
Chloride: 109 mmol/L (ref 98–111)
Creatinine, Ser: 0.98 mg/dL (ref 0.61–1.24)
GFR, Estimated: >60 mL/min (ref >60)
Glucose, Bld: 121 mg/dL — ABNORMAL HIGH (ref 70–99)
Potassium: 4.1 mmol/L (ref 3.5–5.1)
Sodium: 137 mmol/L (ref 135–145)
Total Bilirubin: 0.7 mg/dL (ref 0.3–1.2)
Total Protein: 7.1 g/dL (ref 6.5–8.1)

## 2022-10-05 LAB — MAGNESIUM: Magnesium: 2.3 mg/dL (ref 1.7–2.4)

## 2022-10-05 MED ORDER — SODIUM CHLORIDE 0.9% FLUSH
10.0000 mL | INTRAVENOUS | Status: DC | PRN
  Administered 2022-10-05: 10 mL via INTRAVENOUS

## 2022-10-05 MED ORDER — LEUCOVORIN CALCIUM INJECTION 350 MG
400.0000 mg/m2 | Freq: Once | INTRAVENOUS | Status: AC
  Administered 2022-10-05: 800 mg via INTRAVENOUS
  Filled 2022-10-05: qty 40

## 2022-10-05 MED ORDER — SODIUM CHLORIDE 0.9 % IV SOLN
2400.0000 mg/m2 | INTRAVENOUS | Status: DC
  Administered 2022-10-05: 5000 mg via INTRAVENOUS
  Filled 2022-10-05: qty 100

## 2022-10-05 MED ORDER — DEXTROSE 5 % IV SOLN: Freq: Once | INTRAVENOUS | Status: AC

## 2022-10-05 MED ORDER — SODIUM CHLORIDE 0.9 % IV SOLN
10.0000 mg | Freq: Once | INTRAVENOUS | Status: AC
  Administered 2022-10-05: 10 mg via INTRAVENOUS
  Filled 2022-10-05: qty 10

## 2022-10-05 MED ORDER — PALONOSETRON HCL INJECTION 0.25 MG/5ML
0.2500 mg | Freq: Once | INTRAVENOUS | Status: AC
  Administered 2022-10-05: 0.25 mg via INTRAVENOUS
  Filled 2022-10-05: qty 5

## 2022-10-05 MED ORDER — SODIUM CHLORIDE 0.9 % IV SOLN
150.0000 mg | Freq: Once | INTRAVENOUS | Status: AC
  Administered 2022-10-05: 150 mg via INTRAVENOUS
  Filled 2022-10-05: qty 150

## 2022-10-05 MED ORDER — OXALIPLATIN CHEMO INJECTION 100 MG/20ML
68.0000 mg/m2 | Freq: Once | INTRAVENOUS | Status: AC
  Administered 2022-10-05: 150 mg via INTRAVENOUS
  Filled 2022-10-05: qty 30

## 2022-10-05 MED ORDER — FLUOROURACIL CHEMO INJECTION 2.5 GM/50ML
400.0000 mg/m2 | Freq: Once | INTRAVENOUS | Status: AC
  Administered 2022-10-05: 800 mg via INTRAVENOUS
  Filled 2022-10-05: qty 16

## 2022-10-05 NOTE — Progress Notes (Signed)
Patient presents today for chemotherapy infusion.  Patient is in satisfactory condition with no new complaints voiced.  Vital signs are stable.  Labs reviewed by Dr. Delton Coombes during his office visit.  All labs are within treatment parameters.  We will proceed with treatment per MD orders.   Patient tolerated treatment well with no complaints voiced.  Home infusion 5FU pump connected with no issues.   Patient left ambulatory in stable condition.  Vital signs stable at discharge.  Follow up as scheduled.

## 2022-10-05 NOTE — Patient Instructions (Addendum)
[place] at [facility] Discharge Instructions   You were seen and examined today by Dr. Delton Coombes.  He reviewed the results of your lab work which are normal/stable.   We will proceed with your final treatment today.  Proceed with MRI as scheduled on 10/13/2022.   We will see you back after the MRI to review the results and determine the next steps of treatment.    Thank you for choosing [place] at [facility] to provide your oncology and hematology care.  To afford each patient quality time with our provider, please arrive at least 15 minutes before your scheduled appointment time.   If you have a lab appointment with the [facility] please come in thru the Main Entrance and check in at the main information desk.  You need to re-schedule your appointment should you arrive 10 or more minutes late.  We strive to give you quality time with our providers, and arriving late affects you and other patients whose appointments are after yours.  Also, if you no show three or more times for appointments you may be dismissed from the clinic at the providers discretion.     Again, thank you for choosing [facility].  Our hope is that these requests will decrease the amount of time that you wait before being seen by our physicians.       _____________________________________________________________  Should you have questions after your visit to [facility], please contact our office at [phone] and follow the prompts.  Our office hours are 8:00 a.m. and 4:30 p.m. Monday - Friday.  Please note that voicemails left after 4:00 p.m. may not be returned until the following business day.  We are closed weekends and major holidays.  You do have access to a nurse 24-7, just call the main number to the clinic [phone] and do not press any options, hold on the line and a nurse will answer the phone.    For prescription refill  requests, have your pharmacy contact our office and allow 72 hours.    Due to Covid, you will need to wear a mask upon entering the hospital. If you do not have a mask, a mask will be given to you at the Main Entrance upon arrival. For doctor visits, patients may have 1 support person age 77 or older with them. For treatment visits, patients can not have anyone with them due to social distancing guidelines and our immunocompromised population.

## 2022-10-05 NOTE — Patient Instructions (Addendum)
[facility] [place]  Discharge Instructions: Thank you for choosing [facility] to provide your oncology and hematology care.  If you have a lab appointment with the Cancer Center, please come in thru the Main Entrance and check in at the main information desk.  Wear comfortable clothing and clothing appropriate for easy access to any Portacath or PICC line.   We strive to give you quality time with your provider. You may need to reschedule your appointment if you arrive late (15 or more minutes).  Arriving late affects you and other patients whose appointments are after yours.  Also, if you miss three or more appointments without notifying the office, you may be dismissed from the clinic at the provider's discretion.      For prescription refill requests, have your pharmacy contact our office and allow 72 hours for refills to be completed.    Today you received the following chemotherapy and/or immunotherapy agents Oxaliplatin/Leucovorin/5FU.   Oxaliplatin Injection What is this medication? OXALIPLATIN (ox AL i PLA tin) treats some types of cancer. It works by slowing down the growth of cancer cells. This medicine may be used for other purposes; ask your health care provider or pharmacist if you have questions. COMMON BRAND NAME(S): Eloxatin What should I tell my care team before I take this medication? They need to know if you have any of these conditions: Heart disease History of irregular heartbeat or rhythm Liver disease Low blood cell levels (white cells, red cells, and platelets) Lung or breathing disease, such as asthma Take medications that treat or prevent blood clots Tingling of the fingers, toes, or other nerve disorder An unusual or allergic reaction to oxaliplatin, other medications, foods, dyes, or preservatives If you or your partner are pregnant or trying to get pregnant Breast-feeding How should I use this medication? This medication is  injected into a vein. It is given by your care team in a hospital or clinic setting. Talk to your care team about the use of this medication in children. Special care may be needed. Overdosage: If you think you have taken too much of this medicine contact a poison control center or emergency room at once. NOTE: This medicine is only for you. Do not share this medicine with others. What if I miss a dose? Keep appointments for follow-up doses. It is important not to miss a dose. Call your care team if you are unable to keep an appointment. What may interact with this medication? Do not take this medication with any of the following: Cisapride Dronedarone Pimozide Thioridazine This medication may also interact with the following: Aspirin and aspirin-like medications Certain medications that treat or prevent blood clots, such as warfarin, apixaban, dabigatran, and rivaroxaban Cisplatin Cyclosporine Diuretics Medications for infection, such as acyclovir, adefovir, amphotericin B, bacitracin, cidofovir, foscarnet, ganciclovir, gentamicin, pentamidine, vancomycin NSAIDs, medications for pain and inflammation, such as ibuprofen or naproxen Other medications that cause heart rhythm changes Pamidronate Zoledronic acid This list may not describe all possible interactions. Give your health care provider a list of all the medicines, herbs, non-prescription drugs, or dietary supplements you use. Also tell them if you smoke, drink alcohol, or use illegal drugs. Some items may interact with your medicine. What should I watch for while using this medication? Your condition will be monitored carefully while you are receiving this medication. You may need blood work while taking this medication. This medication may make you feel generally unwell. This is not uncommon as chemotherapy   can affect healthy cells as well as cancer cells. Report any side effects. Continue your course of treatment even though you  feel ill unless your care team tells you to stop. This medication may increase your risk of getting an infection. Call your care team for advice if you get a fever, chills, sore throat, or other symptoms of a cold or flu. Do not treat yourself. Try to avoid being around people who are sick. Avoid taking medications that contain aspirin, acetaminophen, ibuprofen, naproxen, or ketoprofen unless instructed by your care team. These medications may hide a fever. Be careful brushing or flossing your teeth or using a toothpick because you may get an infection or bleed more easily. If you have any dental work done, tell your dentist you are receiving this medication. This medication can make you more sensitive to cold. Do not drink cold drinks or use ice. Cover exposed skin before coming in contact with cold temperatures or cold objects. When out in cold weather wear warm clothing and cover your mouth and nose to warm the air that goes into your lungs. Tell your care team if you get sensitive to the cold. Talk to your care team if you or your partner are pregnant or think either of you might be pregnant. This medication can cause serious birth defects if taken during pregnancy and for 9 months after the last dose. A negative pregnancy test is required before starting this medication. A reliable form of contraception is recommended while taking this medication and for 9 months after the last dose. Talk to your care team about effective forms of contraception. Do not father a child while taking this medication and for 6 months after the last dose. Use a condom while having sex during this time period. Do not breastfeed while taking this medication and for 3 months after the last dose. This medication may cause infertility. Talk to your care team if you are concerned about your fertility. What side effects may I notice from receiving this medication? Side effects that you should report to your care team as soon as  possible: Allergic reactions--skin rash, itching, hives, swelling of the face, lips, tongue, or throat Bleeding--bloody or black, tar-like stools, vomiting blood or Lenoria Narine material that looks like coffee grounds, red or dark Quita Mcgrory urine, small red or purple spots on skin, unusual bruising or bleeding Dry cough, shortness of breath or trouble breathing Heart rhythm changes--fast or irregular heartbeat, dizziness, feeling faint or lightheaded, chest pain, trouble breathing Infection--fever, chills, cough, sore throat, wounds that don't heal, pain or trouble when passing urine, general feeling of discomfort or being unwell Liver injury--right upper belly pain, loss of appetite, nausea, light-colored stool, dark yellow or Melchizedek Espinola urine, yellowing skin or eyes, unusual weakness or fatigue Low red blood cell level--unusual weakness or fatigue, dizziness, headache, trouble breathing Muscle injury--unusual weakness or fatigue, muscle pain, dark yellow or Nealie Mchatton urine, decrease in amount of urine Pain, tingling, or numbness in the hands or feet Sudden and severe headache, confusion, change in vision, seizures, which may be signs of posterior reversible encephalopathy syndrome (PRES) Unusual bruising or bleeding Side effects that usually do not require medical attention (report to your care team if they continue or are bothersome): Diarrhea Nausea Pain, redness, or swelling with sores inside the mouth or throat Unusual weakness or fatigue Vomiting This list may not describe all possible side effects. Call your doctor for medical advice about side effects. You may report side effects to FDA at   1-800-FDA-1088. Where should I keep my medication? This medication is given in a hospital or clinic. It will not be stored at home. NOTE: This sheet is a summary. It may not cover all possible information. If you have questions about this medicine, talk to your doctor, pharmacist, or health care provider.  2023  Elsevier/Gold Standard (2007-10-01 00:00:00)    Leucovorin Injection What is this medication? LEUCOVORIN (loo koe VOR in) prevents side effects from certain medications, such as methotrexate. It works by increasing folate levels. This helps protect healthy cells in your body. It may also be used to treat anemia caused by low levels of folate. It can also be used with fluorouracil, a type of chemotherapy, to treat colorectal cancer. It works by increasing the effects of fluorouracil in the body. This medicine may be used for other purposes; ask your health care provider or pharmacist if you have questions. What should I tell my care team before I take this medication? They need to know if you have any of these conditions: Anemia from low levels of vitamin B12 in the blood An unusual or allergic reaction to leucovorin, folic acid, other medications, foods, dyes, or preservatives Pregnant or trying to get pregnant Breastfeeding How should I use this medication? This medication is injected into a vein or a muscle. It is given by your care team in a hospital or clinic setting. Talk to your care team about the use of this medication in children. Special care may be needed. Overdosage: If you think you have taken too much of this medicine contact a poison control center or emergency room at once. NOTE: This medicine is only for you. Do not share this medicine with others. What if I miss a dose? Keep appointments for follow-up doses. It is important not to miss your dose. Call your care team if you are unable to keep an appointment. What may interact with this medication? Capecitabine Fluorouracil Phenobarbital Phenytoin Primidone Trimethoprim;sulfamethoxazole This list may not describe all possible interactions. Give your health care provider a list of all the medicines, herbs, non-prescription drugs, or dietary supplements you use. Also tell them if you smoke, drink alcohol, or use illegal  drugs. Some items may interact with your medicine. What should I watch for while using this medication? Your condition will be monitored carefully while you are receiving this medication. This medication may increase the side effects of 5-fluorouracil. Tell your care team if you have diarrhea or mouth sores that do not get better or that get worse. What side effects may I notice from receiving this medication? Side effects that you should report to your care team as soon as possible: Allergic reactions--skin rash, itching, hives, swelling of the face, lips, tongue, or throat This list may not describe all possible side effects. Call your doctor for medical advice about side effects. You may report side effects to FDA at 1-800-FDA-1088. Where should I keep my medication? This medication is given in a hospital or clinic. It will not be stored at home. NOTE: This sheet is a summary. It may not cover all possible information. If you have questions about this medicine, talk to your doctor, pharmacist, or health care provider.  2023 Elsevier/Gold Standard (2022-01-13 00:00:00)    Fluorouracil Injection What is this medication? FLUOROURACIL (flure oh YOOR a sil) treats some types of cancer. It works by slowing down the growth of cancer cells. This medicine may be used for other purposes; ask your health care provider or pharmacist if   you have questions. COMMON BRAND NAME(S): Adrucil What should I tell my care team before I take this medication? They need to know if you have any of these conditions: Blood disorders Dihydropyrimidine dehydrogenase (DPD) deficiency Infection, such as chickenpox, cold sores, herpes Kidney disease Liver disease Poor nutrition Recent or ongoing radiation therapy An unusual or allergic reaction to fluorouracil, other medications, foods, dyes, or preservatives If you or your partner are pregnant or trying to get pregnant Breast-feeding How should I use this  medication? This medication is injected into a vein. It is administered by your care team in a hospital or clinic setting. Talk to your care team about the use of this medication in children. Special care may be needed. Overdosage: If you think you have taken too much of this medicine contact a poison control center or emergency room at once. NOTE: This medicine is only for you. Do not share this medicine with others. What if I miss a dose? Keep appointments for follow-up doses. It is important not to miss your dose. Call your care team if you are unable to keep an appointment. What may interact with this medication? Do not take this medication with any of the following: Live virus vaccines This medication may also interact with the following: Medications that treat or prevent blood clots, such as warfarin, enoxaparin, dalteparin This list may not describe all possible interactions. Give your health care provider a list of all the medicines, herbs, non-prescription drugs, or dietary supplements you use. Also tell them if you smoke, drink alcohol, or use illegal drugs. Some items may interact with your medicine. What should I watch for while using this medication? Your condition will be monitored carefully while you are receiving this medication. This medication may make you feel generally unwell. This is not uncommon as chemotherapy can affect healthy cells as well as cancer cells. Report any side effects. Continue your course of treatment even though you feel ill unless your care team tells you to stop. In some cases, you may be given additional medications to help with side effects. Follow all directions for their use. This medication may increase your risk of getting an infection. Call your care team for advice if you get a fever, chills, sore throat, or other symptoms of a cold or flu. Do not treat yourself. Try to avoid being around people who are sick. This medication may increase your risk  to bruise or bleed. Call your care team if you notice any unusual bleeding. Be careful brushing or flossing your teeth or using a toothpick because you may get an infection or bleed more easily. If you have any dental work done, tell your dentist you are receiving this medication. Avoid taking medications that contain aspirin, acetaminophen, ibuprofen, naproxen, or ketoprofen unless instructed by your care team. These medications may hide a fever. Do not treat diarrhea with over the counter products. Contact your care team if you have diarrhea that lasts more than 2 days or if it is severe and watery. This medication can make you more sensitive to the sun. Keep out of the sun. If you cannot avoid being in the sun, wear protective clothing and sunscreen. Do not use sun lamps, tanning beds, or tanning booths. Talk to your care team if you or your partner wish to become pregnant or think you might be pregnant. This medication can cause serious birth defects if taken during pregnancy and for 3 months after the last dose. A reliable form of contraception   is recommended while taking this medication and for 3 months after the last dose. Talk to your care team about effective forms of contraception. Do not father a child while taking this medication and for 3 months after the last dose. Use a condom while having sex during this time period. Do not breastfeed while taking this medication. This medication may cause infertility. Talk to your care team if you are concerned about your fertility. What side effects may I notice from receiving this medication? Side effects that you should report to your care team as soon as possible: Allergic reactions--skin rash, itching, hives, swelling of the face, lips, tongue, or throat Heart attack--pain or tightness in the chest, shoulders, arms, or jaw, nausea, shortness of breath, cold or clammy skin, feeling faint or lightheaded Heart failure--shortness of breath, swelling of  the ankles, feet, or hands, sudden weight gain, unusual weakness or fatigue Heart rhythm changes--fast or irregular heartbeat, dizziness, feeling faint or lightheaded, chest pain, trouble breathing High ammonia level--unusual weakness or fatigue, confusion, loss of appetite, nausea, vomiting, seizures Infection--fever, chills, cough, sore throat, wounds that don't heal, pain or trouble when passing urine, general feeling of discomfort or being unwell Low red blood cell level--unusual weakness or fatigue, dizziness, headache, trouble breathing Pain, tingling, or numbness in the hands or feet, muscle weakness, change in vision, confusion or trouble speaking, loss of balance or coordination, trouble walking, seizures Redness, swelling, and blistering of the skin over hands and feet Severe or prolonged diarrhea Unusual bruising or bleeding Side effects that usually do not require medical attention (report to your care team if they continue or are bothersome): Dry skin Headache Increased tears Nausea Pain, redness, or swelling with sores inside the mouth or throat Sensitivity to light Vomiting This list may not describe all possible side effects. Call your doctor for medical advice about side effects. You may report side effects to FDA at 1-800-FDA-1088. Where should I keep my medication? This medication is given in a hospital or clinic. It will not be stored at home. NOTE: This sheet is a summary. It may not cover all possible information. If you have questions about this medicine, talk to your doctor, pharmacist, or health care provider.  2023 Elsevier/Gold Standard (2021-12-09 00:00:00)        To help prevent nausea and vomiting after your treatment, we encourage you to take your nausea medication as directed.  BELOW ARE SYMPTOMS THAT SHOULD BE REPORTED IMMEDIATELY: *FEVER GREATER THAN 100.4 F (38 C) OR HIGHER *CHILLS OR SWEATING *NAUSEA AND VOMITING THAT IS NOT CONTROLLED WITH YOUR  NAUSEA MEDICATION *UNUSUAL SHORTNESS OF BREATH *UNUSUAL BRUISING OR BLEEDING *URINARY PROBLEMS (pain or burning when urinating, or frequent urination) *BOWEL PROBLEMS (unusual diarrhea, constipation, pain near the anus) TENDERNESS IN MOUTH AND THROAT WITH OR WITHOUT PRESENCE OF ULCERS (sore throat, sores in mouth, or a toothache) UNUSUAL RASH, SWELLING OR PAIN  UNUSUAL VAGINAL DISCHARGE OR ITCHING   Items with * indicate a potential emergency and should be followed up as soon as possible or go to the Emergency Department if any problems should occur.  Please show the CHEMOTHERAPY ALERT CARD or IMMUNOTHERAPY ALERT CARD at check-in to the Emergency Department and triage nurse.  Should you have questions after your visit or need to cancel or reschedule your appointment, please contact [facility] [phone]  and follow the prompts.  Office hours are 8:00 a.m. to 4:30 p.m. Monday - Friday. Please note that voicemails left after 4:00 p.m.   may not be returned until the following business day.  We are closed weekends and major holidays. You have access to a nurse at all times for urgent questions. Please call the main number to the clinic [phone] and follow the prompts.  For any non-urgent questions, you may also contact your provider using MyChart. We now offer e-Visits for anyone 18 and older to request care online for non-urgent symptoms. For details visit [email or web address].   Also download the MyChart app! Go to the app store, search "MyChart", open the app, select [place], and log in with your MyChart username and password.   

## 2022-10-05 NOTE — Progress Notes (Signed)
[place], [place] [address]    Clinic Day:  10/05/2022  Referring physician: [facility]  Patient Care Team: [facility] as PCP - General Janina Mayo, MD as PCP - Cardiology (Cardiology) Derek Jack, MD as Medical Oncologist (Medical Oncology) Brien Mates, RN as Oncology Nurse Navigator (Medical Oncology)   ASSESSMENT & PLAN:   Assessment: 1.  Stage IIIB (T3cN1) rectal adenocarcinoma, MSI stable: - Presentation with rectal bleeding - Colonoscopy (06/08/2022): Fungating partially obstructing large mass found in the rectum 11 cm from anal verge.  Partially circumferential involving one half of the lumen.  Mass measured 3 cm in length.  Diameter 2 cm.  Oozing present.  1 semipedunculated polyp (40 cm from anus) and 3 sessile polyps in the cecum removed. - Pathology (06/08/2022): Rectal mass 11 cm from anal verge-adenocarcinoma.  MMR preserved.  MSI-stable. - CEA (06/08/2022): 2.9 - MRI pelvis (06/10/2022): T3CN1, tumor extends into sigmoid from the high rectum, extension through muscularis propria, approximately 6 mm beyond the rectal wall.  Tumor begins in the highly portion of the rectum and extends into the sigmoid colon.  Extramural vascular invasion/tumor thrombus along the right lateral margin.  Shortest distance of tumor from mesorectal fascia: 15 mm.  Single high mesorectal or superior rectal lymph node measuring 9 mm.  Distance from tumor to internal anal sphincter is approximately 7-11 cm. - CT CAP (06/10/2022): Circumferential wall thickening of the upper/mid rectum, mildly enlarged high perirectal lymph nodes measuring up to 6 mm in short axis.  No evidence of distant metastatic disease in the chest, abdomen or pelvis. - Neoadjuvant FOLFOX 8 cycles from 06/29/2022 through 10/05/2022   2.  Social/family history: - He lives at home with his wife Lattie Haw.  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   Denies any chemical exposure.  Does not smoke cigarettes but dips tobacco. - Father had colon cancer at age 55. - Paternal grandmother died of colon cancer. - Paternal aunt had colon cancer. - Mother had "male cancer" and underwent hysterectomy.  Plan: 1.  Stage IIIb (T3CN1) rectal adenocarcinoma, MSI-stable: - He complains of shortness of breath on exertion.  Lungs are clear to auscultation. - Labs today shows normal LFTs and elevated alk phos stable.  CBC is grossly normal. - Proceed with cycle 8 today.  Will schedule MRI of the pelvis, rectal cancer protocol prior to next visit in 2 weeks to discuss further plan of action.  If he has more than 20% response, based on [place] trial, he can go directly to surgery.   2.  Nausea/vomiting: - Continue Zofran and Phenergan as needed.   3.  Peripheral neuropathy: - He has some numbness in the right foot if he does not move much.  This is stable since he had femur fracture few years ago.  Oxaliplatin is 20% dose reduced.     No orders of the defined types were placed in this encounter.     Beverly Gust Oliver,acting as a scribe for Derek Jack, MD.,have documented all relevant documentation on the behalf of Derek Jack, MD,as directed by  Derek Jack, MD while in the presence of Derek Jack, MD.   I, Derek Jack MD, have reviewed the above documentation for accuracy and completeness, and I agree with the above.   Derek Jack, MD   2/12/202410:51 AM  CHIEF COMPLAINT:   Diagnosis: stage III rectal cancer    Cancer Staging  Rectal carcinoma (  [place]) Staging form: Colon and Rectum, AJCC 8th Edition - Clinical stage from 06/18/2022: Stage IIIB (cT3, cN1, cM0) - Unsigned    Prior Therapy: None  Current Therapy:  FOLFOX    HISTORY OF PRESENT ILLNESS:   Oncology History  Rectal carcinoma ([place])  06/17/2022 Initial Diagnosis   Rectal carcinoma ([place])   06/29/2022 -  Chemotherapy    Patient is on Treatment Plan : COLORECTAL FOLFOX q14d x 4 months      Genetic Testing   [facility] Common Cancer Panel+RNA was Negative. Report date is 08/29/2022.  The Common Hereditary Cancers Panel offered by [facility] includes sequencing and/or deletion duplication testing of the following 48 genes: APC, ATM, AXIN2, BAP1, BARD1, BMPR1A, BRCA1, BRCA2, BRIP1, CDH1, CDK4, CDKN2A (p14ARF and p16INK4a only), CHEK2, CTNNA1, DICER1, EPCAM (Deletion/duplication testing only), FH, GREM1 (promoter region duplication testing only), HOXB13, KIT, MBD4, MEN1, MLH1, MSH2, MSH3, MSH6, MUTYH, NF1, NHTL1, PALB2, PDGFRA, PMS2, POLD1, POLE, PTEN, RAD51C, RAD51D, SDHA (sequencing analysis only except exon 14), SDHB, SDHC, SDHD, SMAD4, SMARCA4. STK11, TP53, TSC1, TSC2, and VHL.      INTERVAL HISTORY:   [name] is a 52 y.o. male presenting to clinic today for follow up of stage III rectal cancer. He was last seen by me on 09/21/22.  Today, he states that he is doing well overall. His appetite level is at 75%. His energy level is at 80%. His right foot occasionally goes numb when he sits for a long time such as when driving. He reports that he crushed his right femur in 1997 and has had this numbness issue since that time. He denies any balance issues. He denies any swelling of the ankles. He has occasional shortness of the breath when walking, particularly uphill. This is mild and unchanging. He denies any cough.  PAST MEDICAL HISTORY:   Past Medical History: Past Medical History:  Diagnosis Date   Rectal bleeding    Rectal cancer ([place]) 06/08/2022    Surgical History: Past Surgical History:  Procedure Laterality Date   COLONOSCOPY  06/08/2022   EYE SURGERY     FRACTURE SURGERY     Right Femur- Cables in bone remain in place, rod removed in 2000   IR IMAGING GUIDED PORT INSERTION  06/22/2022   ORTHOPEDIC SURGERY      Social History: Social History   Socioeconomic History   Marital status: Married    Spouse  name: Not on file   Number of children: Not on file   Years of education: Not on file   Highest education level: Not on file  Occupational History   Not on file  Tobacco Use   Smoking status: Former    Types: Cigarettes    Quit date: 2003    Years since quitting: 21.1   Smokeless tobacco: Current    Types: Snuff   Tobacco comments:    Some Dipping  Vaping Use   Vaping Use: Never used  Substance and Sexual Activity   Alcohol use: Yes    Comment: occasionally   Drug use: Never   Sexual activity: Not on file  Other Topics Concern   Not on file  Social History Narrative   Not on file   Social Determinants of Health   Financial Resource Strain: Not on file  Food Insecurity: Not on file  Transportation Needs: Not on file  Physical Activity: Not on file  Stress: Not on file  Social Connections: Not on file  Intimate Partner Violence: Not on file  Family History: Family History  Problem Relation Age of Onset   Cancer Mother 75 - 55       GYN malignancy details unknown, TAH/BSO   Colon cancer Father 79   Heart attack Father    Colon cancer Paternal Aunt 70   Colon cancer Paternal Grandmother 44    Current Medications:  Current Outpatient Medications:    dextrose 5 % SOLN 1,000 mL with fluorouracil 5 GM/100ML SOLN, Inject into the vein over 48 hr. Every 14 days, Disp: , Rfl:    FLUOROURACIL IV, Inject into the vein every 14 (fourteen) days., Disp: , Rfl:    ibuprofen (ADVIL) 200 MG tablet, Take 200 mg by mouth every 6 (six) hours as needed for moderate pain., Disp: , Rfl:    LEUCOVORIN CALCIUM IV, Inject into the vein every 14 (fourteen) days., Disp: , Rfl:    lidocaine-prilocaine (EMLA) cream, Apply a small amount to port a cath site and cover with plastic wrap one hour prior to infusion appointments, Disp: 30 g, Rfl: 3   megestrol (MEGACE) 400 MG/10ML suspension, Take 10 mLs (400 mg total) by mouth 2 (two) times daily., Disp: 480 mL, Rfl: 3   Multiple Vitamin  (MULTIVITAMIN) tablet, Take 1 tablet by mouth daily., Disp: , Rfl:    ondansetron (ZOFRAN) 8 MG tablet, Take 1 tablet (8 mg total) by mouth every 8 (eight) hours as needed., Disp: 60 tablet, Rfl: 2   OXALIPLATIN IV, Inject into the vein every 14 (fourteen) days., Disp: , Rfl:    prochlorperazine (COMPAZINE) 10 MG tablet, Take 1 tablet (10 mg total) by mouth every 6 (six) hours as needed for nausea or vomiting., Disp: 30 tablet, Rfl: 1   promethazine (PHENERGAN) 25 MG suppository, Place 1 suppository (25 mg total) rectally every 6 (six) hours as needed for nausea or vomiting., Disp: 120 each, Rfl: 2   Salicylic Acid, Acne, (SALICYLIC ACID EX), Apply 1 Application topically daily as needed (acne)., Disp: , Rfl:    sodium chloride (OCEAN) 0.65 % SOLN nasal spray, Place 1 spray into both nostrils as needed for congestion., Disp: , Rfl:  No current facility-administered medications for this visit.  Facility-Administered Medications Ordered in Other Visits:    dexamethasone (DECADRON) 10 mg in sodium chloride 0.9 % 50 mL IVPB, 10 mg, Intravenous, Once, Derek Jack, MD, Last Rate: 204 mL/hr at 10/05/22 1049, 10 mg at 10/05/22 1049   fluorouracil (ADRUCIL) 5,000 mg in sodium chloride 0.9 % 150 mL chemo infusion, 2,400 mg/m2 (Treatment Plan Recorded), Intravenous, 1 day or 1 dose, Derek Jack, MD   fluorouracil (ADRUCIL) chemo injection 800 mg, 400 mg/m2 (Treatment Plan Recorded), Intravenous, Once, Derek Jack, MD   fosaprepitant (EMEND) 150 mg in sodium chloride 0.9 % 145 mL IVPB, 150 mg, Intravenous, Once, Derek Jack, MD, Last Rate: 450 mL/hr at 10/05/22 1049, 150 mg at 10/05/22 1049   leucovorin 800 mg in dextrose 5 % 250 mL infusion, 400 mg/m2 (Treatment Plan Recorded), Intravenous, Once, Derek Jack, MD   oxaliplatin (ELOXATIN) 150 mg in dextrose 5 % 500 mL chemo infusion, 68 mg/m2 (Treatment Plan Recorded), Intravenous, Once, Derek Jack, MD    Allergies: Allergies  Allergen Reactions   Hydrocodone Itching   Latex     Eye irritation     REVIEW OF SYSTEMS:   Review of Systems  Constitutional:  Negative for chills, fatigue and fever.  HENT:   Negative for lump/mass, mouth sores, nosebleeds, sore throat and trouble swallowing.   Eyes:  Negative for eye problems.  Respiratory:  Positive for shortness of breath. Negative for cough.   Cardiovascular:  Negative for chest pain, leg swelling and palpitations.  Gastrointestinal:  Negative for abdominal pain, constipation, diarrhea, nausea and vomiting.  Genitourinary:  Negative for bladder incontinence, difficulty urinating, dysuria, frequency, hematuria and nocturia.   Musculoskeletal:  Negative for arthralgias, back pain, flank pain, myalgias and neck pain.  Skin:  Negative for itching and rash.  Neurological:  Positive for numbness (Tightness of hands and feet). Negative for dizziness and headaches.  Hematological:  Does not bruise/bleed easily.  Psychiatric/Behavioral:  Positive for depression. Negative for sleep disturbance and suicidal ideas. The patient is not nervous/anxious.   All other systems reviewed and are negative.    VITALS:   There were no vitals taken for this visit.  Wt Readings from Last 3 Encounters:  10/05/22 172 lb 3.2 oz (78.1 kg)  09/21/22 172 lb (78 kg)  09/17/22 176 lb (79.8 kg)    There is no height or weight on file to calculate BMI.  Performance status (ECOG): 0 - Asymptomatic  PHYSICAL EXAM:   Physical Exam Vitals and nursing note reviewed. Exam conducted with a chaperone present.  Constitutional:      Appearance: Normal appearance.  Cardiovascular:     Rate and Rhythm: Normal rate and regular rhythm.     Pulses: Normal pulses.     Heart sounds: Normal heart sounds.  Pulmonary:     Effort: Pulmonary effort is normal.     Breath sounds: Normal breath sounds.  Abdominal:     Palpations: Abdomen is soft. There is no hepatomegaly,  splenomegaly or mass.     Tenderness: There is no abdominal tenderness.  Musculoskeletal:     Right lower leg: No edema.     Left lower leg: No edema.  Lymphadenopathy:     Cervical: No cervical adenopathy.     Right cervical: No superficial, deep or posterior cervical adenopathy.    Left cervical: No superficial, deep or posterior cervical adenopathy.     Upper Body:     Right upper body: No supraclavicular or axillary adenopathy.     Left upper body: No supraclavicular or axillary adenopathy.  Neurological:     General: No focal deficit present.     Mental Status: He is alert and oriented to person, place, and time.  Psychiatric:        Mood and Affect: Mood normal.        Behavior: Behavior normal.     LABS:      Latest Ref Rng & Units 10/05/2022    9:33 AM 09/21/2022    9:17 AM 09/17/2022   11:27 AM  CBC  WBC 4.0 - 10.5 K/uL 5.1  5.8  13.3   Hemoglobin 13.0 - 17.0 g/dL 13.4  13.8  13.3   Hematocrit 39.0 - 52.0 % 38.3  38.6  37.1   Platelets 150 - 400 K/uL 136  137  118       Latest Ref Rng & Units 10/05/2022    9:33 AM 09/21/2022    9:17 AM 09/17/2022   11:27 AM  CMP  Glucose 70 - 99 mg/dL 121  142  103   BUN 6 - 20 mg/dL 15  15  11   $ Creatinine 0.61 - 1.24 mg/dL 0.98  0.96  0.88   Sodium 135 - 145 mmol/L 137  135  133   Potassium 3.5 - 5.1 mmol/L 4.1  3.7  3.9  Chloride 98 - 111 mmol/L 109  107  105   CO2 22 - 32 mmol/L 21  21  20   $ Calcium 8.9 - 10.3 mg/dL 8.6  8.7  8.9   Total Protein 6.5 - 8.1 g/dL 7.1  7.2  7.1   Total Bilirubin 0.3 - 1.2 mg/dL 0.7  0.7  0.8   Alkaline Phos 38 - 126 U/L 161  179  206   AST 15 - 41 U/L 41  36  37   ALT 0 - 44 U/L 39  35  37      No results found for: "CEA1", "CEA" / No results found for: "CEA1", "CEA" No results found for: "PSA1" No results found for: "[id number]" No results found for: "CAN125"  No results found for: "TOTALPROTELP", "ALBUMINELP", "A1GS", "A2GS", "BETS", "BETA2SER", "GAMS", "MSPIKE", "SPEI" No results found  for: "TIBC", "FERRITIN", "IRONPCTSAT" No results found for: "LDH"   STUDIES:   DG Chest Port 1 View  Result Date: 09/17/2022 CLINICAL DATA:  Questionable sepsis - evaluate for abnormality EXAM: PORTABLE CHEST - 1 VIEW COMPARISON:  06/08/2022 FINDINGS: Cardiac silhouette is unremarkable. No pneumothorax or pleural effusion. The lungs are clear. The visualized skeletal structures are unremarkable. Right-sided Port-A-Cath tip mid to distal SVC. IMPRESSION: No acute cardiopulmonary process. Electronically Signed   By: Sammie Bench M.D.   On: 09/17/2022 11:57

## 2022-10-05 NOTE — Progress Notes (Signed)
Patient has been examined by Dr. Katragadda, and vital signs and labs have been reviewed. ANC, Creatinine, LFTs, hemoglobin, and platelets are within treatment parameters per M.D. - pt may proceed with treatment.  Primary RN and pharmacy notified.  

## 2022-10-06 ENCOUNTER — Other Ambulatory Visit: Payer: Self-pay

## 2022-10-07 ENCOUNTER — Inpatient Hospital Stay: Payer: 59

## 2022-10-07 VITALS — BP 119/84 | HR 96 | Temp 98.6°F | Resp 18

## 2022-10-07 DIAGNOSIS — Z5111 Encounter for antineoplastic chemotherapy: Secondary | ICD-10-CM | POA: Diagnosis not present

## 2022-10-07 DIAGNOSIS — C20 Malignant neoplasm of rectum: Secondary | ICD-10-CM

## 2022-10-07 DIAGNOSIS — Z79899 Other long term (current) drug therapy: Secondary | ICD-10-CM | POA: Diagnosis not present

## 2022-10-07 DIAGNOSIS — Z95828 Presence of other vascular implants and grafts: Secondary | ICD-10-CM

## 2022-10-07 DIAGNOSIS — Z5189 Encounter for other specified aftercare: Secondary | ICD-10-CM | POA: Diagnosis not present

## 2022-10-07 MED ORDER — PEGFILGRASTIM-JMDB 6 MG/0.6ML ~~LOC~~ SOSY
6.0000 mg | PREFILLED_SYRINGE | Freq: Once | SUBCUTANEOUS | Status: AC
  Administered 2022-10-07: 6 mg via SUBCUTANEOUS
  Filled 2022-10-07: qty 0.6

## 2022-10-07 MED ORDER — HEPARIN SOD (PORK) LOCK FLUSH 100 UNIT/ML IV SOLN
500.0000 [IU] | Freq: Once | INTRAVENOUS | Status: AC | PRN
  Administered 2022-10-07: 500 [IU]

## 2022-10-07 MED ORDER — SODIUM CHLORIDE 0.9% FLUSH
10.0000 mL | INTRAVENOUS | Status: DC | PRN
  Administered 2022-10-07: 10 mL

## 2022-10-07 NOTE — Patient Instructions (Signed)
[facility]  Discharge Instructions: Thank you for choosing [facility] to provide your oncology and hematology care.  If you have a lab appointment with the Cancer Center, please come in thru the Main Entrance and check in at the main information desk.  Wear comfortable clothing and clothing appropriate for easy access to any Portacath or PICC line.   We strive to give you quality time with your provider. You may need to reschedule your appointment if you arrive late (15 or more minutes).  Arriving late affects you and other patients whose appointments are after yours.  Also, if you miss three or more appointments without notifying the office, you may be dismissed from the clinic at the provider's discretion.      For prescription refill requests, have your pharmacy contact our office and allow 72 hours for refills to be completed.    Today you received the following chemotherapy and/or immunotherapy agents 5FU pump d/c.        To help prevent nausea and vomiting after your treatment, we encourage you to take your nausea medication as directed.  BELOW ARE SYMPTOMS THAT SHOULD BE REPORTED IMMEDIATELY: *FEVER GREATER THAN 100.4 F (38 C) OR HIGHER *CHILLS OR SWEATING *NAUSEA AND VOMITING THAT IS NOT CONTROLLED WITH YOUR NAUSEA MEDICATION *UNUSUAL SHORTNESS OF BREATH *UNUSUAL BRUISING OR BLEEDING *URINARY PROBLEMS (pain or burning when urinating, or frequent urination) *BOWEL PROBLEMS (unusual diarrhea, constipation, pain near the anus) TENDERNESS IN MOUTH AND THROAT WITH OR WITHOUT PRESENCE OF ULCERS (sore throat, sores in mouth, or a toothache) UNUSUAL RASH, SWELLING OR PAIN  UNUSUAL VAGINAL DISCHARGE OR ITCHING   Items with * indicate a potential emergency and should be followed up as soon as possible or go to the Emergency Department if any problems should occur.  Please show the CHEMOTHERAPY ALERT CARD or IMMUNOTHERAPY ALERT CARD at check-in to the  Emergency Department and triage nurse.  Should you have questions after your visit or need to cancel or reschedule your appointment, please contact [facility] [phone]  and follow the prompts.  Office hours are 8:00 a.m. to 4:30 p.m. Monday - Friday. Please note that voicemails left after 4:00 p.m. may not be returned until the following business day.  We are closed weekends and major holidays. You have access to a nurse at all times for urgent questions. Please call the main number to the clinic [phone] and follow the prompts.  For any non-urgent questions, you may also contact your provider using MyChart. We now offer e-Visits for anyone 18 and older to request care online for non-urgent symptoms. For details visit [email or web address].   Also download the MyChart app! Go to the app store, search "MyChart", open the app, select [facility], and log in with your MyChart username and password.   

## 2022-10-07 NOTE — Progress Notes (Signed)
Patient presents today for pump d/c. Vital signs are stable. Port a cath site clean, dry, and intact. Port flushed with 10 mls of Normal Saline and 500 Units of Heparin. Needle removed intact. Band aid applied.  Troy Dillon presents today for injection per the provider's orders.  Fulphila administration without incident; injection site WNL; see MAR for injection details.  Patient tolerated procedure well and without incident.  No questions or complaints noted at this time.   Patient has no complaints at this time. Discharged from clinic ambulatory and in stable condition. Patient alert and oriented.

## 2022-10-13 ENCOUNTER — Ambulatory Visit: Payer: No Typology Code available for payment source

## 2022-10-13 ENCOUNTER — Ambulatory Visit (HOSPITAL_COMMUNITY)
Admission: RE | Admit: 2022-10-13 | Discharge: 2022-10-13 | Disposition: A | Discharge status: Home / Self Care | Payer: No Typology Code available for payment source | Source: Ambulatory Visit | Attending: Hematology | Admitting: Hematology

## 2022-10-13 DIAGNOSIS — K6389 Other specified diseases of intestine: Secondary | ICD-10-CM | POA: Diagnosis not present

## 2022-10-13 DIAGNOSIS — C20 Malignant neoplasm of rectum: Secondary | ICD-10-CM

## 2022-10-13 DIAGNOSIS — D492 Neoplasm of unspecified behavior of bone, soft tissue, and skin: Secondary | ICD-10-CM | POA: Diagnosis not present

## 2022-10-13 DIAGNOSIS — K6289 Other specified diseases of anus and rectum: Secondary | ICD-10-CM | POA: Diagnosis not present

## 2022-10-19 ENCOUNTER — Inpatient Hospital Stay (HOSPITAL_BASED_OUTPATIENT_CLINIC_OR_DEPARTMENT_OTHER): Payer: 59 | Admitting: Hematology

## 2022-10-19 VITALS — BP 121/85 | HR 88 | Temp 98.0°F | Resp 18 | Ht 70.0 in | Wt 169.8 lb

## 2022-10-19 DIAGNOSIS — C20 Malignant neoplasm of rectum: Secondary | ICD-10-CM

## 2022-10-19 DIAGNOSIS — Z5189 Encounter for other specified aftercare: Secondary | ICD-10-CM | POA: Diagnosis not present

## 2022-10-19 DIAGNOSIS — Z79899 Other long term (current) drug therapy: Secondary | ICD-10-CM | POA: Diagnosis not present

## 2022-10-19 DIAGNOSIS — Z5111 Encounter for antineoplastic chemotherapy: Secondary | ICD-10-CM | POA: Diagnosis not present

## 2022-10-19 NOTE — Patient Instructions (Signed)
[facility] at [facility] Discharge Instructions   You were seen and examined today by Dr. Delton Coombes.  He reviewed the results of your MRI. If the surgeon and the radiation oncologist are in agreement, then Dr. Raliegh Ip thinks you may bypass radiation and go straight to surgery.   We will arrange for you to see both Dr. Lisbeth Renshaw and Dr. Dema Severin.   Return as scheduled.    Thank you for choosing [place] at [facility] to provide your oncology and hematology care.  To afford each patient quality time with our provider, please arrive at least 15 minutes before your scheduled appointment time.   If you have a lab appointment with the [facility] please come in thru the Main Entrance and check in at the main information desk.  You need to re-schedule your appointment should you arrive 10 or more minutes late.  We strive to give you quality time with our providers, and arriving late affects you and other patients whose appointments are after yours.  Also, if you no show three or more times for appointments you may be dismissed from the clinic at the providers discretion.     Again, thank you for choosing [facility].  Our hope is that these requests will decrease the amount of time that you wait before being seen by our physicians.       _____________________________________________________________  Should you have questions after your visit to [facility], please contact our office at [phone] and follow the prompts.  Our office hours are 8:00 a.m. and 4:30 p.m. Monday - Friday.  Please note that voicemails left after 4:00 p.m. may not be returned until the following business day.  We are closed weekends and major holidays.  You do have access to a nurse 24-7, just call the main number to the clinic [phone] and do not press any options, hold on the line and a nurse will answer the phone.    For prescription refill requests,  have your pharmacy contact our office and allow 72 hours.    Due to Covid, you will need to wear a mask upon entering the hospital. If you do not have a mask, a mask will be given to you at the Main Entrance upon arrival. For doctor visits, patients may have 1 support person age 25 or older with them. For treatment visits, patients can not have anyone with them due to social distancing guidelines and our immunocompromised population.

## 2022-10-19 NOTE — Progress Notes (Signed)
[place], [place] [id number]    Clinic Day:  10/19/2022  Referring physician: [facility]  Patient Care Team: [facility] as PCP - General Janina Mayo, MD as PCP - Cardiology (Cardiology) Derek Jack, MD as Medical Oncologist (Medical Oncology) Brien Mates, RN as Oncology Nurse Navigator (Medical Oncology)   ASSESSMENT & PLAN:   Assessment: 1.  Stage IIIB (T3cN1) rectal adenocarcinoma, MSI stable: - Presentation with rectal bleeding - Colonoscopy (06/08/2022): Fungating partially obstructing large mass found in the rectum 11 cm from anal verge.  Partially circumferential involving one half of the lumen.  Mass measured 3 cm in length.  Diameter 2 cm.  Oozing present.  1 semipedunculated polyp (40 cm from anus) and 3 sessile polyps in the cecum removed. - Pathology (06/08/2022): Rectal mass 11 cm from anal verge-adenocarcinoma.  MMR preserved.  MSI-stable. - CEA (06/08/2022): 2.9 - MRI pelvis (06/10/2022): T3CN1, tumor extends into sigmoid from the high rectum, extension through muscularis propria, approximately 6 mm beyond the rectal wall.  Tumor begins in the highly portion of the rectum and extends into the sigmoid colon.  Extramural vascular invasion/tumor thrombus along the right lateral margin.  Shortest distance of tumor from mesorectal fascia: 15 mm.  Single high mesorectal or superior rectal lymph node measuring 9 mm.  Distance from tumor to internal anal sphincter is approximately 7-11 cm. - CT CAP (06/10/2022): Circumferential wall thickening of the upper/mid rectum, mildly enlarged high perirectal lymph nodes measuring up to 6 mm in short axis.  No evidence of distant metastatic disease in the chest, abdomen or pelvis. - Neoadjuvant FOLFOX 8 cycles from 06/29/2022 through 10/05/2022   2.  Social/family history: - He lives at home with his wife Troy Dillon.  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   Denies any chemical exposure.  Does not smoke cigarettes but dips tobacco. - Father had colon cancer at age 32. - Paternal grandmother died of colon cancer. - Paternal aunt had colon cancer. - Mother had "male cancer" and underwent hysterectomy.  Plan: 1.  Stage IIIb (T3CN1) rectal adenocarcinoma, MSI-stable: - MRI pelvis (10/13/2022): Evidence of decreased extramural vascular invasion along the right lateral margin.  Tumor length 3.4 cm, previously 4.5 cm.  Significant interval decreased thickening of high rectum/low sigmoid colon.  Mesorectal lymph nodes 7 mm, previously 9 mm and a 4 mm node. - I have called and talked to the radiologist who read the MRI.  From the size of the tumor, there is at least 20% clinical improvement. - We have talked about prospect trial in which majority of the patients who got 20% improvement were able to forego radiation therapy. - I will discuss this with Dr. Lisbeth Renshaw and Dr. Dema Severin to get their opinion.  If we will plan on radiation, we will start him on Xeloda 1500 mg twice daily Monday through Friday. - If he proceeds with surgery, I will see him 4 weeks after surgery.   2.  Nausea/vomiting: - Continue Zofran and Phenergan as needed.   3.  Peripheral neuropathy: - Cold sensitivity lasted more than 1 week after last cycle. - Denies any numbness in the right foot.    No orders of the defined types were placed in this encounter.     Beverly Gust Oliver,acting as a scribe for Derek Jack, MD.,have documented all relevant documentation on the behalf of Derek Jack, MD,as directed by  Derek Jack, MD while in the  presence of Derek Jack, MD.   I, Derek Jack MD, have reviewed the above documentation for accuracy and completeness, and I agree with the above.   [name]   2/26/202411:16 AM  CHIEF COMPLAINT:   Diagnosis: stage III rectal cancer     Cancer Staging  Rectal carcinoma [facility]) Staging form:  Colon and Rectum, AJCC 8th Edition - Clinical stage from 06/18/2022: Stage IIIB (cT3, cN1, cM0) - Unsigned    Prior Therapy: None  Current Therapy:   FOLFOX     HISTORY OF PRESENT ILLNESS:   Oncology History  Rectal carcinoma ([place])  06/17/2022 Initial Diagnosis   Rectal carcinoma ([place])   06/29/2022 -  Chemotherapy   Patient is on Treatment Plan : COLORECTAL FOLFOX q14d x 4 months      Genetic Testing   [facility] Common Cancer Panel+RNA was Negative. Report date is 08/29/2022.  The Common Hereditary Cancers Panel offered by [facility] includes sequencing and/or deletion duplication testing of the following 48 genes: APC, ATM, AXIN2, BAP1, BARD1, BMPR1A, BRCA1, BRCA2, BRIP1, CDH1, CDK4, CDKN2A (p14ARF and p16INK4a only), CHEK2, CTNNA1, DICER1, EPCAM (Deletion/duplication testing only), FH, GREM1 (promoter region duplication testing only), HOXB13, KIT, MBD4, MEN1, MLH1, MSH2, MSH3, MSH6, MUTYH, NF1, NHTL1, PALB2, PDGFRA, PMS2, POLD1, POLE, PTEN, RAD51C, RAD51D, SDHA (sequencing analysis only except exon 14), SDHB, SDHC, SDHD, SMAD4, SMARCA4. STK11, TP53, TSC1, TSC2, and VHL.      INTERVAL HISTORY:   [name] is a 52 y.o. male presenting to clinic today for follow up of stage III rectal cancer  . He was last seen by me on 10/05/2022.  Today, he states that he is doing well overall. His appetite level is at 70%. His energy level is at 50%. This time after chemo his symptoms lasted longer. He had cold sensitivity, diarrhea, neuropathy, fatigue, and nauseas.     PAST MEDICAL HISTORY:   Past Medical History: Past Medical History:  Diagnosis Date   Rectal bleeding    Rectal cancer ([place]) 06/08/2022    Surgical History: Past Surgical History:  Procedure Laterality Date   COLONOSCOPY  06/08/2022   EYE SURGERY     FRACTURE SURGERY     Right Femur- Cables in bone remain in place, rod removed in 2000   IR IMAGING GUIDED PORT INSERTION  06/22/2022   ORTHOPEDIC SURGERY      Social  History: Social History   Socioeconomic History   Marital status: Married    Spouse name: Not on file   Number of children: Not on file   Years of education: Not on file   Highest education level: Not on file  Occupational History   Not on file  Tobacco Use   Smoking status: Former    Types: Cigarettes    Quit date: 2003    Years since quitting: 21.1   Smokeless tobacco: Current    Types: Snuff   Tobacco comments:    Some Dipping  Vaping Use   Vaping Use: Never used  Substance and Sexual Activity   Alcohol use: Yes    Comment: occasionally   Drug use: Never   Sexual activity: Not on file  Other Topics Concern   Not on file  Social History Narrative   Not on file   Social Determinants of Health   Financial Resource Strain: Not on file  Food Insecurity: Not on file  Transportation Needs: Not on file  Physical Activity: Not on file  Stress: Not on file  Social Connections: Not on  file  Intimate Partner Violence: Not on file    Family History: Family History  Problem Relation Age of Onset   Cancer Mother 40 - 24       GYN malignancy details unknown, TAH/BSO   Colon cancer Father 12   Heart attack Father    Colon cancer Paternal Aunt 15   Colon cancer Paternal Grandmother 3    Current Medications:  Current Outpatient Medications:    dextrose 5 % SOLN 1,000 mL with fluorouracil 5 GM/100ML SOLN, Inject into the vein over 48 hr. Every 14 days, Disp: , Rfl:    FLUOROURACIL IV, Inject into the vein every 14 (fourteen) days., Disp: , Rfl:    ibuprofen (ADVIL) 200 MG tablet, Take 200 mg by mouth every 6 (six) hours as needed for moderate pain., Disp: , Rfl:    LEUCOVORIN CALCIUM IV, Inject into the vein every 14 (fourteen) days., Disp: , Rfl:    lidocaine-prilocaine (EMLA) cream, Apply a small amount to port a cath site and cover with plastic wrap one hour prior to infusion appointments, Disp: 30 g, Rfl: 3   megestrol (MEGACE) 400 MG/10ML suspension, Take 10 mLs  (400 mg total) by mouth 2 (two) times daily., Disp: 480 mL, Rfl: 3   Multiple Vitamin (MULTIVITAMIN) tablet, Take 1 tablet by mouth daily., Disp: , Rfl:    ondansetron (ZOFRAN) 8 MG tablet, Take 1 tablet (8 mg total) by mouth every 8 (eight) hours as needed., Disp: 60 tablet, Rfl: 2   OXALIPLATIN IV, Inject into the vein every 14 (fourteen) days., Disp: , Rfl:    prochlorperazine (COMPAZINE) 10 MG tablet, Take 1 tablet (10 mg total) by mouth every 6 (six) hours as needed for nausea or vomiting., Disp: 30 tablet, Rfl: 1   promethazine (PHENERGAN) 25 MG suppository, Place 1 suppository (25 mg total) rectally every 6 (six) hours as needed for nausea or vomiting., Disp: 120 each, Rfl: 2   Salicylic Acid, Acne, (SALICYLIC ACID EX), Apply 1 Application topically daily as needed (acne)., Disp: , Rfl:    sodium chloride (OCEAN) 0.65 % SOLN nasal spray, Place 1 spray into both nostrils as needed for congestion., Disp: , Rfl:    Allergies: Allergies  Allergen Reactions   Hydrocodone Itching   Latex     Eye irritation     REVIEW OF SYSTEMS:   Review of Systems  Constitutional:  Negative for chills, fatigue and fever.  HENT:   Negative for lump/mass, mouth sores, nosebleeds, sore throat and trouble swallowing.   Eyes:  Negative for eye problems.  Respiratory:  Negative for cough and shortness of breath.   Cardiovascular:  Negative for chest pain, leg swelling and palpitations.  Gastrointestinal:  Negative for abdominal pain, constipation, diarrhea, nausea and vomiting.  Genitourinary:  Negative for bladder incontinence, difficulty urinating, dysuria, frequency, hematuria and nocturia.   Musculoskeletal:  Negative for arthralgias, back pain, flank pain, myalgias and neck pain.  Skin:  Negative for itching and rash.  Neurological:  Negative for dizziness, headaches and numbness.  Hematological:  Does not bruise/bleed easily.  Psychiatric/Behavioral:  Negative for depression, sleep disturbance and  suicidal ideas. The patient is not nervous/anxious.   All other systems reviewed and are negative.    VITALS:   Blood pressure 121/85, pulse 88, temperature 98 F (36.7 C), temperature source Oral, resp. rate 18, height '5\' 10"'$  (1.778 m), weight 169 lb 12.8 oz (77 kg), SpO2 99 %.  Wt Readings from Last 3 Encounters:  10/19/22 169  lb 12.8 oz (77 kg)  10/05/22 172 lb 3.2 oz (78.1 kg)  09/21/22 172 lb (78 kg)    Body mass index is 24.36 kg/m.  Performance status (ECOG): 0 - Asymptomatic  PHYSICAL EXAM:   Physical Exam Vitals and nursing note reviewed. Exam conducted with a chaperone present.  Constitutional:      Appearance: Normal appearance.  Cardiovascular:     Rate and Rhythm: Normal rate and regular rhythm.     Pulses: Normal pulses.     Heart sounds: Normal heart sounds.  Pulmonary:     Effort: Pulmonary effort is normal.     Breath sounds: Normal breath sounds.  Abdominal:     Palpations: Abdomen is soft. There is no hepatomegaly, splenomegaly or mass.     Tenderness: There is no abdominal tenderness.  Musculoskeletal:     Right lower leg: No edema.     Left lower leg: No edema.  Lymphadenopathy:     Cervical: No cervical adenopathy.     Right cervical: No superficial, deep or posterior cervical adenopathy.    Left cervical: No superficial, deep or posterior cervical adenopathy.     Upper Body:     Right upper body: No supraclavicular or axillary adenopathy.     Left upper body: No supraclavicular or axillary adenopathy.  Neurological:     General: No focal deficit present.     Mental Status: He is alert and oriented to person, place, and time.  Psychiatric:        Mood and Affect: Mood normal.        Behavior: Behavior normal.     LABS:      Latest Ref Rng & Units 10/05/2022    9:33 AM 09/21/2022    9:17 AM 09/17/2022   11:27 AM  CBC  WBC 4.0 - 10.5 K/uL 5.1  5.8  13.3   Hemoglobin 13.0 - 17.0 g/dL 13.4  13.8  13.3   Hematocrit 39.0 - 52.0 % 38.3  38.6   37.1   Platelets 150 - 400 K/uL 136  137  118       Latest Ref Rng & Units 10/05/2022    9:33 AM 09/21/2022    9:17 AM 09/17/2022   11:27 AM  CMP  Glucose 70 - 99 mg/dL 121  142  103   BUN 6 - 20 mg/dL '15  15  11   '$ Creatinine 0.61 - 1.24 mg/dL 0.98  0.96  0.88   Sodium 135 - 145 mmol/L 137  135  133   Potassium 3.5 - 5.1 mmol/L 4.1  3.7  3.9   Chloride 98 - 111 mmol/L 109  107  105   CO2 22 - 32 mmol/L '21  21  20   '$ Calcium 8.9 - 10.3 mg/dL 8.6  8.7  8.9   Total Protein 6.5 - 8.1 g/dL 7.1  7.2  7.1   Total Bilirubin 0.3 - 1.2 mg/dL 0.7  0.7  0.8   Alkaline Phos 38 - 126 U/L 161  179  206   AST 15 - 41 U/L 41  36  37   ALT 0 - 44 U/L 39  35  37      No results found for: "CEA1", "CEA" / No results found for: "CEA1", "CEA" No results found for: "PSA1" No results found for: "[id number]" No results found for: "CAN125"  No results found for: "TOTALPROTELP", "ALBUMINELP", "A1GS", "A2GS", "BETS", "BETA2SER", "GAMS", "MSPIKE", "SPEI" No results found for: "TIBC", "FERRITIN", "IRONPCTSAT"  No results found for: "LDH"   STUDIES:   MR PELVIS WO CM RECTAL CA STAGING  Result Date: 10/14/2022 CLINICAL DATA:  Rectal cancer, monitor/restaging. EXAM: MRI PELVIS WITHOUT CONTRAST TECHNIQUE: Multiplanar multisequence MR imaging of the pelvis was performed. No intravenous contrast was administered. Ultrasound gel was administered per rectum to optimize tumor evaluation. COMPARISON:  MRI June 10, 2022. FINDINGS: TUMOR LOCATION Tumor distance from Anal Verge/Skin surface: 13.8 cm Tumor distance to Internal Anal sphincter: 9.7 cm TUMOR DESCRIPTION Circumferential extent: Significant interval decrease thickening of the high rectum/low sigmoid colon which is again slightly eccentric to the right. Evaluation with DWI/ADC is limited by the fact that the tumor is so close to the margins of the field of view. Within this context there does appear to be persistent reduced ADC increased DWI signal within portions of  the tumor for instance on image 4/7. Tumor Size and volume: Tumor length is difficult to fully evaluate given the lack of sigmoid colonic distension but appears to measure 3.4 cm in length on image 22/9, previously 4.5 cm. T - CATEGORY Extension through Muscularis Propria: Yes 4-5 mm=T3b Shortest Distance of any tumor/node from Mesorectal fascia: 15 mm Extramural Vascular Invasion/Tumor Thrombus: Evidence of decreased extramural vascular invasion along the right lateral margin. Invasion of Anterior Peritoneal Reflection: No Involvement of Adjacent Organs or Pelvic Sidewall: No Levator Ani Involvement: No N - CATEGORY Mesorectal Lymph Nodes >=91m: Previously indexed 9 mm high mesorectal lymph node now measures 7 mm on image 28/10. Additionally there is a 4 mm mesorectal lymph node on image 28/10 Extra-mesorectal Lymphadenopathy: No  Yes = N2 Other: None. IMPRESSION: *Decreased eccentric high rectal/low sigmoid wall thickening compatible with treatment response. Evaluation for restricted diffusion is limited on this examination secondary to technique however there is probable mild reduced ADC and increased DWI signal within portions of the tumor suggestive of residual disease. Consider further evaluation with colonoscopy. *Decreased size of the high mesorectal or superior rectal lymph node. Electronically Signed   By: JDahlia BailiffM.D.   On: 10/14/2022 12:14

## 2022-10-20 ENCOUNTER — Encounter: Payer: Self-pay | Admitting: Hematology

## 2022-10-21 ENCOUNTER — Other Ambulatory Visit: Payer: Self-pay

## 2022-10-22 ENCOUNTER — Encounter: Payer: Self-pay | Admitting: Hematology

## 2022-10-25 ENCOUNTER — Encounter: Payer: Self-pay | Admitting: Hematology

## 2022-10-26 ENCOUNTER — Telehealth: Payer: Self-pay | Admitting: *Deleted

## 2022-10-26 ENCOUNTER — Other Ambulatory Visit: Payer: Self-pay | Admitting: *Deleted

## 2022-10-26 MED ORDER — ESCITALOPRAM OXALATE 10 MG PO TABS: 10.0000 mg | ORAL_TABLET | Freq: Every day | ORAL | 3 refills | Status: DC

## 2022-10-26 NOTE — Telephone Encounter (Signed)
Per Lattie Haw, patient's wife, he has battled with depression and moderate mood swings since starting treatment.  In addition, he has lost his mother in that timeframe.  Per Dr. Delton Coombes, will send Lexapro 10 mg for him to take daily.  Advised that this medication will likely take up to 4 weeks before he notices a difference.

## 2022-10-26 NOTE — Progress Notes (Signed)
Radiation Oncology         [phone] ________________________________  Name: Troy Dillon        MRN: [id number]  Date of Service: 10/29/2022 DOB: 04/16/1971  [id number], [facility]  Derek Jack, MD     REFERRING PHYSICIAN: Derek Jack, MD   DIAGNOSIS: The encounter diagnosis was Rectal carcinoma [facility]).   HISTORY OF PRESENT ILLNESS: Troy Dillon is a 52 y.o. male with a diagnosis of rectal carcinoma. He presented with rectal bleeding and a colonoscopy on 06/08/22 showed a mass in the proximal rectum that was biopsied as well as multiple polyps. His polyps were consistent with tubular adenomas, and the mass in the rectum showed an adenocarcinoma. He underwent staging CT on 06/10/22 of the CAP that was consistent with thickening of the rectum, high perirectal nodes felt to be nonspecific, and no metastatic disease was noted. An MRI pelvis for staging on 06/10/22 showed a cT3N1 lesion extending into the high rectum with EMVI, and was noted 7-11 cm from the internal anal sphincter. He has established surgical care closer to home with Dr. Dema Severin, and began neoadjuvant FOLFOX with Dr. Delton Coombes on 06/30/23, and completed 8 cycles on 10/07/22. He had an interval MRI on 10/13/22 and this showed decreased evidence of extramural vascular invasion along the right lateral margin.  Tumor length improved to 3.4 cm previously 4.5 cm, there is still extension through the muscularis propria and the closest distance of the tumor and/or node to the mesorectal fascia was 15 mm.  Previously a high mesorectal lymph node measuring 9 mm now measured 7 mm and an additional 4 mm mesorectal lymph node measuring 4 mm.  There was no evidence of extra mesorectal adenopathy. He's seen today to discuss treatment recommendations regarding radiation.     PREVIOUS RADIATION THERAPY: No   PAST MEDICAL HISTORY:  Past Medical History:  Diagnosis Date   Rectal cancer ([place]) 06/08/2022       PAST SURGICAL  HISTORY: Past Surgical History:  Procedure Laterality Date   COLONOSCOPY  06/08/2022   EYE SURGERY     FRACTURE SURGERY     Right Femur- Cables in bone remain in place, rod removed in 2000   IR IMAGING GUIDED PORT INSERTION  06/22/2022   ORTHOPEDIC SURGERY       FAMILY HISTORY:  Family History  Problem Relation Age of Onset   Cancer Mother 55 - 68       GYN malignancy details unknown, TAH/BSO   Colon cancer Father 43   Heart attack Father    Colon cancer Paternal Aunt 83   Colon cancer Paternal Grandmother 6     SOCIAL HISTORY:  reports that he quit smoking about 21 years ago. His smoking use included cigarettes. His smokeless tobacco use includes snuff. He reports current alcohol use. He reports that he does not use drugs. The patient is married and lives in [place], [place]. He works for a [employer], clinical (histocompatibility and immunogenetics) and is accompanied by his wife.   ALLERGIES: Hydrocodone and Latex   MEDICATIONS:  Current Outpatient Medications  Medication Sig Dispense Refill   dextrose 5 % SOLN 1,000 mL with fluorouracil 5 GM/100ML SOLN Inject into the vein over 48 hr. Every 14 days     escitalopram (LEXAPRO) 10 MG tablet Take 1 tablet (10 mg total) by mouth daily. 30 tablet 3   FLUOROURACIL IV Inject into the vein every 14 (fourteen) days.     ibuprofen (ADVIL) 200 MG tablet Take 200 mg by mouth  every 6 (six) hours as needed for moderate pain.     LEUCOVORIN CALCIUM IV Inject into the vein every 14 (fourteen) days.     lidocaine-prilocaine (EMLA) cream Apply a small amount to port a cath site and cover with plastic wrap one hour prior to infusion appointments 30 g 3   megestrol (MEGACE) 400 MG/10ML suspension Take 10 mLs (400 mg total) by mouth 2 (two) times daily. 480 mL 3   Multiple Vitamin (MULTIVITAMIN) tablet Take 1 tablet by mouth daily.     ondansetron (ZOFRAN) 8 MG tablet Take 1 tablet (8 mg total) by mouth every 8 (eight) hours as needed. 60 tablet 2   OXALIPLATIN IV Inject into  the vein every 14 (fourteen) days.     prochlorperazine (COMPAZINE) 10 MG tablet Take 1 tablet (10 mg total) by mouth every 6 (six) hours as needed for nausea or vomiting. 30 tablet 1   promethazine (PHENERGAN) 25 MG suppository Place 1 suppository (25 mg total) rectally every 6 (six) hours as needed for nausea or vomiting. [id number] each 2   Salicylic Acid, Acne, (SALICYLIC ACID EX) Apply 1 Application topically daily as needed (acne).     sodium chloride (OCEAN) 0.65 % SOLN nasal spray Place 1 spray into both nostrils as needed for congestion.     No current facility-administered medications for this encounter.     REVIEW OF SYSTEMS: On review of systems, the patient reports that he is doing pretty well but has struggled with poor appetite, fatigue, and changes in taste during chemotherapy. He also struggled with PPE and mucositis during therapy. He did have diarrhea as well up until about last week. No rectal bleeding is verbalized. No other complaints are noted.      PHYSICAL EXAM:  Wt Readings from Last 3 Encounters:  10/29/22 172 lb 4 oz (78.1 kg)  10/19/22 169 lb 12.8 oz (77 kg)  10/05/22 172 lb 3.2 oz (78.1 kg)   Temp Readings from Last 3 Encounters:  10/29/22 (!) 97.1 F (36.2 C) (Temporal)  10/19/22 98 F (36.7 C) (Oral)  10/07/22 98.6 F (37 C) (Tympanic)   BP Readings from Last 3 Encounters:  10/29/22 116/84  10/19/22 121/85  10/07/22 119/84   Pulse Readings from Last 3 Encounters:  10/29/22 82  10/19/22 88  10/07/22 96   Pain Assessment Pain Score: 0-No pain/10  In general this is a well appearing caucasian male in no acute distress. He's alert and oriented x4 and appropriate throughout the examination. Cardiopulmonary assessment is negative for acute distress and he exhibits normal effort.     ECOG = 1  0 - Asymptomatic (Fully active, able to carry on all predisease activities without restriction)  1 - Symptomatic but completely ambulatory (Restricted in  physically strenuous activity but ambulatory and able to carry out work of a light or sedentary nature. For example, light housework, office work)  2 - Symptomatic, <50% in bed during the day (Ambulatory and capable of all self care but unable to carry out any work activities. Up and about more than 50% of waking hours)  3 - Symptomatic, >50% in bed, but not bedbound (Capable of only limited self-care, confined to bed or chair 50% or more of waking hours)  4 - Bedbound (Completely disabled. Cannot carry on any self-care. Totally confined to bed or chair)  5 - Death   Eustace Pen MM, Creech RH, Tormey DC, et al. [phone]). "Toxicity and response criteria of the [facility]". Am.  J. Clin. Oncol. 5 (6): 649-55    LABORATORY DATA:  Lab Results  Component Value Date   WBC 5.1 10/05/2022   HGB 13.4 10/05/2022   HCT 38.3 (L) 10/05/2022   MCV 96.2 10/05/2022   PLT 136 (L) 10/05/2022   Lab Results  Component Value Date   NA 137 10/05/2022   K 4.1 10/05/2022   CL 109 10/05/2022   CO2 21 (L) 10/05/2022   Lab Results  Component Value Date   ALT 39 10/05/2022   AST 41 10/05/2022   ALKPHOS 161 (H) 10/05/2022   BILITOT 0.7 10/05/2022      RADIOGRAPHY: MR PELVIS WO CM RECTAL CA STAGING  Result Date: 10/14/2022 CLINICAL DATA:  Rectal cancer, monitor/restaging. EXAM: MRI PELVIS WITHOUT CONTRAST TECHNIQUE: Multiplanar multisequence MR imaging of the pelvis was performed. No intravenous contrast was administered. Ultrasound gel was administered per rectum to optimize tumor evaluation. COMPARISON:  MRI June 10, 2022. FINDINGS: TUMOR LOCATION Tumor distance from Anal Verge/Skin surface: 13.8 cm Tumor distance to Internal Anal sphincter: 9.7 cm TUMOR DESCRIPTION Circumferential extent: Significant interval decrease thickening of the high rectum/low sigmoid colon which is again slightly eccentric to the right. Evaluation with DWI/ADC is limited by the fact that the tumor is so close  to the margins of the field of view. Within this context there does appear to be persistent reduced ADC increased DWI signal within portions of the tumor for instance on image 4/7. Tumor Size and volume: Tumor length is difficult to fully evaluate given the lack of sigmoid colonic distension but appears to measure 3.4 cm in length on image 22/9, previously 4.5 cm. T - CATEGORY Extension through Muscularis Propria: Yes 4-5 mm=T3b Shortest Distance of any tumor/node from Mesorectal fascia: 15 mm Extramural Vascular Invasion/Tumor Thrombus: Evidence of decreased extramural vascular invasion along the right lateral margin. Invasion of Anterior Peritoneal Reflection: No Involvement of Adjacent Organs or Pelvic Sidewall: No Levator Ani Involvement: No N - CATEGORY Mesorectal Lymph Nodes >=34m: Previously indexed 9 mm high mesorectal lymph node now measures 7 mm on image 28/10. Additionally there is a 4 mm mesorectal lymph node on image 28/10 Extra-mesorectal Lymphadenopathy: No  Yes = N2 Other: None. IMPRESSION: *Decreased eccentric high rectal/low sigmoid wall thickening compatible with treatment response. Evaluation for restricted diffusion is limited on this examination secondary to technique however there is probable mild reduced ADC and increased DWI signal within portions of the tumor suggestive of residual disease. Consider further evaluation with colonoscopy. *Decreased size of the high mesorectal or superior rectal lymph node. Electronically Signed   By: JDahlia BailiffM.D.   On: 10/14/2022 12:14       IMPRESSION/PLAN: 1. Stage IIIB, cT3N1M0, adenocarcinoma of the proximal rectum. Dr. MLisbeth Renshawdiscusses the patient's course to date, and reviews the nature of rectal carcinoma.  The patient has had improvement in his disease since his chemotherapy.  He is getting over some of the side effects of his most recent treatments.  With the improvement, discussion with medical oncology and colorectal surgery has also  been considering his case as it relates to recent PROSPECT trial protocol.  We discussed NCCN guidelines as well and reviewed the utility of chemoradiation versus considering that his tumor is proximal and that he does not have extra measle adenopathy.  The patient is open to the idea of chemoradiation but is also considering the options of pursuing surgery at this juncture.  We discussed the risks, benefits, short, and long term effects of radiotherapy,  as well as the curative intent, and the patient is also interested in further discussion with Dr. Dema Severin before making a decision.  We did go ahead and reserve him simulation time next week after he meets with Dr. Dema Severin and would proceed with chemoradiation if he desired this the week of 11/09/2022. 2. 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 We discussed the importance of evaluation with physical therapy prior to pelvic radiation. He will follow up with PT.    In a visit lasting 60 minutes, greater than 50% of the time was spent face to face discussing the patient's condition, in preparation for the discussion, and coordinating the patient's care.   The above documentation reflects my direct findings during this shared patient visit. Please see the separate note by Dr. Lisbeth Dillon on this date for the remainder of the patient's plan of care.    Troy Dillon, [facility]   **Disclaimer: This note was dictated with voice recognition software. Similar sounding words can inadvertently be transcribed and this note may contain transcription errors which may not have been corrected upon publication of note.**

## 2022-10-27 ENCOUNTER — Encounter: Payer: Self-pay | Admitting: Hematology

## 2022-10-28 ENCOUNTER — Encounter: Payer: Self-pay | Admitting: Radiation Oncology

## 2022-10-28 NOTE — Progress Notes (Signed)
Follow-up-new nursing interview for Rectal carcinoma ([place]).  Patient identity verified. Patient reports doing well. No issues conveyed at this time.  Meaningful use complete.  Vitals- BP 116/84 (BP Location: Left Arm, Patient Position: Sitting, Cuff Size: Normal)   Pulse 82   Temp (!) 97.1 F (36.2 C) (Temporal)   Resp 18   Ht '5\' 10"'$  (1.778 m)   Wt 172 lb 4 oz (78.1 kg)   SpO2 100%   BMI 24.72 kg/m    This concludes the interview.   Leandra Kern, LPN

## 2022-10-29 ENCOUNTER — Encounter: Payer: Self-pay | Admitting: Radiation Oncology

## 2022-10-29 ENCOUNTER — Ambulatory Visit
Admission: RE | Admit: 2022-10-29 | Discharge: 2022-10-29 | Disposition: A | Discharge status: Home / Self Care | Payer: No Typology Code available for payment source | Source: Ambulatory Visit | Attending: Radiation Oncology | Admitting: Radiation Oncology

## 2022-10-29 VITALS — BP 116/84 | HR 82 | Temp 97.1°F | Resp 18 | Ht 70.0 in | Wt 172.2 lb

## 2022-10-29 DIAGNOSIS — Z800 Family history of malignant neoplasm of digestive organs: Secondary | ICD-10-CM | POA: Insufficient documentation

## 2022-10-29 DIAGNOSIS — Z79899 Other long term (current) drug therapy: Secondary | ICD-10-CM | POA: Insufficient documentation

## 2022-10-29 DIAGNOSIS — C20 Malignant neoplasm of rectum: Secondary | ICD-10-CM | POA: Diagnosis not present

## 2022-10-29 DIAGNOSIS — Z87891 Personal history of nicotine dependence: Secondary | ICD-10-CM | POA: Insufficient documentation

## 2022-10-30 ENCOUNTER — Encounter: Payer: Self-pay | Admitting: Radiation Oncology

## 2022-11-02 ENCOUNTER — Encounter: Payer: Self-pay | Admitting: Hematology

## 2022-11-05 ENCOUNTER — Other Ambulatory Visit: Payer: Self-pay | Admitting: Radiation Oncology

## 2022-11-05 ENCOUNTER — Ambulatory Visit
Admission: RE | Admit: 2022-11-05 | Discharge: 2022-11-05 | Disposition: A | Discharge status: Home / Self Care | Payer: 59 | Source: Ambulatory Visit | Attending: Radiation Oncology | Admitting: Radiation Oncology

## 2022-11-05 DIAGNOSIS — C20 Malignant neoplasm of rectum: Secondary | ICD-10-CM | POA: Insufficient documentation

## 2022-11-05 DIAGNOSIS — Z510 Encounter for antineoplastic radiation therapy: Secondary | ICD-10-CM | POA: Insufficient documentation

## 2022-11-05 NOTE — Progress Notes (Signed)
I spoke with the patient and his wife today and simulation.  They have decided to proceed with chemoradiation after discussing as well with Dr. Dema Severin.  We reviewed consent and a copy was provided to the patient.  There are appointments that are scheduled into April and further that are likely related to the treatment plan that had previously been set in place.  I will reach out to Dr. Delton Coombes as well is Dr. Dema Severin to communicate a conclusion to the plan.

## 2022-11-10 ENCOUNTER — Encounter: Payer: Self-pay | Admitting: Hematology

## 2022-11-10 ENCOUNTER — Telehealth: Payer: Self-pay

## 2022-11-10 ENCOUNTER — Other Ambulatory Visit: Payer: Self-pay | Admitting: *Deleted

## 2022-11-10 ENCOUNTER — Other Ambulatory Visit: Payer: Self-pay

## 2022-11-10 ENCOUNTER — Other Ambulatory Visit (HOSPITAL_COMMUNITY): Payer: Self-pay

## 2022-11-10 ENCOUNTER — Telehealth: Payer: Self-pay | Admitting: Pharmacist

## 2022-11-10 DIAGNOSIS — Z510 Encounter for antineoplastic radiation therapy: Secondary | ICD-10-CM | POA: Diagnosis not present

## 2022-11-10 DIAGNOSIS — C20 Malignant neoplasm of rectum: Secondary | ICD-10-CM | POA: Diagnosis not present

## 2022-11-10 MED ORDER — CAPECITABINE 500 MG PO TABS
ORAL_TABLET | ORAL | 2 refills | Status: DC
  Filled 2022-11-10: qty 168, fill #0

## 2022-11-10 MED ORDER — CAPECITABINE 500 MG PO TABS
1500.0000 mg | ORAL_TABLET | Freq: Two times a day (BID) | ORAL | 0 refills | Status: DC
  Filled 2022-11-10 (×2): qty 168, 28d supply, fill #0

## 2022-11-10 NOTE — Telephone Encounter (Signed)
Patient successfully OnBoarded and scheduled to pick up Capecitabine from [facility] on Wednesday 11/11/22.    Troy Dillon, [facility] Patient [place]  [phone] (phone) [phone] (fax) 11/10/2022 10:19 AM

## 2022-11-10 NOTE — Telephone Encounter (Signed)
Oral Oncology Patient Advocate Encounter  Prior Authorization for Capecitabine has been approved.    PA# [id number]  Effective dates: 11/10/22 through 11/10/23  Patients co-pay is $0.00.  Patient may fill first fill with [facility] before being required to fill at [facility] with [employer].    Berdine Addison, [facility] Patient [place]  [phone] (phone) [phone] (fax) 11/10/2022 8:33 AM

## 2022-11-10 NOTE — Telephone Encounter (Signed)
Oral Oncology Patient Advocate Encounter  New authorization   Received notification that prior authorization for Capecitabine is required.   PA submitted on 11/10/22  Key [id number]  Status is pending     Troy Dillon, [place] Patient [place]  [phone] (phone) [phone] (fax) 11/10/2022 8:31 AM

## 2022-11-10 NOTE — Telephone Encounter (Signed)
Oral Chemotherapy Pharmacist Encounter  Patient will pick up his capecitabine from [facility] tomorrow 11/11/22.  Patient Education I spoke with patient for overview of new oral chemotherapy medication: Xeloda (capecitabine) for the treatment of stage IIIB rectal cancer in conjunction with XRT, planned duration until the end of radiation.   Counseled patient on administration, dosing, side effects, monitoring, drug-food interactions, safe handling, storage, and disposal. Patient will take 3 tablets (1,500 mg total) by mouth 2 (two) times daily after a meal. Take Monday-Friday. Take only on days of radiation.   Side effects include but not limited to: diarrhea, hand-foot syndrome, mouth sores, edema, decreased wbc, fatigue, N/V Diarrhea: patient knows to use loperamide as needed and call the office if he is having 4 or more loose stools per day Hand-foot syndrome: Recommended the use of [place]der[place] Smooth Extra Care 20 Mouth sores: patient will call and request magic mouth wash is needed.    Reviewed with patient importance of keeping a medication schedule and plan for any missed doses.  After discussion with patient no patient barriers to medication adherence identified.   Mr. Ekker voiced understanding and appreciation. All questions answered. Medication handout provided.  Provided patient with [facility] phone number. Patient knows to call the office with questions or concerns. Oral Chemotherapy Navigation Clinic will continue to follow.  Darl Pikes, PharmD, BCPS, BCOP, CPP Hematology/Oncology Clinical Pharmacist Practitioner [place]/DB/AP Oral [facility] [phone]  11/10/2022 10:34 AM

## 2022-11-10 NOTE — Telephone Encounter (Signed)
Oral Oncology Pharmacist Encounter  Received new prescription for Xeloda (capecitabine) for the treatment of stage IIIB rectal cancer in conjunction with XRT, planned duration until the end of radiation.  CMP from 10/05/22 assessed, no relevant lab abnormalities. Prescription dose and frequency assessed.   Current medication list in Epic reviewed, one relevant DDIs with capecitabine identified: Escitalopram: Fluorouracil Products may enhance the QTc-prolonging effect of QT-prolonging Antidepressants. ECG on 09/18/22 showed a QTc of 423.The recommendation is to monitor for QTc interval prolongation and ventricular arrhythmias when used in combination. Could consider repeating ECG once patient is on combination therapy.  Evaluated chart and no patient barriers to medication adherence identified.   Prescription has been e-scribed to the [facility] for benefits analysis and approval.  Oral Oncology Clinic will continue to follow for insurance authorization, copayment issues, initial counseling and start date.   Darl Pikes, PharmD, BCPS, BCOP, CPP Hematology/Oncology Clinical Pharmacist Practitioner [place]/DB/AP Oral [facility] [phone]  11/10/2022 9:42 AM

## 2022-11-11 ENCOUNTER — Ambulatory Visit
Admission: RE | Admit: 2022-11-11 | Discharge: 2022-11-11 | Disposition: A | Discharge status: Home / Self Care | Payer: 59 | Source: Ambulatory Visit | Attending: Radiation Oncology | Admitting: Radiation Oncology

## 2022-11-11 ENCOUNTER — Other Ambulatory Visit: Payer: Self-pay

## 2022-11-11 DIAGNOSIS — Z510 Encounter for antineoplastic radiation therapy: Secondary | ICD-10-CM | POA: Diagnosis not present

## 2022-11-11 DIAGNOSIS — C20 Malignant neoplasm of rectum: Secondary | ICD-10-CM | POA: Diagnosis not present

## 2022-11-11 LAB — RAD ONC ARIA SESSION SUMMARY
Course Elapsed Days: 0
Plan Fractions Treated to Date: 1
Plan Prescribed Dose Per Fraction: 1.8 Gy
Plan Total Fractions Prescribed: 25
Plan Total Prescribed Dose: 45 Gy
Reference Point Dosage Given to Date: 1.8 Gy
Reference Point Session Dosage Given: 1.8 Gy
Session Number: 1

## 2022-11-12 ENCOUNTER — Other Ambulatory Visit: Payer: Self-pay

## 2022-11-12 ENCOUNTER — Ambulatory Visit
Admission: RE | Admit: 2022-11-12 | Discharge: 2022-11-12 | Disposition: A | Discharge status: Home / Self Care | Payer: 59 | Source: Ambulatory Visit | Attending: Radiation Oncology | Admitting: Radiation Oncology

## 2022-11-12 DIAGNOSIS — C20 Malignant neoplasm of rectum: Secondary | ICD-10-CM | POA: Diagnosis not present

## 2022-11-12 DIAGNOSIS — Z510 Encounter for antineoplastic radiation therapy: Secondary | ICD-10-CM | POA: Diagnosis not present

## 2022-11-12 LAB — RAD ONC ARIA SESSION SUMMARY
Course Elapsed Days: 1
Plan Fractions Treated to Date: 2
Plan Prescribed Dose Per Fraction: 1.8 Gy
Plan Total Fractions Prescribed: 25
Plan Total Prescribed Dose: 45 Gy
Reference Point Dosage Given to Date: 3.6 Gy
Reference Point Session Dosage Given: 1.8 Gy
Session Number: 2

## 2022-11-13 ENCOUNTER — Ambulatory Visit
Admission: RE | Admit: 2022-11-13 | Discharge: 2022-11-13 | Disposition: A | Discharge status: Home / Self Care | Payer: 59 | Source: Ambulatory Visit | Attending: Radiation Oncology | Admitting: Radiation Oncology

## 2022-11-13 ENCOUNTER — Other Ambulatory Visit: Payer: Self-pay

## 2022-11-13 DIAGNOSIS — C20 Malignant neoplasm of rectum: Secondary | ICD-10-CM | POA: Diagnosis not present

## 2022-11-13 DIAGNOSIS — Z510 Encounter for antineoplastic radiation therapy: Secondary | ICD-10-CM | POA: Diagnosis not present

## 2022-11-13 LAB — RAD ONC ARIA SESSION SUMMARY
Course Elapsed Days: 2
Plan Fractions Treated to Date: 3
Plan Prescribed Dose Per Fraction: 1.8 Gy
Plan Total Fractions Prescribed: 25
Plan Total Prescribed Dose: 45 Gy
Reference Point Dosage Given to Date: 5.4 Gy
Reference Point Session Dosage Given: 1.8 Gy
Session Number: 3

## 2022-11-13 MED ORDER — SONAFINE EX EMUL
1.0000 | Freq: Once | CUTANEOUS | Status: AC
  Administered 2022-11-13: 1 via TOPICAL

## 2022-11-16 ENCOUNTER — Other Ambulatory Visit: Payer: Self-pay

## 2022-11-16 ENCOUNTER — Inpatient Hospital Stay: Payer: 59 | Attending: Hematology

## 2022-11-16 ENCOUNTER — Ambulatory Visit
Admission: RE | Admit: 2022-11-16 | Discharge: 2022-11-16 | Disposition: A | Discharge status: Home / Self Care | Payer: 59 | Source: Ambulatory Visit | Attending: Radiation Oncology | Admitting: Radiation Oncology

## 2022-11-16 VITALS — BP 141/82 | HR 82 | Resp 18

## 2022-11-16 DIAGNOSIS — Z95828 Presence of other vascular implants and grafts: Secondary | ICD-10-CM

## 2022-11-16 DIAGNOSIS — C20 Malignant neoplasm of rectum: Secondary | ICD-10-CM | POA: Insufficient documentation

## 2022-11-16 DIAGNOSIS — Z510 Encounter for antineoplastic radiation therapy: Secondary | ICD-10-CM | POA: Diagnosis not present

## 2022-11-16 LAB — RAD ONC ARIA SESSION SUMMARY
Course Elapsed Days: 5
Plan Fractions Treated to Date: 4
Plan Prescribed Dose Per Fraction: 1.8 Gy
Plan Total Fractions Prescribed: 25
Plan Total Prescribed Dose: 45 Gy
Reference Point Dosage Given to Date: 7.2 Gy
Reference Point Session Dosage Given: 1.8 Gy
Session Number: 4

## 2022-11-16 LAB — COMPREHENSIVE METABOLIC PANEL
ALT: 20 U/L (ref 0–44)
AST: 21 U/L (ref 15–41)
Albumin: 3.5 g/dL (ref 3.5–5.0)
Alkaline Phosphatase: 112 U/L (ref 38–126)
Anion gap: 8 (ref 5–15)
BUN: 15 mg/dL (ref 6–20)
CO2: 24 mmol/L (ref 22–32)
Calcium: 8.4 mg/dL — ABNORMAL LOW (ref 8.9–10.3)
Chloride: 105 mmol/L (ref 98–111)
Creatinine, Ser: 0.99 mg/dL (ref 0.61–1.24)
GFR, Estimated: >60 mL/min (ref >60)
Glucose, Bld: 117 mg/dL — ABNORMAL HIGH (ref 70–99)
Potassium: 3.8 mmol/L (ref 3.5–5.1)
Sodium: 137 mmol/L (ref 135–145)
Total Bilirubin: 0.8 mg/dL (ref 0.3–1.2)
Total Protein: 6.9 g/dL (ref 6.5–8.1)

## 2022-11-16 LAB — CBC WITH DIFFERENTIAL/PLATELET
Abs Immature Granulocytes: 0.01 10*3/uL (ref 0.00–0.07)
Basophils Absolute: 0 10*3/uL (ref 0.0–0.1)
Basophils Relative: 1 %
Eosinophils Absolute: 0.1 10*3/uL (ref 0.0–0.5)
Eosinophils Relative: 2 %
HCT: 37.1 % — ABNORMAL LOW (ref 39.0–52.0)
Hemoglobin: 13.2 g/dL (ref 13.0–17.0)
Immature Granulocytes: 0 %
Lymphocytes Relative: 20 %
Lymphs Abs: 0.7 10*3/uL (ref 0.7–4.0)
MCH: 33.8 pg (ref 26.0–34.0)
MCHC: 35.6 g/dL (ref 30.0–36.0)
MCV: 95.1 fL (ref 80.0–100.0)
Monocytes Absolute: 0.3 10*3/uL (ref 0.1–1.0)
Monocytes Relative: 9 %
Neutro Abs: 2.6 10*3/uL (ref 1.7–7.7)
Neutrophils Relative %: 68 %
Platelets: 146 10*3/uL — ABNORMAL LOW (ref 150–400)
RBC: 3.9 MIL/uL — ABNORMAL LOW (ref 4.22–5.81)
RDW: 11.9 % (ref 11.5–15.5)
WBC: 3.8 10*3/uL — ABNORMAL LOW (ref 4.0–10.5)
nRBC: 0 % (ref 0.0–0.2)

## 2022-11-16 LAB — MAGNESIUM: Magnesium: 1.9 mg/dL (ref 1.7–2.4)

## 2022-11-16 MED ORDER — HEPARIN SOD (PORK) LOCK FLUSH 100 UNIT/ML IV SOLN
500.0000 [IU] | Freq: Once | INTRAVENOUS | Status: AC
  Administered 2022-11-16: 500 [IU] via INTRAVENOUS

## 2022-11-16 MED ORDER — SODIUM CHLORIDE 0.9% FLUSH
10.0000 mL | Freq: Once | INTRAVENOUS | Status: AC
  Administered 2022-11-16: 10 mL via INTRAVENOUS

## 2022-11-16 NOTE — Patient Instructions (Signed)
[facility]  Discharge Instructions: Thank you for choosing [facility] to provide your oncology and hematology care.  If you have a lab appointment with the Cancer Center, please come in thru the Main Entrance and check in at the main information desk.  Wear comfortable clothing and clothing appropriate for easy access to any Portacath or PICC line.   We strive to give you quality time with your provider. You may need to reschedule your appointment if you arrive late (15 or more minutes).  Arriving late affects you and other patients whose appointments are after yours.  Also, if you miss three or more appointments without notifying the office, you may be dismissed from the clinic at the provider's discretion.      For prescription refill requests, have your pharmacy contact our office and allow 72 hours for refills to be completed.    Today you received the following Port flush, return as scheduled.   To help prevent nausea and vomiting after your treatment, we encourage you to take your nausea medication as directed.  BELOW ARE SYMPTOMS THAT SHOULD BE REPORTED IMMEDIATELY: *FEVER GREATER THAN 100.4 F (38 C) OR HIGHER *CHILLS OR SWEATING *NAUSEA AND VOMITING THAT IS NOT CONTROLLED WITH YOUR NAUSEA MEDICATION *UNUSUAL SHORTNESS OF BREATH *UNUSUAL BRUISING OR BLEEDING *URINARY PROBLEMS (pain or burning when urinating, or frequent urination) *BOWEL PROBLEMS (unusual diarrhea, constipation, pain near the anus) TENDERNESS IN MOUTH AND THROAT WITH OR WITHOUT PRESENCE OF ULCERS (sore throat, sores in mouth, or a toothache) UNUSUAL RASH, SWELLING OR PAIN  UNUSUAL VAGINAL DISCHARGE OR ITCHING   Items with * indicate a potential emergency and should be followed up as soon as possible or go to the Emergency Department if any problems should occur.  Please show the CHEMOTHERAPY ALERT CARD or IMMUNOTHERAPY ALERT CARD at check-in to the Emergency Department and  triage nurse.  Should you have questions after your visit or need to cancel or reschedule your appointment, please contact [facility] [phone]  and follow the prompts.  Office hours are 8:00 a.m. to 4:30 p.m. Monday - Friday. Please note that voicemails left after 4:00 p.m. may not be returned until the following business day.  We are closed weekends and major holidays. You have access to a nurse at all times for urgent questions. Please call the main number to the clinic [phone] and follow the prompts.  For any non-urgent questions, you may also contact your provider using MyChart. We now offer e-Visits for anyone 18 and older to request care online for non-urgent symptoms. For details visit [email or web address].   Also download the MyChart app! Go to the app store, search "MyChart", open the app, select [place], and log in with your MyChart username and password.   

## 2022-11-16 NOTE — Progress Notes (Signed)
Port flushed with good blood return noted. No bruising or swelling at site. Bandaid applied and patient discharged in satisfactory condition. VVS stable with no signs or symptoms of distressed noted. 

## 2022-11-17 ENCOUNTER — Ambulatory Visit
Admission: RE | Admit: 2022-11-17 | Discharge: 2022-11-17 | Disposition: A | Discharge status: Home / Self Care | Payer: 59 | Source: Ambulatory Visit | Attending: Radiation Oncology | Admitting: Radiation Oncology

## 2022-11-17 ENCOUNTER — Other Ambulatory Visit: Payer: Self-pay

## 2022-11-17 ENCOUNTER — Inpatient Hospital Stay (HOSPITAL_BASED_OUTPATIENT_CLINIC_OR_DEPARTMENT_OTHER): Payer: 59 | Admitting: Hematology

## 2022-11-17 ENCOUNTER — Encounter: Payer: Self-pay | Admitting: Hematology

## 2022-11-17 VITALS — BP 131/84 | HR 78 | Temp 98.0°F | Resp 18 | Wt 183.0 lb

## 2022-11-17 DIAGNOSIS — C20 Malignant neoplasm of rectum: Secondary | ICD-10-CM

## 2022-11-17 DIAGNOSIS — Z510 Encounter for antineoplastic radiation therapy: Secondary | ICD-10-CM | POA: Diagnosis not present

## 2022-11-17 LAB — RAD ONC ARIA SESSION SUMMARY
Course Elapsed Days: 6
Plan Fractions Treated to Date: 5
Plan Prescribed Dose Per Fraction: 1.8 Gy
Plan Total Fractions Prescribed: 25
Plan Total Prescribed Dose: 45 Gy
Reference Point Dosage Given to Date: 9 Gy
Reference Point Session Dosage Given: 1.8 Gy
Session Number: 5

## 2022-11-17 NOTE — Progress Notes (Signed)
[place],  [id number]    Clinic Day:  11/17/2022  Referring physician: [facility]  Patient Care Team: [facility] as PCP - General Janina Mayo, MD as PCP - Cardiology (Cardiology) Derek Jack, MD as Medical Oncologist (Medical Oncology) Brien Mates, RN as Oncology Nurse Navigator (Medical Oncology)   ASSESSMENT & PLAN:   Assessment: 1.  Stage IIIB (T3cN1) rectal adenocarcinoma, MSI stable: - Presentation with rectal bleeding - Colonoscopy (06/08/2022): Fungating partially obstructing large mass found in the rectum 11 cm from anal verge.  Partially circumferential involving one half of the lumen.  Mass measured 3 cm in length.  Diameter 2 cm.  Oozing present.  1 semipedunculated polyp (40 cm from anus) and 3 sessile polyps in the cecum removed. - Pathology (06/08/2022): Rectal mass 11 cm from anal verge-adenocarcinoma.  MMR preserved.  MSI-stable. - CEA (06/08/2022): 2.9 - MRI pelvis (06/10/2022): T3CN1, tumor extends into sigmoid from the high rectum, extension through muscularis propria, approximately 6 mm beyond the rectal wall.  Tumor begins in the highly portion of the rectum and extends into the sigmoid colon.  Extramural vascular invasion/tumor thrombus along the right lateral margin.  Shortest distance of tumor from mesorectal fascia: 15 mm.  Single high mesorectal or superior rectal lymph node measuring 9 mm.  Distance from tumor to internal anal sphincter is approximately 7-11 cm. - CT CAP (06/10/2022): Circumferential wall thickening of the upper/mid rectum, mildly enlarged high perirectal lymph nodes measuring up to 6 mm in short axis.  No evidence of distant metastatic disease in the chest, abdomen or pelvis. - Neoadjuvant FOLFOX 8 cycles from 06/29/2022 through 10/05/2022 - MRI pelvis (10/13/2022): Evidence of decreased extramural vascular invasion along the right lateral margin.  Tumor  length 3.4 cm, previously 4.5 cm.  Significant interval decrease thickening of high rectum/low sigmoid colon.  Mesorectal lymph nodes 7 mm, previously 9 mm in 4 mm node.  I discussed with radiologist who felt that there is more than 20% clinical improvement. - I have recommended foregoing radiation based on [facility] trial data.  He met with Dr. Dema Severin and Dr. Lisbeth Renshaw who have recommended chemoradiation. - Chemoradiation with Xeloda started on 11/11/2022.   2.  Social/family history: - He lives at home with his wife Lattie Haw.  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  Denies any chemical exposure.  Does not smoke cigarettes but dips tobacco. - Father had colon cancer at age 23. - Paternal grandmother died of colon cancer. - Paternal aunt had colon cancer. - Mother had "male cancer" and underwent hysterectomy.  Plan: 1.  Stage IIIb (T3CN1) rectal adenocarcinoma, MSI-stable: - He started Xeloda with radiation therapy on 11/11/2022.  He is taking Xeloda 1500 mg twice daily. - Reported severe upper thigh pain on Friday and Saturday, aching type improved with ibuprofen.  He does not have any pain today. - No mucositis or hand-foot skin reaction. - Reviewed labs: Normal LFTs and creatinine.  CBC was grossly normal with mild leukopenia and thrombocytopenia.  Continue Xeloda 1500 mg twice daily.  RTC 1 week for follow-up.   2.  Nausea/vomiting: - Continue Zofran and Phenergan as needed.   3.  Peripheral neuropathy: - Denies any numbness in the right foot.  He gets numbness in the tip of the tongue after taking Xeloda.  Will closely monitor.    Orders Placed This Encounter  Procedures   CBC with Differential    Standing  Status:   Standing    Number of Occurrences:   12    Standing Expiration Date:   11/17/2023   Comprehensive metabolic panel    Standing Status:   Standing    Number of Occurrences:   12    Standing Expiration Date:   11/17/2023   Magnesium    Standing Status:   Standing     Number of Occurrences:   12    Standing Expiration Date:   11/17/2023   CEA    Standing Status:   Standing    Number of Occurrences:   5    Standing Expiration Date:   11/17/2023     I,Alexis Herring,acting as a scribe for Derek Jack, MD.,have documented all relevant documentation on the behalf of Derek Jack, MD,as directed by  Derek Jack, MD while in the presence of Derek Jack, MD.  I, Derek Jack MD, have reviewed the above documentation for accuracy and completeness, and I agree with the above.    Derek Jack, MD   3/26/20246:01 PM  CHIEF COMPLAINT:   Diagnosis: stage III rectal cancer     Cancer Staging  Rectal carcinoma [facility]) Staging form: Colon and Rectum, AJCC 8th Edition - Clinical stage from 06/18/2022: Stage IIIB (cT3, cN1, cM0) - Unsigned    Prior Therapy: FOLFOX  Current Therapy: Xeloda, radiation  HISTORY OF PRESENT ILLNESS:   Oncology History  Rectal carcinoma ([place])  06/17/2022 Initial Diagnosis   Rectal carcinoma ([place])   06/29/2022 -  Chemotherapy   Patient is on Treatment Plan : COLORECTAL FOLFOX q14d x 4 months      Genetic Testing   [facility] Common Cancer Panel+RNA was Negative. Report date is 08/29/2022.  The Common Hereditary Cancers Panel offered by [facility] includes sequencing and/or deletion duplication testing of the following 48 genes: APC, ATM, AXIN2, BAP1, BARD1, BMPR1A, BRCA1, BRCA2, BRIP1, CDH1, CDK4, CDKN2A (p14ARF and p16INK4a only), CHEK2, CTNNA1, DICER1, EPCAM (Deletion/duplication testing only), FH, GREM1 (promoter region duplication testing only), HOXB13, KIT, MBD4, MEN1, MLH1, MSH2, MSH3, MSH6, MUTYH, NF1, NHTL1, PALB2, PDGFRA, PMS2, POLD1, POLE, PTEN, RAD51C, RAD51D, SDHA (sequencing analysis only except exon 14), SDHB, SDHC, SDHD, SMAD4, SMARCA4. STK11, TP53, TSC1, TSC2, and VHL.      INTERVAL HISTORY:   [name] is a 52 y.o. male presenting to clinic today for follow up of stage III  rectal cancer. He was last seen by me on 10/19/22.  He started Xeloda on 11/11/22. He is tolerating it well and taking it as prescribed. He reports some numbness intermittently on the tip of his tongue.  He reports x3 weeks of diarrhea and rashes on his legs after his last chemotherapy treatment. He is scheduled for radiation later today.  Today, he states that he is doing well overall. However, he started having bilateral thigh aching pain while at work on 3/22. The pain persisted into the following day but resolved after taking Ibuprofen.  His appetite level is at 100%. His energy level is at 75%. He reports being fatigued for all of last week but he felt much better yesterday. Last week he was needing to nap most days.He denies any nausea, vomiting, or mouth pain or sores.  PAST MEDICAL HISTORY:   Past Medical History: Past Medical History:  Diagnosis Date   Rectal cancer ([place]) 06/08/2022    Surgical History: Past Surgical History:  Procedure Laterality Date   COLONOSCOPY  06/08/2022   EYE SURGERY     FRACTURE SURGERY     Right  Femur- Cables in bone remain in place, rod removed in 2000   IR IMAGING GUIDED PORT INSERTION  06/22/2022   ORTHOPEDIC SURGERY      Social History: Social History   Socioeconomic History   Marital status: Married    Spouse name: Not on file   Number of children: Not on file   Years of education: Not on file   Highest education level: Not on file  Occupational History   Not on file  Tobacco Use   Smoking status: Former    Types: Cigarettes    Quit date: 2003    Years since quitting: 21.2   Smokeless tobacco: Current    Types: Snuff   Tobacco comments:    Some Dipping  Vaping Use   Vaping Use: Never used  Substance and Sexual Activity   Alcohol use: Yes    Comment: occasionally   Drug use: Never   Sexual activity: Not on file  Other Topics Concern   Not on file  Social History Narrative   Not on file   Social Determinants of Health    Financial Resource Strain: Not on file  Food Insecurity: No Food Insecurity (10/28/2022)   Hunger Vital Sign    Worried About Running Out of Food in the Last Year: Never true    Ran Out of Food in the Last Year: Never true  Transportation Needs: Not on file  Physical Activity: Not on file  Stress: Not on file  Social Connections: Not on file  Intimate Partner Violence: Not At Risk (10/28/2022)   Humiliation, Afraid, Rape, and Kick questionnaire    Fear of Current or Ex-Partner: No    Emotionally Abused: No    Physically Abused: No    Sexually Abused: No    Family History: Family History  Problem Relation Age of Onset   Cancer Mother 72 - 47       GYN malignancy details unknown, TAH/BSO   Colon cancer Father 55   Heart attack Father    Colon cancer Paternal Aunt 68   Colon cancer Paternal Grandmother 49    Current Medications:  Current Outpatient Medications:    capecitabine (XELODA) 500 MG tablet, Take 3 tablets (1,500 mg total) by mouth 2 (two) times daily after a meal. Take Monday-Friday. Take only on days of radiation., Disp: 168 tablet, Rfl: 0   dextrose 5 % SOLN 1,000 mL with fluorouracil 5 GM/100ML SOLN, Inject into the vein over 48 hr. Every 14 days, Disp: , Rfl:    escitalopram (LEXAPRO) 10 MG tablet, Take 1 tablet (10 mg total) by mouth daily., Disp: 30 tablet, Rfl: 3   megestrol (MEGACE) 400 MG/10ML suspension, Take 10 mLs (400 mg total) by mouth 2 (two) times daily., Disp: 480 mL, Rfl: 3   Multiple Vitamin (MULTIVITAMIN) tablet, Take 1 tablet by mouth daily., Disp: , Rfl:    ondansetron (ZOFRAN) 8 MG tablet, Take 1 tablet (8 mg total) by mouth every 8 (eight) hours as needed., Disp: 60 tablet, Rfl: 2   prochlorperazine (COMPAZINE) 10 MG tablet, Take 1 tablet (10 mg total) by mouth every 6 (six) hours as needed for nausea or vomiting., Disp: 30 tablet, Rfl: 1   promethazine (PHENERGAN) 25 MG suppository, Place 1 suppository (25 mg total) rectally every 6 (six) hours as  needed for nausea or vomiting., Disp: 120 each, Rfl: 2   Salicylic Acid, Acne, (SALICYLIC ACID EX), Apply 1 Application topically daily as needed (acne)., Disp: , Rfl:  sodium chloride (OCEAN) 0.65 % SOLN nasal spray, Place 1 spray into both nostrils as needed for congestion., Disp: , Rfl:    FLUOROURACIL IV, Inject into the vein every 14 (fourteen) days. (Patient not taking: Reported on 11/16/2022), Disp: , Rfl:    ibuprofen (ADVIL) 200 MG tablet, Take 200 mg by mouth every 6 (six) hours as needed for moderate pain. (Patient not taking: Reported on 11/17/2022), Disp: , Rfl:    LEUCOVORIN CALCIUM IV, Inject into the vein every 14 (fourteen) days. (Patient not taking: Reported on 11/16/2022), Disp: , Rfl:    lidocaine-prilocaine (EMLA) cream, Apply a small amount to port a cath site and cover with plastic wrap one hour prior to infusion appointments (Patient not taking: Reported on 11/16/2022), Disp: 30 g, Rfl: 3   OXALIPLATIN IV, Inject into the vein every 14 (fourteen) days. (Patient not taking: Reported on 11/16/2022), Disp: , Rfl:    Allergies: Allergies  Allergen Reactions   Hydrocodone Itching   Latex     Eye irritation     REVIEW OF SYSTEMS:   Review of Systems  Constitutional:  Positive for fatigue. Negative for appetite change, chills and fever.  HENT:   Negative for lump/mass, mouth sores, nosebleeds, sore throat and trouble swallowing.   Eyes:  Negative for eye problems.  Respiratory:  Negative for cough and shortness of breath.   Cardiovascular:  Negative for chest pain, leg swelling and palpitations.  Gastrointestinal:  Positive for diarrhea (resolved). Negative for abdominal pain, constipation, nausea and vomiting.  Genitourinary:  Negative for bladder incontinence, difficulty urinating, dysuria, frequency, hematuria and nocturia.   Musculoskeletal:  Positive for myalgias (thigh pain- resolved). Negative for arthralgias, back pain, flank pain and neck pain.  Skin:  Positive for  rash (legs). Negative for itching.  Neurological:  Negative for dizziness, headaches and numbness.  Hematological:  Does not bruise/bleed easily.  Psychiatric/Behavioral:  Negative for depression, sleep disturbance and suicidal ideas. The patient is not nervous/anxious.   All other systems reviewed and are negative.    VITALS:   Blood pressure 131/84, pulse 78, temperature 98 F (36.7 C), resp. rate 18, weight 182 lb 15.7 oz (83 kg), SpO2 100 %.  Wt Readings from Last 3 Encounters:  11/17/22 182 lb 15.7 oz (83 kg)  10/29/22 172 lb 4 oz (78.1 kg)  10/19/22 169 lb 12.8 oz (77 kg)    Body mass index is 26.26 kg/m.  Performance status (ECOG): 0 - Asymptomatic  PHYSICAL EXAM:   Physical Exam Vitals and nursing note reviewed. Exam conducted with a chaperone present.  Constitutional:      Appearance: Normal appearance.  Cardiovascular:     Rate and Rhythm: Normal rate and regular rhythm.     Pulses: Normal pulses.     Heart sounds: Normal heart sounds.  Pulmonary:     Effort: Pulmonary effort is normal.     Breath sounds: Normal breath sounds.  Abdominal:     Palpations: Abdomen is soft. There is no hepatomegaly, splenomegaly or mass.     Tenderness: There is no abdominal tenderness.  Musculoskeletal:     Right lower leg: No edema.     Left lower leg: No edema.  Lymphadenopathy:     Cervical: No cervical adenopathy.     Right cervical: No superficial, deep or posterior cervical adenopathy.    Left cervical: No superficial, deep or posterior cervical adenopathy.     Upper Body:     Right upper body: No supraclavicular or axillary  adenopathy.     Left upper body: No supraclavicular or axillary adenopathy.  Neurological:     General: No focal deficit present.     Mental Status: He is alert and oriented to person, place, and time.  Psychiatric:        Mood and Affect: Mood normal.        Behavior: Behavior normal.     LABS:      Latest Ref Rng & Units 11/16/2022     3:24 PM 10/05/2022    9:33 AM 09/21/2022    9:17 AM  CBC  WBC 4.0 - 10.5 K/uL 3.8  5.1  5.8   Hemoglobin 13.0 - 17.0 g/dL 13.2  13.4  13.8   Hematocrit 39.0 - 52.0 % 37.1  38.3  38.6   Platelets 150 - 400 K/uL 146  136  137       Latest Ref Rng & Units 11/16/2022    3:24 PM 10/05/2022    9:33 AM 09/21/2022    9:17 AM  CMP  Glucose 70 - 99 mg/dL 117  121  142   BUN 6 - 20 mg/dL 15  15  15    Creatinine 0.61 - 1.24 mg/dL 0.99  0.98  0.96   Sodium 135 - 145 mmol/L 137  137  135   Potassium 3.5 - 5.1 mmol/L 3.8  4.1  3.7   Chloride 98 - 111 mmol/L 105  109  107   CO2 22 - 32 mmol/L 24  21  21    Calcium 8.9 - 10.3 mg/dL 8.4  8.6  8.7   Total Protein 6.5 - 8.1 g/dL 6.9  7.1  7.2   Total Bilirubin 0.3 - 1.2 mg/dL 0.8  0.7  0.7   Alkaline Phos 38 - 126 U/L 112  161  179   AST 15 - 41 U/L 21  41  36   ALT 0 - 44 U/L 20  39  35      No results found for: "CEA1", "CEA" / No results found for: "CEA1", "CEA" No results found for: "PSA1" No results found for: "[id number]" No results found for: "CAN125"  No results found for: "TOTALPROTELP", "ALBUMINELP", "A1GS", "A2GS", "BETS", "BETA2SER", "GAMS", "MSPIKE", "SPEI" No results found for: "TIBC", "FERRITIN", "IRONPCTSAT" No results found for: "LDH"   STUDIES:   No results found.

## 2022-11-17 NOTE — Patient Instructions (Signed)
[place] at [facility] Discharge Instructions   You were seen and examined today by Dr. Delton Coombes.  He reviewed the results of your lab work which are normal/stable.   Continue Xeloda as prescribed.   Return as scheduled.    Thank you for choosing [place] at [facility] to provide your oncology and hematology care.  To afford each patient quality time with our provider, please arrive at least 15 minutes before your scheduled appointment time.   If you have a lab appointment with the [facility] please come in thru the Main Entrance and check in at the main information desk.  You need to re-schedule your appointment should you arrive 10 or more minutes late.  We strive to give you quality time with our providers, and arriving late affects you and other patients whose appointments are after yours.  Also, if you no show three or more times for appointments you may be dismissed from the clinic at the providers discretion.     Again, thank you for choosing [facility].  Our hope is that these requests will decrease the amount of time that you wait before being seen by our physicians.       _____________________________________________________________  Should you have questions after your visit to [facility], please contact our office at [phone] and follow the prompts.  Our office hours are 8:00 a.m. and 4:30 p.m. Monday - Friday.  Please note that voicemails left after 4:00 p.m. may not be returned until the following business day.  We are closed weekends and major holidays.  You do have access to a nurse 24-7, just call the main number to the clinic [phone] and do not press any options, hold on the line and a nurse will answer the phone.    For prescription refill requests, have your pharmacy contact our office and allow 72 hours.    Due to Covid, you will need to wear a mask upon entering the hospital.  If you do not have a mask, a mask will be given to you at the Main Entrance upon arrival. For doctor visits, patients may have 1 support person age 44 or older with them. For treatment visits, patients can not have anyone with them due to social distancing guidelines and our immunocompromised population.

## 2022-11-17 NOTE — Progress Notes (Signed)
Patient is taking Xeloda as prescribed.  He has not missed any doses and reports no side effects at this time.   

## 2022-11-18 ENCOUNTER — Ambulatory Visit
Admission: RE | Admit: 2022-11-18 | Discharge: 2022-11-18 | Disposition: A | Discharge status: Home / Self Care | Payer: 59 | Source: Ambulatory Visit | Attending: Radiation Oncology | Admitting: Radiation Oncology

## 2022-11-18 ENCOUNTER — Other Ambulatory Visit: Payer: Self-pay

## 2022-11-18 DIAGNOSIS — B372 Candidiasis of skin and nail: Secondary | ICD-10-CM | POA: Diagnosis not present

## 2022-11-18 DIAGNOSIS — R030 Elevated blood-pressure reading, without diagnosis of hypertension: Secondary | ICD-10-CM | POA: Diagnosis not present

## 2022-11-18 DIAGNOSIS — C20 Malignant neoplasm of rectum: Secondary | ICD-10-CM | POA: Diagnosis not present

## 2022-11-18 DIAGNOSIS — Z6826 Body mass index (BMI) 26.0-26.9, adult: Secondary | ICD-10-CM | POA: Diagnosis not present

## 2022-11-18 DIAGNOSIS — Z510 Encounter for antineoplastic radiation therapy: Secondary | ICD-10-CM | POA: Diagnosis not present

## 2022-11-18 LAB — RAD ONC ARIA SESSION SUMMARY
Course Elapsed Days: 7
Plan Fractions Treated to Date: 6
Plan Prescribed Dose Per Fraction: 1.8 Gy
Plan Total Fractions Prescribed: 25
Plan Total Prescribed Dose: 45 Gy
Reference Point Dosage Given to Date: 10.8 Gy
Reference Point Session Dosage Given: 1.8 Gy
Session Number: 6

## 2022-11-19 ENCOUNTER — Ambulatory Visit
Admission: RE | Admit: 2022-11-19 | Discharge: 2022-11-19 | Disposition: A | Discharge status: Home / Self Care | Payer: No Typology Code available for payment source | Source: Ambulatory Visit | Attending: Radiation Oncology | Admitting: Radiation Oncology

## 2022-11-19 ENCOUNTER — Encounter: Payer: Self-pay | Admitting: Hematology

## 2022-11-19 ENCOUNTER — Other Ambulatory Visit: Payer: Self-pay

## 2022-11-19 DIAGNOSIS — Z510 Encounter for antineoplastic radiation therapy: Secondary | ICD-10-CM | POA: Diagnosis not present

## 2022-11-19 DIAGNOSIS — C20 Malignant neoplasm of rectum: Secondary | ICD-10-CM | POA: Diagnosis not present

## 2022-11-19 LAB — RAD ONC ARIA SESSION SUMMARY
Course Elapsed Days: 8
Plan Fractions Treated to Date: 7
Plan Prescribed Dose Per Fraction: 1.8 Gy
Plan Total Fractions Prescribed: 25
Plan Total Prescribed Dose: 45 Gy
Reference Point Dosage Given to Date: 12.6 Gy
Reference Point Session Dosage Given: 1.8 Gy
Session Number: 7

## 2022-11-20 ENCOUNTER — Ambulatory Visit
Admission: RE | Admit: 2022-11-20 | Discharge: 2022-11-20 | Disposition: A | Discharge status: Home / Self Care | Payer: No Typology Code available for payment source | Source: Ambulatory Visit | Attending: Radiation Oncology

## 2022-11-20 ENCOUNTER — Other Ambulatory Visit: Payer: Self-pay

## 2022-11-20 DIAGNOSIS — C20 Malignant neoplasm of rectum: Secondary | ICD-10-CM | POA: Diagnosis not present

## 2022-11-20 DIAGNOSIS — Z510 Encounter for antineoplastic radiation therapy: Secondary | ICD-10-CM | POA: Diagnosis not present

## 2022-11-20 LAB — RAD ONC ARIA SESSION SUMMARY
Course Elapsed Days: 9
Plan Fractions Treated to Date: 8
Plan Prescribed Dose Per Fraction: 1.8 Gy
Plan Total Fractions Prescribed: 25
Plan Total Prescribed Dose: 45 Gy
Reference Point Dosage Given to Date: 14.4 Gy
Reference Point Session Dosage Given: 1.8 Gy
Session Number: 8

## 2022-11-23 ENCOUNTER — Other Ambulatory Visit: Payer: Self-pay

## 2022-11-23 ENCOUNTER — Ambulatory Visit
Admission: RE | Admit: 2022-11-23 | Discharge: 2022-11-23 | Disposition: A | Discharge status: Home / Self Care | Payer: 59 | Source: Ambulatory Visit | Attending: Radiation Oncology | Admitting: Radiation Oncology

## 2022-11-23 DIAGNOSIS — C20 Malignant neoplasm of rectum: Secondary | ICD-10-CM | POA: Diagnosis not present

## 2022-11-23 DIAGNOSIS — Z95828 Presence of other vascular implants and grafts: Secondary | ICD-10-CM | POA: Diagnosis not present

## 2022-11-23 DIAGNOSIS — Z510 Encounter for antineoplastic radiation therapy: Secondary | ICD-10-CM | POA: Diagnosis not present

## 2022-11-23 LAB — RAD ONC ARIA SESSION SUMMARY
Course Elapsed Days: 12
Plan Fractions Treated to Date: 9
Plan Prescribed Dose Per Fraction: 1.8 Gy
Plan Total Fractions Prescribed: 25
Plan Total Prescribed Dose: 45 Gy
Reference Point Dosage Given to Date: 16.2 Gy
Reference Point Session Dosage Given: 1.8 Gy
Session Number: 9

## 2022-11-23 NOTE — Progress Notes (Signed)
[place], [place] [address]    Clinic Day:  11/24/2022  Referring physician: [facility]  Patient Care Team: [facility] as PCP - General Janina Mayo, MD as PCP - Cardiology (Cardiology) Derek Jack, MD as Medical Oncologist (Medical Oncology) Brien Mates, RN as Oncology Nurse Navigator (Medical Oncology)   ASSESSMENT & PLAN:   Assessment: 1.  Stage IIIB (T3cN1) rectal adenocarcinoma, MSI stable: - Presentation with rectal bleeding - Colonoscopy (06/08/2022): Fungating partially obstructing large mass found in the rectum 11 cm from anal verge.  Partially circumferential involving one half of the lumen.  Mass measured 3 cm in length.  Diameter 2 cm.  Oozing present.  1 semipedunculated polyp (40 cm from anus) and 3 sessile polyps in the cecum removed. - Pathology (06/08/2022): Rectal mass 11 cm from anal verge-adenocarcinoma.  MMR preserved.  MSI-stable. - CEA (06/08/2022): 2.9 - MRI pelvis (06/10/2022): T3CN1, tumor extends into sigmoid from the high rectum, extension through muscularis propria, approximately 6 mm beyond the rectal wall.  Tumor begins in the highly portion of the rectum and extends into the sigmoid colon.  Extramural vascular invasion/tumor thrombus along the right lateral margin.  Shortest distance of tumor from mesorectal fascia: 15 mm.  Single high mesorectal or superior rectal lymph node measuring 9 mm.  Distance from tumor to internal anal sphincter is approximately 7-11 cm. - CT CAP (06/10/2022): Circumferential wall thickening of the upper/mid rectum, mildly enlarged high perirectal lymph nodes measuring up to 6 mm in short axis.  No evidence of distant metastatic disease in the chest, abdomen or pelvis. - Neoadjuvant FOLFOX 8 cycles from 06/29/2022 through 10/05/2022 - MRI pelvis (10/13/2022): Evidence of decreased extramural vascular invasion along the right lateral margin.  Tumor  length 3.4 cm, previously 4.5 cm.  Significant interval decrease thickening of high rectum/low sigmoid colon.  Mesorectal lymph nodes 7 mm, previously 9 mm in 4 mm node.  I discussed with radiologist who felt that there is more than 20% clinical improvement. - I have recommended foregoing radiation based on Prospect trial data.  He met with Dr. Dema Severin and Dr. Lisbeth Renshaw who have recommended chemoradiation. - Chemoradiation with Xeloda started on 11/11/2022.   2.  Social/family history: - He lives at home with his wife Lattie Haw.  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  Denies any chemical exposure.  Does not smoke cigarettes but dips tobacco. - Father had colon cancer at age 42. - Paternal grandmother died of colon cancer. - Paternal aunt had colon cancer. - Mother had "male cancer" and underwent hysterectomy.   Plan: 1.  Stage IIIb (T3CN1) rectal adenocarcinoma, MSI-stable: - Xeloda and radiation therapy started on 11/11/2022. - Denies any diarrhea.  Physical examination did not show any mucositis or hand-foot skin reaction. - Labs today: Normal LFTs, creatinine and electrolytes.  CBC shows mild leukopenia and thrombocytopenia with normal ANC. - Continue Xeloda 1500 mg twice daily Monday through Friday.  RTC 1 week for follow-up with labs and toxicity assessment.   2.  Nausea/vomiting: - Continue Zofran and Phenergan as needed.   3.  Peripheral neuropathy: - He reports the right foot is numb all the time.  Numbness in the tip of the tongue since Xeloda was started has improved.  No neuropathic pains.  No orders of the defined types were placed in this encounter.     I,Katie Daubenspeck,acting as a Education administrator for Derek Jack, MD.,have documented all relevant  documentation on the behalf of Derek Jack, MD,as directed by  Derek Jack, MD while in the presence of Derek Jack, MD.   I, Derek Jack MD, have reviewed the above documentation for accuracy and  completeness, and I agree with the above.   Derek Jack, MD   4/2/202412:34 PM  CHIEF COMPLAINT:   Diagnosis: stage III rectal cancer     Cancer Staging  Rectal carcinoma Staging form: Colon and Rectum, AJCC 8th Edition - Clinical stage from 06/18/2022: Stage IIIB (cT3, cN1, cM0) - Unsigned    Prior Therapy: FOLFOX   Current Therapy:  Xeloda, radiation    HISTORY OF PRESENT ILLNESS:   Oncology History  Rectal carcinoma  06/17/2022 Initial Diagnosis   Rectal carcinoma ([place])   06/29/2022 -  Chemotherapy   Patient is on Treatment Plan : COLORECTAL FOLFOX q14d x 4 months      Genetic Testing   [facility] Common Cancer Panel+RNA was Negative. Report date is 08/29/2022.  The Common Hereditary Cancers Panel offered by [facility] includes sequencing and/or deletion duplication testing of the following 48 genes: APC, ATM, AXIN2, BAP1, BARD1, BMPR1A, BRCA1, BRCA2, BRIP1, CDH1, CDK4, CDKN2A (p14ARF and p16INK4a only), CHEK2, CTNNA1, DICER1, EPCAM (Deletion/duplication testing only), FH, GREM1 (promoter region duplication testing only), HOXB13, KIT, MBD4, MEN1, MLH1, MSH2, MSH3, MSH6, MUTYH, NF1, NHTL1, PALB2, PDGFRA, PMS2, POLD1, POLE, PTEN, RAD51C, RAD51D, SDHA (sequencing analysis only except exon 14), SDHB, SDHC, SDHD, SMAD4, SMARCA4. STK11, TP53, TSC1, TSC2, and VHL.      INTERVAL HISTORY:   Troy Dillon is a 52 y.o. male presenting to clinic today for follow up of stage III rectal cancer . He was last seen by me on 11/17/22.  Today, he states that he is doing well overall. His appetite level is at 100%. His energy level is at 100%.  PAST MEDICAL HISTORY:   Past Medical History: Past Medical History:  Diagnosis Date   Rectal cancer 06/08/2022    Surgical History: Past Surgical History:  Procedure Laterality Date   COLONOSCOPY  06/08/2022   EYE SURGERY     FRACTURE SURGERY     Right Femur- Cables in bone remain in place, rod removed in 2000   IR IMAGING GUIDED PORT INSERTION   06/22/2022   ORTHOPEDIC SURGERY      Social History: Social History   Socioeconomic History   Marital status: Married    Spouse name: Not on file   Number of children: Not on file   Years of education: Not on file   Highest education level: Not on file  Occupational History   Not on file  Tobacco Use   Smoking status: Former    Types: Cigarettes    Quit date: 2003    Years since quitting: 21.2   Smokeless tobacco: Current    Types: Snuff   Tobacco comments:    Some Dipping  Vaping Use   Vaping Use: Never used  Substance and Sexual Activity   Alcohol use: Yes    Comment: occasionally   Drug use: Never   Sexual activity: Not on file  Other Topics Concern   Not on file  Social History Narrative   Not on file   Social Determinants of Health   Financial Resource Strain: Not on file  Food Insecurity: No Food Insecurity (10/28/2022)   Hunger Vital Sign    Worried About Running Out of Food in the Last Year: Never true    Ran Out of Food in the Last Year: Never true  Transportation Needs: Not on file  Physical Activity: Not on file  Stress: Not on file  Social Connections: Not on file  Intimate Partner Violence: Not At Risk (10/28/2022)   Humiliation, Afraid, Rape, and Kick questionnaire    Fear of Current or Ex-Partner: No    Emotionally Abused: No    Physically Abused: No    Sexually Abused: No    Family History: Family History  Problem Relation Age of Onset   Cancer Mother 69 - 29       GYN malignancy details unknown, TAH/BSO   Colon cancer Father 22   Heart attack Father    Colon cancer Paternal Aunt 68   Colon cancer Paternal Grandmother 56    Current Medications:  Current Outpatient Medications:    capecitabine (XELODA) 500 MG tablet, Take 3 tablets (1,500 mg total) by mouth 2 (two) times daily after a meal. Take Monday-Friday. Take only on days of radiation., Disp: 168 tablet, Rfl: 0   dextrose 5 % SOLN 1,000 mL with fluorouracil 5 GM/100ML SOLN, Inject  into the vein over 48 hr. Every 14 days, Disp: , Rfl:    escitalopram (LEXAPRO) 10 MG tablet, Take 1 tablet (10 mg total) by mouth daily., Disp: 30 tablet, Rfl: 3   fluconazole (DIFLUCAN) 100 MG tablet, Take 100 mg by mouth daily., Disp: , Rfl:    megestrol (MEGACE) 400 MG/10ML suspension, Take 10 mLs (400 mg total) by mouth 2 (two) times daily., Disp: 480 mL, Rfl: 3   Multiple Vitamin (MULTIVITAMIN) tablet, Take 1 tablet by mouth daily., Disp: , Rfl:    ondansetron (ZOFRAN) 8 MG tablet, Take 1 tablet (8 mg total) by mouth every 8 (eight) hours as needed., Disp: 60 tablet, Rfl: 2   prochlorperazine (COMPAZINE) 10 MG tablet, Take 1 tablet (10 mg total) by mouth every 6 (six) hours as needed for nausea or vomiting., Disp: 30 tablet, Rfl: 1   promethazine (PHENERGAN) 25 MG suppository, Place 1 suppository (25 mg total) rectally every 6 (six) hours as needed for nausea or vomiting., Disp: 120 each, Rfl: 2   Salicylic Acid, Acne, (SALICYLIC ACID EX), Apply 1 Application topically daily as needed (acne)., Disp: , Rfl:    sodium chloride (OCEAN) 0.65 % SOLN nasal spray, Place 1 spray into both nostrils as needed for congestion., Disp: , Rfl:    FLUOROURACIL IV, Inject into the vein every 14 (fourteen) days. (Patient not taking: Reported on 11/16/2022), Disp: , Rfl:    ibuprofen (ADVIL) 200 MG tablet, Take 200 mg by mouth every 6 (six) hours as needed for moderate pain. (Patient not taking: Reported on 11/17/2022), Disp: , Rfl:    LEUCOVORIN CALCIUM IV, Inject into the vein every 14 (fourteen) days. (Patient not taking: Reported on 11/16/2022), Disp: , Rfl:    lidocaine-prilocaine (EMLA) cream, Apply a small amount to port a cath site and cover with plastic wrap one hour prior to infusion appointments (Patient not taking: Reported on 11/16/2022), Disp: 30 g, Rfl: 3   OXALIPLATIN IV, Inject into the vein every 14 (fourteen) days. (Patient not taking: Reported on 11/16/2022), Disp: , Rfl:    Allergies: Allergies   Allergen Reactions   Hydrocodone Itching   Latex     Eye irritation     REVIEW OF SYSTEMS:   Review of Systems  Constitutional:  Negative for chills, fatigue and fever.  HENT:   Negative for lump/mass, mouth sores, nosebleeds, sore throat and trouble swallowing.   Eyes:  Negative for eye  problems.  Respiratory:  Negative for cough and shortness of breath.   Cardiovascular:  Negative for chest pain, leg swelling and palpitations.  Gastrointestinal:  Negative for abdominal pain, constipation, diarrhea, nausea and vomiting.  Genitourinary:  Negative for bladder incontinence, difficulty urinating, dysuria, frequency, hematuria and nocturia.   Musculoskeletal:  Negative for arthralgias, back pain, flank pain, myalgias and neck pain.  Skin:  Negative for itching and rash.  Neurological:  Positive for numbness. Negative for dizziness and headaches.  Hematological:  Does not bruise/bleed easily.  Psychiatric/Behavioral:  Negative for depression, sleep disturbance and suicidal ideas. The patient is not nervous/anxious.   All other systems reviewed and are negative.    VITALS:   There were no vitals taken for this visit.  Wt Readings from Last 3 Encounters:  11/24/22 182 lb 6.4 oz (82.7 kg)  11/17/22 182 lb 15.7 oz (83 kg)  10/29/22 172 lb 4 oz (78.1 kg)    There is no height or weight on file to calculate BMI.  Performance status (ECOG): 0 - Asymptomatic  PHYSICAL EXAM:   Physical Exam Vitals and nursing note reviewed. Exam conducted with a chaperone present.  Constitutional:      Appearance: Normal appearance.  Cardiovascular:     Rate and Rhythm: Normal rate and regular rhythm.     Pulses: Normal pulses.     Heart sounds: Normal heart sounds.  Pulmonary:     Effort: Pulmonary effort is normal.     Breath sounds: Normal breath sounds.  Abdominal:     Palpations: Abdomen is soft. There is no hepatomegaly, splenomegaly or mass.     Tenderness: There is no abdominal  tenderness.  Musculoskeletal:     Right lower leg: No edema.     Left lower leg: No edema.  Lymphadenopathy:     Cervical: No cervical adenopathy.     Right cervical: No superficial, deep or posterior cervical adenopathy.    Left cervical: No superficial, deep or posterior cervical adenopathy.     Upper Body:     Right upper body: No supraclavicular or axillary adenopathy.     Left upper body: No supraclavicular or axillary adenopathy.  Neurological:     General: No focal deficit present.     Mental Status: He is alert and oriented to person, place, and time.  Psychiatric:        Mood and Affect: Mood normal.        Behavior: Behavior normal.     LABS:      Latest Ref Rng & Units 11/24/2022   10:05 AM 11/16/2022    3:24 PM 10/05/2022    9:33 AM  CBC  WBC 4.0 - 10.5 K/uL 3.1  3.8  5.1   Hemoglobin 13.0 - 17.0 g/dL 13.8  13.2  13.4   Hematocrit 39.0 - 52.0 % 38.8  37.1  38.3   Platelets 150 - 400 K/uL 131  146  136       Latest Ref Rng & Units 11/24/2022   10:05 AM 11/16/2022    3:24 PM 10/05/2022    9:33 AM  CMP  Glucose 70 - 99 mg/dL 117  117  121   BUN 6 - 20 mg/dL 13  15  15    Creatinine 0.61 - 1.24 mg/dL 0.93  0.99  0.98   Sodium 135 - 145 mmol/L 133  137  137   Potassium 3.5 - 5.1 mmol/L 3.5  3.8  4.1   Chloride 98 - 111 mmol/L  104  105  109   CO2 22 - 32 mmol/L 23  24  21    Calcium 8.9 - 10.3 mg/dL 8.9  8.4  8.6   Total Protein 6.5 - 8.1 g/dL 7.4  6.9  7.1   Total Bilirubin 0.3 - 1.2 mg/dL 1.0  0.8  0.7   Alkaline Phos 38 - 126 U/L 101  112  161   AST 15 - 41 U/L 27  21  41   ALT 0 - 44 U/L 24  20  39      No results found for: "CEA1", "CEA" / No results found for: "CEA1", "CEA" No results found for: "PSA1" No results found for: "[id number]" No results found for: "CAN125"  No results found for: "TOTALPROTELP", "ALBUMINELP", "A1GS", "A2GS", "BETS", "BETA2SER", "GAMS", "MSPIKE", "SPEI" No results found for: "TIBC", "FERRITIN", "IRONPCTSAT" No results found for:  "LDH"   STUDIES:   No results found.

## 2022-11-24 ENCOUNTER — Encounter: Payer: Self-pay | Admitting: Hematology

## 2022-11-24 ENCOUNTER — Inpatient Hospital Stay: Payer: 59 | Admitting: Hematology

## 2022-11-24 ENCOUNTER — Other Ambulatory Visit: Payer: Self-pay

## 2022-11-24 ENCOUNTER — Inpatient Hospital Stay: Payer: 59

## 2022-11-24 ENCOUNTER — Ambulatory Visit
Admission: RE | Admit: 2022-11-24 | Discharge: 2022-11-24 | Disposition: A | Discharge status: Home / Self Care | Payer: 59 | Source: Ambulatory Visit | Attending: Radiation Oncology | Admitting: Radiation Oncology

## 2022-11-24 DIAGNOSIS — G629 Polyneuropathy, unspecified: Secondary | ICD-10-CM | POA: Insufficient documentation

## 2022-11-24 DIAGNOSIS — Z95828 Presence of other vascular implants and grafts: Secondary | ICD-10-CM | POA: Diagnosis not present

## 2022-11-24 DIAGNOSIS — C20 Malignant neoplasm of rectum: Secondary | ICD-10-CM

## 2022-11-24 DIAGNOSIS — Z510 Encounter for antineoplastic radiation therapy: Secondary | ICD-10-CM | POA: Diagnosis not present

## 2022-11-24 DIAGNOSIS — R112 Nausea with vomiting, unspecified: Secondary | ICD-10-CM | POA: Insufficient documentation

## 2022-11-24 DIAGNOSIS — Z800 Family history of malignant neoplasm of digestive organs: Secondary | ICD-10-CM | POA: Insufficient documentation

## 2022-11-24 DIAGNOSIS — Z87891 Personal history of nicotine dependence: Secondary | ICD-10-CM | POA: Insufficient documentation

## 2022-11-24 LAB — MAGNESIUM: Magnesium: 1.8 mg/dL (ref 1.7–2.4)

## 2022-11-24 LAB — CBC WITH DIFFERENTIAL/PLATELET
Abs Immature Granulocytes: 0.01 10*3/uL (ref 0.00–0.07)
Basophils Absolute: 0 10*3/uL (ref 0.0–0.1)
Basophils Relative: 1 %
Eosinophils Absolute: 0.1 10*3/uL (ref 0.0–0.5)
Eosinophils Relative: 4 %
HCT: 38.8 % — ABNORMAL LOW (ref 39.0–52.0)
Hemoglobin: 13.8 g/dL (ref 13.0–17.0)
Immature Granulocytes: 0 %
Lymphocytes Relative: 14 %
Lymphs Abs: 0.4 10*3/uL — ABNORMAL LOW (ref 0.7–4.0)
MCH: 33.4 pg (ref 26.0–34.0)
MCHC: 35.6 g/dL (ref 30.0–36.0)
MCV: 93.9 fL (ref 80.0–100.0)
Monocytes Absolute: 0.3 10*3/uL (ref 0.1–1.0)
Monocytes Relative: 9 %
Neutro Abs: 2.2 10*3/uL (ref 1.7–7.7)
Neutrophils Relative %: 72 %
Platelets: 131 10*3/uL — ABNORMAL LOW (ref 150–400)
RBC: 4.13 MIL/uL — ABNORMAL LOW (ref 4.22–5.81)
RDW: 12 % (ref 11.5–15.5)
WBC: 3.1 10*3/uL — ABNORMAL LOW (ref 4.0–10.5)
nRBC: 0 % (ref 0.0–0.2)

## 2022-11-24 LAB — COMPREHENSIVE METABOLIC PANEL
ALT: 24 U/L (ref 0–44)
AST: 27 U/L (ref 15–41)
Albumin: 3.7 g/dL (ref 3.5–5.0)
Alkaline Phosphatase: 101 U/L (ref 38–126)
Anion gap: 6 (ref 5–15)
BUN: 13 mg/dL (ref 6–20)
CO2: 23 mmol/L (ref 22–32)
Calcium: 8.9 mg/dL (ref 8.9–10.3)
Chloride: 104 mmol/L (ref 98–111)
Creatinine, Ser: 0.93 mg/dL (ref 0.61–1.24)
GFR, Estimated: >60 mL/min (ref >60)
Glucose, Bld: 117 mg/dL — ABNORMAL HIGH (ref 70–99)
Potassium: 3.5 mmol/L (ref 3.5–5.1)
Sodium: 133 mmol/L — ABNORMAL LOW (ref 135–145)
Total Bilirubin: 1 mg/dL (ref 0.3–1.2)
Total Protein: 7.4 g/dL (ref 6.5–8.1)

## 2022-11-24 LAB — RAD ONC ARIA SESSION SUMMARY
Course Elapsed Days: 13
Plan Fractions Treated to Date: 10
Plan Prescribed Dose Per Fraction: 1.8 Gy
Plan Total Fractions Prescribed: 25
Plan Total Prescribed Dose: 45 Gy
Reference Point Dosage Given to Date: 18 Gy
Reference Point Session Dosage Given: 1.8 Gy
Session Number: 10

## 2022-11-24 MED ORDER — SODIUM CHLORIDE 0.9% FLUSH
10.0000 mL | Freq: Once | INTRAVENOUS | Status: AC
  Administered 2022-11-24: 10 mL via INTRAVENOUS

## 2022-11-24 MED ORDER — HEPARIN SOD (PORK) LOCK FLUSH 100 UNIT/ML IV SOLN
500.0000 [IU] | Freq: Once | INTRAVENOUS | Status: AC
  Administered 2022-11-24: 500 [IU] via INTRAVENOUS

## 2022-11-24 NOTE — Progress Notes (Signed)
Patient is taking Troy Dillon as prescribed.  He has not missed any doses and reports no side effects at this time.

## 2022-11-24 NOTE — Patient Instructions (Signed)
[place] at [facility] Discharge Instructions   You were seen and examined today by Dr. Delton Coombes.  He reviewed the results of your lab work which are normal/stable.   Continue Xeloda as prescribed.   Return as scheduled in 1 week.    Thank you for choosing [place] at [facility] to provide your oncology and hematology care.  To afford each patient quality time with our provider, please arrive at least 15 minutes before your scheduled appointment time.   If you have a lab appointment with the [facility] please come in thru the Main Entrance and check in at the main information desk.  You need to re-schedule your appointment should you arrive 10 or more minutes late.  We strive to give you quality time with our providers, and arriving late affects you and other patients whose appointments are after yours.  Also, if you no show three or more times for appointments you may be dismissed from the clinic at the providers discretion.     Again, thank you for choosing [facility].  Our hope is that these requests will decrease the amount of time that you wait before being seen by our physicians.       _____________________________________________________________  Should you have questions after your visit to [facility], please contact our office at [phone] and follow the prompts.  Our office hours are 8:00 a.m. and 4:30 p.m. Monday - Friday.  Please note that voicemails left after 4:00 p.m. may not be returned until the following business day.  We are closed weekends and major holidays.  You do have access to a nurse 24-7, just call the main number to the clinic [phone] and do not press any options, hold on the line and a nurse will answer the phone.    For prescription refill requests, have your pharmacy contact our office and allow 72 hours.    Due to Covid, you will need to wear a mask upon entering the  hospital. If you do not have a mask, a mask will be given to you at the Main Entrance upon arrival. For doctor visits, patients may have 1 support person age 40 or older with them. For treatment visits, patients can not have anyone with them due to social distancing guidelines and our immunocompromised population.

## 2022-11-25 ENCOUNTER — Other Ambulatory Visit: Payer: Self-pay

## 2022-11-25 ENCOUNTER — Ambulatory Visit
Admission: RE | Admit: 2022-11-25 | Discharge: 2022-11-25 | Disposition: A | Discharge status: Home / Self Care | Payer: 59 | Source: Ambulatory Visit | Attending: Radiation Oncology | Admitting: Radiation Oncology

## 2022-11-25 DIAGNOSIS — C20 Malignant neoplasm of rectum: Secondary | ICD-10-CM | POA: Diagnosis not present

## 2022-11-25 DIAGNOSIS — Z95828 Presence of other vascular implants and grafts: Secondary | ICD-10-CM | POA: Diagnosis not present

## 2022-11-25 DIAGNOSIS — Z510 Encounter for antineoplastic radiation therapy: Secondary | ICD-10-CM | POA: Diagnosis not present

## 2022-11-25 LAB — RAD ONC ARIA SESSION SUMMARY
Course Elapsed Days: 14
Plan Fractions Treated to Date: 11
Plan Prescribed Dose Per Fraction: 1.8 Gy
Plan Total Fractions Prescribed: 25
Plan Total Prescribed Dose: 45 Gy
Reference Point Dosage Given to Date: 19.8 Gy
Reference Point Session Dosage Given: 1.8 Gy
Session Number: 11

## 2022-11-26 ENCOUNTER — Encounter: Payer: Self-pay | Admitting: Hematology

## 2022-11-26 ENCOUNTER — Ambulatory Visit
Admission: RE | Admit: 2022-11-26 | Discharge: 2022-11-26 | Disposition: A | Discharge status: Home / Self Care | Payer: 59 | Source: Ambulatory Visit | Attending: Radiation Oncology | Admitting: Radiation Oncology

## 2022-11-26 ENCOUNTER — Other Ambulatory Visit: Payer: Self-pay

## 2022-11-26 DIAGNOSIS — C20 Malignant neoplasm of rectum: Secondary | ICD-10-CM | POA: Diagnosis not present

## 2022-11-26 DIAGNOSIS — Z510 Encounter for antineoplastic radiation therapy: Secondary | ICD-10-CM | POA: Diagnosis not present

## 2022-11-26 DIAGNOSIS — Z95828 Presence of other vascular implants and grafts: Secondary | ICD-10-CM | POA: Diagnosis not present

## 2022-11-26 LAB — RAD ONC ARIA SESSION SUMMARY
Course Elapsed Days: 15
Plan Fractions Treated to Date: 12
Plan Prescribed Dose Per Fraction: 1.8 Gy
Plan Total Fractions Prescribed: 25
Plan Total Prescribed Dose: 45 Gy
Reference Point Dosage Given to Date: 21.6 Gy
Reference Point Session Dosage Given: 1.8 Gy
Session Number: 12

## 2022-11-27 ENCOUNTER — Other Ambulatory Visit: Payer: Self-pay

## 2022-11-27 ENCOUNTER — Ambulatory Visit
Admission: RE | Admit: 2022-11-27 | Discharge: 2022-11-27 | Disposition: A | Discharge status: Home / Self Care | Payer: 59 | Source: Ambulatory Visit | Attending: Radiation Oncology | Admitting: Radiation Oncology

## 2022-11-27 DIAGNOSIS — Z510 Encounter for antineoplastic radiation therapy: Secondary | ICD-10-CM | POA: Diagnosis not present

## 2022-11-27 DIAGNOSIS — Z95828 Presence of other vascular implants and grafts: Secondary | ICD-10-CM | POA: Diagnosis not present

## 2022-11-27 DIAGNOSIS — C20 Malignant neoplasm of rectum: Secondary | ICD-10-CM | POA: Diagnosis not present

## 2022-11-27 LAB — RAD ONC ARIA SESSION SUMMARY
Course Elapsed Days: 16
Plan Fractions Treated to Date: 13
Plan Prescribed Dose Per Fraction: 1.8 Gy
Plan Total Fractions Prescribed: 25
Plan Total Prescribed Dose: 45 Gy
Reference Point Dosage Given to Date: 23.4 Gy
Reference Point Session Dosage Given: 1.8 Gy
Session Number: 13

## 2022-11-30 ENCOUNTER — Other Ambulatory Visit: Payer: Self-pay

## 2022-11-30 ENCOUNTER — Ambulatory Visit
Admission: RE | Admit: 2022-11-30 | Discharge: 2022-11-30 | Disposition: A | Discharge status: Home / Self Care | Payer: 59 | Source: Ambulatory Visit | Attending: Radiation Oncology | Admitting: Radiation Oncology

## 2022-11-30 DIAGNOSIS — Z510 Encounter for antineoplastic radiation therapy: Secondary | ICD-10-CM | POA: Diagnosis not present

## 2022-11-30 DIAGNOSIS — Z95828 Presence of other vascular implants and grafts: Secondary | ICD-10-CM | POA: Diagnosis not present

## 2022-11-30 DIAGNOSIS — C20 Malignant neoplasm of rectum: Secondary | ICD-10-CM | POA: Diagnosis not present

## 2022-11-30 LAB — RAD ONC ARIA SESSION SUMMARY
Course Elapsed Days: 19
Plan Fractions Treated to Date: 14
Plan Prescribed Dose Per Fraction: 1.8 Gy
Plan Total Fractions Prescribed: 25
Plan Total Prescribed Dose: 45 Gy
Reference Point Dosage Given to Date: 25.2 Gy
Reference Point Session Dosage Given: 1.8 Gy
Session Number: 14

## 2022-11-30 NOTE — Progress Notes (Signed)
[facility] [address], [place] [address]    Clinic Day:  12/01/2022  Referring physician: [facility]  Patient Care Team: [facility] as PCP - General Troy Fus, MD as PCP - Cardiology (Cardiology) Doreatha Massed, MD as Medical Oncologist (Medical Oncology) Therese Sarah, RN as Oncology Nurse Navigator (Medical Oncology)   ASSESSMENT & PLAN:   Assessment: 1.  Stage IIIB (T3cN1) rectal adenocarcinoma, MSI stable: - Presentation with rectal bleeding - Colonoscopy (06/08/2022): Fungating partially obstructing large mass found in the rectum 11 cm from anal verge.  Partially circumferential involving one half of the lumen.  Mass measured 3 cm in length.  Diameter 2 cm.  Oozing present.  1 semipedunculated polyp (40 cm from anus) and 3 sessile polyps in the cecum removed. - Pathology (06/08/2022): Rectal mass 11 cm from anal verge-adenocarcinoma.  MMR preserved.  MSI-stable. - CEA (06/08/2022): 2.9 - MRI pelvis (06/10/2022): T3CN1, tumor extends into sigmoid from the high rectum, extension through muscularis propria, approximately 6 mm beyond the rectal wall.  Tumor begins in the highly portion of the rectum and extends into the sigmoid colon.  Extramural vascular invasion/tumor thrombus along the right lateral margin.  Shortest distance of tumor from mesorectal fascia: 15 mm.  Single high mesorectal or superior rectal lymph node measuring 9 mm.  Distance from tumor to internal anal sphincter is approximately 7-11 cm. - CT CAP (06/10/2022): Circumferential wall thickening of the upper/mid rectum, mildly enlarged high perirectal lymph nodes measuring up to 6 mm in short axis.  No evidence of distant metastatic disease in the chest, abdomen or pelvis. - Neoadjuvant FOLFOX 8 cycles from 06/29/2022 through 10/05/2022 - MRI pelvis (10/13/2022): Evidence of decreased extramural vascular invasion along the right lateral margin.  Tumor  length 3.4 cm, previously 4.5 cm.  Significant interval decrease thickening of high rectum/low sigmoid colon.  Mesorectal lymph nodes 7 mm, previously 9 mm in 4 mm node.  I discussed with radiologist who felt that there is more than 20% clinical improvement. - I have recommended foregoing radiation based on Prospect trial data.  He met with Dr. Cliffton Asters and Dr. Mitzi Hansen who have recommended chemoradiation. - Chemoradiation with Xeloda started on 11/11/2022.   2.  Social/family history: - He lives at home with his wife Troy Dillon.  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  Denies any chemical exposure.  Does not smoke cigarettes but dips tobacco. - Father had colon cancer at age 63. - Paternal grandmother died of colon cancer. - Paternal aunt had colon cancer. - Mother had "male cancer" and underwent hysterectomy.   Plan: 1.  Stage IIIb (T3CN1) rectal adenocarcinoma, MSI-stable: - Xeloda and XRT started on 11/11/2022. - Denies any GI side effects.  No mucositis.  No hand-foot skin reaction.  He has missed last night's dose of Xeloda. - Reviewed labs today: Normal LFTs, creatinine and electrolytes.  CBC shows leukopenia with white count 2.8 and ANC of 1.7.  Mild thrombocytopenia with plt 144.  Hemoglobin normal. - No dose adjustments needed.  Continue Xeloda 3 tablets twice daily Monday through Friday.  RTC 1 week for follow-up.   2.  Nausea/vomiting: - Continue Zofran and Phenergan as needed.   3.  Peripheral neuropathy: - He has constant numbness in the fingertips and right foot which has been stable.  No neuropathic pains.  Will closely monitor.  No orders of the defined types were placed in this encounter.  I,Katie Daubenspeck,acting as a Neurosurgeon for Doreatha Massed, MD.,have documented all relevant documentation on the behalf of Doreatha Massed, MD,as directed by  Doreatha Massed, MD while in the presence of Doreatha Massed, MD.   I, Doreatha Massed MD, have reviewed  the above documentation for accuracy and completeness, and I agree with the above.   Doreatha Massed, MD   4/9/20244:54 PM  CHIEF COMPLAINT:   Diagnosis: stage III rectal cancer     Cancer Staging  Rectal carcinoma Staging form: Colon and Rectum, AJCC 8th Edition - Clinical stage from 06/18/2022: Stage IIIB (cT3, cN1, cM0) - Unsigned    Prior Therapy: FOLFOX   Current Therapy:  Xeloda, radiation    HISTORY OF PRESENT ILLNESS:   Oncology History  Rectal carcinoma  06/17/2022 Initial Diagnosis   Rectal carcinoma (HCC)   06/29/2022 -  Chemotherapy   Patient is on Treatment Plan : COLORECTAL FOLFOX q14d x 4 months      Genetic Testing   [facility] Common Cancer Panel+RNA was Negative. Report date is 08/29/2022.  The Common Hereditary Cancers Panel offered by [facility] includes sequencing and/or deletion duplication testing of the following 48 genes: APC, ATM, AXIN2, BAP1, BARD1, BMPR1A, BRCA1, BRCA2, BRIP1, CDH1, CDK4, CDKN2A (p14ARF and p16INK4a only), CHEK2, CTNNA1, DICER1, EPCAM (Deletion/duplication testing only), FH, GREM1 (promoter region duplication testing only), HOXB13, KIT, MBD4, MEN1, MLH1, MSH2, MSH3, MSH6, MUTYH, NF1, NHTL1, PALB2, PDGFRA, PMS2, POLD1, POLE, PTEN, RAD51C, RAD51D, SDHA (sequencing analysis only except exon 14), SDHB, SDHC, SDHD, SMAD4, SMARCA4. STK11, TP53, TSC1, TSC2, and VHL.      INTERVAL HISTORY:   Troy Dillon is a 52 y.o. male presenting to clinic today for follow up of stage III rectal cancer. He was last seen by me on 11/24/22.  Today, he states that he is doing well overall. His appetite level is at 100%. His energy level is at 100%.  PAST MEDICAL HISTORY:   Past Medical History: Past Medical History:  Diagnosis Date   Rectal cancer 06/08/2022    Surgical History: Past Surgical History:  Procedure Laterality Date   COLONOSCOPY  06/08/2022   EYE SURGERY     FRACTURE SURGERY     Right Femur- Cables in bone remain in place, rod removed in  2000   IR IMAGING GUIDED PORT INSERTION  06/22/2022   ORTHOPEDIC SURGERY      Social History: Social History   Socioeconomic History   Marital status: Married    Spouse name: Not on file   Number of children: Not on file   Years of education: Not on file   Highest education level: Not on file  Occupational History   Not on file  Tobacco Use   Smoking status: Former    Types: Cigarettes    Quit date: 2003    Years since quitting: 21.2   Smokeless tobacco: Current    Types: Snuff   Tobacco comments:    Some Dipping  Vaping Use   Vaping Use: Never used  Substance and Sexual Activity   Alcohol use: Yes    Comment: occasionally   Drug use: Never   Sexual activity: Not on file  Other Topics Concern   Not on file  Social History Narrative   Not on file   Social Determinants of Health   Financial Resource Strain: Not on file  Food Insecurity: No Food Insecurity (10/28/2022)   Hunger Vital Sign    Worried About Running Out of Food in the Last Year: Never true  Ran Out of Food in the Last Year: Never true  Transportation Needs: Not on file  Physical Activity: Not on file  Stress: Not on file  Social Connections: Not on file  Intimate Partner Violence: Not At Risk (10/28/2022)   Humiliation, Afraid, Rape, and Kick questionnaire    Fear of Current or Ex-Partner: No    Emotionally Abused: No    Physically Abused: No    Sexually Abused: No    Family History: Family History  Problem Relation Age of Onset   Cancer Mother [phone] [phone]       GYN malignancy details unknown, TAH/BSO   Colon cancer Father [id number]   Heart attack Father    Colon cancer Paternal Aunt [phone]   Colon cancer Paternal Grandmother [phone]    Current Medications:  Current Outpatient Medications:    capecitabine (XELODA) 500 MG tablet, Take 3 tablets (1,500 mg total) by mouth 2 (two) times daily after a meal. Take Monday-Friday. Take only on days of radiation., Disp: 168 tablet, Rfl: 0   dextrose 5 % SOLN 1,000 mL  with fluorouracil 5 GM/100ML SOLN, Inject into the vein over 48 hr. Every 14 days, Disp: , Rfl:    escitalopram (LEXAPRO) 10 MG tablet, Take 1 tablet (10 mg total) by mouth daily., Disp: 30 tablet, Rfl: 3   fluconazole (DIFLUCAN) 100 MG tablet, Take 100 mg by mouth daily., Disp: , Rfl:    megestrol (MEGACE) 400 MG/10ML suspension, Take 10 mLs (400 mg total) by mouth 2 (two) times daily., Disp: 480 mL, Rfl: 3   Multiple Vitamin (MULTIVITAMIN) tablet, Take 1 tablet by mouth daily., Disp: , Rfl:    ondansetron (ZOFRAN) 8 MG tablet, Take 1 tablet (8 mg total) by mouth every 8 (eight) hours as needed., Disp: 60 tablet, Rfl: 2   prochlorperazine (COMPAZINE) 10 MG tablet, Take 1 tablet (10 mg total) by mouth every 6 (six) hours as needed for nausea or vomiting., Disp: 30 tablet, Rfl: 1   promethazine (PHENERGAN) 25 MG suppository, Place 1 suppository (25 mg total) rectally every 6 (six) hours as needed for nausea or vomiting., Disp: 120 each, Rfl: 2   Salicylic Acid, Acne, (SALICYLIC ACID EX), Apply 1 Application topically daily as needed (acne)., Disp: , Rfl:    sodium chloride (OCEAN) 0.65 % SOLN nasal spray, Place 1 spray into both nostrils as needed for congestion., Disp: , Rfl:    FLUOROURACIL IV, Inject into the vein every 14 (fourteen) days. (Patient not taking: Reported on 11/16/2022), Disp: , Rfl:    ibuprofen (ADVIL) 200 MG tablet, Take 200 mg by mouth every 6 (six) hours as needed for moderate pain. (Patient not taking: Reported on 11/17/2022), Disp: , Rfl:    LEUCOVORIN CALCIUM IV, Inject into the vein every 14 (fourteen) days. (Patient not taking: Reported on 11/16/2022), Disp: , Rfl:    lidocaine-prilocaine (EMLA) cream, Apply a small amount to port a cath site and cover with plastic wrap one hour prior to infusion appointments (Patient not taking: Reported on 11/16/2022), Disp: 30 g, Rfl: 3   OXALIPLATIN IV, Inject into the vein every 14 (fourteen) days. (Patient not taking: Reported on 11/16/2022),  Disp: , Rfl:    Allergies: Allergies  Allergen Reactions   Hydrocodone Itching   Latex     Eye irritation     REVIEW OF SYSTEMS:   Review of Systems  Constitutional:  Negative for chills, fatigue and fever.  HENT:   Negative for lump/mass, mouth sores, nosebleeds, sore  throat and trouble swallowing.   Eyes:  Negative for eye problems.  Respiratory:  Negative for cough and shortness of breath.   Cardiovascular:  Negative for chest pain, leg swelling and palpitations.  Gastrointestinal:  Negative for abdominal pain, constipation, diarrhea, nausea and vomiting.  Genitourinary:  Negative for bladder incontinence, difficulty urinating, dysuria, frequency, hematuria and nocturia.   Musculoskeletal:  Negative for arthralgias, back pain, flank pain, myalgias and neck pain.  Skin:  Negative for itching and rash.  Neurological:  Positive for numbness. Negative for dizziness and headaches.  Hematological:  Does not bruise/bleed easily.  Psychiatric/Behavioral:  Negative for depression, sleep disturbance and suicidal ideas. The patient is not nervous/anxious.   All other systems reviewed and are negative.    VITALS:   There were no vitals taken for this visit.  Wt Readings from Last 3 Encounters:  12/01/22 181 lb 4.8 oz (82.2 kg)  11/24/22 182 lb 6.4 oz (82.7 kg)  11/17/22 182 lb 15.7 oz (83 kg)    There is no height or weight on file to calculate BMI.  Performance status (ECOG): 0 - Asymptomatic  PHYSICAL EXAM:   Physical Exam Vitals and nursing note reviewed. Exam conducted with a chaperone present.  Constitutional:      Appearance: Normal appearance.  Cardiovascular:     Rate and Rhythm: Normal rate and regular rhythm.     Pulses: Normal pulses.     Heart sounds: Normal heart sounds.  Pulmonary:     Effort: Pulmonary effort is normal.     Breath sounds: Normal breath sounds.  Abdominal:     Palpations: Abdomen is soft. There is no hepatomegaly, splenomegaly or mass.      Tenderness: There is no abdominal tenderness.  Musculoskeletal:     Right lower leg: No edema.     Left lower leg: No edema.  Lymphadenopathy:     Cervical: No cervical adenopathy.     Right cervical: No superficial, deep or posterior cervical adenopathy.    Left cervical: No superficial, deep or posterior cervical adenopathy.     Upper Body:     Right upper body: No supraclavicular or axillary adenopathy.     Left upper body: No supraclavicular or axillary adenopathy.  Neurological:     General: No focal deficit present.     Mental Status: He is alert and oriented to person, place, and time.  Psychiatric:        Mood and Affect: Mood normal.        Behavior: Behavior normal.     LABS:      Latest Ref Rng & Units 12/01/2022   10:37 AM 11/24/2022   10:05 AM 11/16/2022    3:24 PM  CBC  WBC 4.0 - 10.5 K/uL 2.8  3.1  3.8   Hemoglobin 13.0 - 17.0 g/dL 29.5  62.1  30.8   Hematocrit 39.0 - 52.0 % 38.4  38.8  37.1   Platelets 150 - 400 K/uL 144  131  146       Latest Ref Rng & Units 12/01/2022   10:37 AM 11/24/2022   10:05 AM 11/16/2022    3:24 PM  CMP  Glucose 70 - 99 mg/dL 657  846  962   BUN 6 - 20 mg/dL Creatinine 0.61 - 1.24 mg/dL 9.52  8.41  3.24   Sodium 135 - 145 mmol/L 133  133  137   Potassium 3.5 - 5.1 mmol/L 3.9  3.5  3.8   Chloride 98 - 111 mmol/L 105  104  105   CO2 22 - 32 mmol/L 23  23  24    Calcium 8.9 - 10.3 mg/dL 8.7  8.9  8.4   Total Protein 6.5 - 8.1 g/dL 7.2  7.4  6.9   Total Bilirubin 0.3 - 1.2 mg/dL 0.9  1.0  0.8   Alkaline Phos 38 - 126 U/L 107  101  112   AST 15 - 41 U/L 25  27  21    ALT 0 - 44 U/L 20  24  20       No results found for: "CEA1", "CEA" / No results found for: "CEA1", "CEA" No results found for: "PSA1" No results found for: "ZOX096" No results found for: "CAN125"  No results found for: "TOTALPROTELP", "ALBUMINELP", "A1GS", "A2GS", "BETS", "BETA2SER", "GAMS", "MSPIKE", "SPEI" No results found for: "TIBC", "FERRITIN",  "IRONPCTSAT" No results found for: "LDH"   STUDIES:   No results found.

## 2022-12-01 ENCOUNTER — Inpatient Hospital Stay (HOSPITAL_BASED_OUTPATIENT_CLINIC_OR_DEPARTMENT_OTHER): Payer: 59 | Admitting: Hematology

## 2022-12-01 ENCOUNTER — Inpatient Hospital Stay: Payer: 59

## 2022-12-01 ENCOUNTER — Encounter: Payer: Self-pay | Admitting: Hematology

## 2022-12-01 ENCOUNTER — Other Ambulatory Visit: Payer: Self-pay

## 2022-12-01 ENCOUNTER — Ambulatory Visit
Admission: RE | Admit: 2022-12-01 | Discharge: 2022-12-01 | Disposition: A | Discharge status: Home / Self Care | Payer: 59 | Source: Ambulatory Visit | Attending: Radiation Oncology | Admitting: Radiation Oncology

## 2022-12-01 VITALS — BP 138/86 | Temp 96.7°F | Resp 20 | Ht 70.0 in | Wt 181.3 lb

## 2022-12-01 DIAGNOSIS — Z95828 Presence of other vascular implants and grafts: Secondary | ICD-10-CM | POA: Diagnosis not present

## 2022-12-01 DIAGNOSIS — C20 Malignant neoplasm of rectum: Secondary | ICD-10-CM

## 2022-12-01 DIAGNOSIS — Z510 Encounter for antineoplastic radiation therapy: Secondary | ICD-10-CM | POA: Diagnosis not present

## 2022-12-01 LAB — COMPREHENSIVE METABOLIC PANEL
ALT: 20 U/L (ref 0–44)
AST: 25 U/L (ref 15–41)
Albumin: 3.8 g/dL (ref 3.5–5.0)
Alkaline Phosphatase: 107 U/L (ref 38–126)
Anion gap: 5 (ref 5–15)
BUN: 13 mg/dL (ref 6–20)
CO2: 23 mmol/L (ref 22–32)
Calcium: 8.7 mg/dL — ABNORMAL LOW (ref 8.9–10.3)
Chloride: 105 mmol/L (ref 98–111)
Creatinine, Ser: 0.84 mg/dL (ref 0.61–1.24)
GFR, Estimated: >60 mL/min (ref >60)
Glucose, Bld: 122 mg/dL — ABNORMAL HIGH (ref 70–99)
Potassium: 3.9 mmol/L (ref 3.5–5.1)
Sodium: 133 mmol/L — ABNORMAL LOW (ref 135–145)
Total Bilirubin: 0.9 mg/dL (ref 0.3–1.2)
Total Protein: 7.2 g/dL (ref 6.5–8.1)

## 2022-12-01 LAB — MAGNESIUM: Magnesium: 2 mg/dL (ref 1.7–2.4)

## 2022-12-01 LAB — RAD ONC ARIA SESSION SUMMARY
Course Elapsed Days: 20
Plan Fractions Treated to Date: 15
Plan Prescribed Dose Per Fraction: 1.8 Gy
Plan Total Fractions Prescribed: 25
Plan Total Prescribed Dose: 45 Gy
Reference Point Dosage Given to Date: 27 Gy
Reference Point Session Dosage Given: 1.8 Gy
Session Number: 15

## 2022-12-01 LAB — CBC WITH DIFFERENTIAL/PLATELET
Abs Immature Granulocytes: 0 10*3/uL (ref 0.00–0.07)
Basophils Absolute: 0 10*3/uL (ref 0.0–0.1)
Basophils Relative: 1 %
Eosinophils Absolute: 0.2 10*3/uL (ref 0.0–0.5)
Eosinophils Relative: 8 %
HCT: 38.4 % — ABNORMAL LOW (ref 39.0–52.0)
Hemoglobin: 13.8 g/dL (ref 13.0–17.0)
Immature Granulocytes: 0 %
Lymphocytes Relative: 17 %
Lymphs Abs: 0.5 10*3/uL — ABNORMAL LOW (ref 0.7–4.0)
MCH: 33.8 pg (ref 26.0–34.0)
MCHC: 35.9 g/dL (ref 30.0–36.0)
MCV: 94.1 fL (ref 80.0–100.0)
Monocytes Absolute: 0.3 10*3/uL (ref 0.1–1.0)
Monocytes Relative: 12 %
Neutro Abs: 1.7 10*3/uL (ref 1.7–7.7)
Neutrophils Relative %: 62 %
Platelets: 144 10*3/uL — ABNORMAL LOW (ref 150–400)
RBC: 4.08 MIL/uL — ABNORMAL LOW (ref 4.22–5.81)
RDW: 13.4 % (ref 11.5–15.5)
WBC: 2.8 10*3/uL — ABNORMAL LOW (ref 4.0–10.5)
nRBC: 0 % (ref 0.0–0.2)

## 2022-12-01 MED ORDER — SODIUM CHLORIDE 0.9% FLUSH
10.0000 mL | INTRAVENOUS | Status: DC | PRN
  Administered 2022-12-01: 10 mL via INTRAVENOUS

## 2022-12-01 MED ORDER — HEPARIN SOD (PORK) LOCK FLUSH 100 UNIT/ML IV SOLN
500.0000 [IU] | Freq: Once | INTRAVENOUS | Status: AC
  Administered 2022-12-01: 500 [IU] via INTRAVENOUS

## 2022-12-01 NOTE — Progress Notes (Signed)
Patients port flushed without difficulty.  Good blood return noted with no bruising or swelling noted at site.  Band aid applied.  VSS with discharge and left in satisfactory condition with no s/s of distress noted.   

## 2022-12-01 NOTE — Progress Notes (Signed)
Patient is taking Xeloda as prescribed.  He has not missed any doses and reports no side effects at this time.   

## 2022-12-01 NOTE — Patient Instructions (Addendum)
[facility] Discharge Instructions   You were seen and examined today by Dr. Ellin Saba.  He reviewed the results of your lab work. Your total white blood cell count is low at 2.8. But the neutrophils (the white blood cells that fight against infection) are normal. All other labs are normal.   Continue Xeloda as prescribed.   Return as scheduled in one week.    Thank you for choosing  [facility] to provide your oncology and hematology care.  To afford each patient quality time with our provider, please arrive at least 15 minutes before your scheduled appointment time.   If you have a lab appointment with the Cancer Center please come in thru the Main Entrance and check in at the main information desk.  You need to re-schedule your appointment should you arrive 10 or more minutes late.  We strive to give you quality time with our providers, and arriving late affects you and other patients whose appointments are after yours.  Also, if you no show three or more times for appointments you may be dismissed from the clinic at the providers discretion.     Again, thank you for choosing [facility].  Our hope is that these requests will decrease the amount of time that you wait before being seen by our physicians.       _____________________________________________________________  Should you have questions after your visit to [facility], please contact our office at [phone] and follow the prompts.  Our office hours are 8:00 a.m. and 4:30 p.m. Monday - Friday.  Please note that voicemails left after 4:00 p.m. may not be returned until the following business day.  We are closed weekends and major holidays.  You do have access to a nurse 24-7, just call the main number to the clinic [phone] and do not press any options, hold on the line and a nurse will answer the phone.    For prescription  refill requests, have your pharmacy contact our office and allow 72 hours.    Due to Covid, you will need to wear a mask upon entering the hospital. If you do not have a mask, a mask will be given to you at the Main Entrance upon arrival. For doctor visits, patients may have 1 support person age 42 or older with them. For treatment visits, patients can not have anyone with them due to social distancing guidelines and our immunocompromised population.

## 2022-12-02 ENCOUNTER — Other Ambulatory Visit: Payer: Self-pay

## 2022-12-02 ENCOUNTER — Ambulatory Visit
Admission: RE | Admit: 2022-12-02 | Discharge: 2022-12-02 | Disposition: A | Discharge status: Home / Self Care | Payer: 59 | Source: Ambulatory Visit | Attending: Radiation Oncology | Admitting: Radiation Oncology

## 2022-12-02 DIAGNOSIS — C20 Malignant neoplasm of rectum: Secondary | ICD-10-CM | POA: Diagnosis not present

## 2022-12-02 DIAGNOSIS — Z510 Encounter for antineoplastic radiation therapy: Secondary | ICD-10-CM | POA: Diagnosis not present

## 2022-12-02 DIAGNOSIS — Z95828 Presence of other vascular implants and grafts: Secondary | ICD-10-CM | POA: Diagnosis not present

## 2022-12-02 LAB — RAD ONC ARIA SESSION SUMMARY
Course Elapsed Days: 21
Plan Fractions Treated to Date: 16
Plan Prescribed Dose Per Fraction: 1.8 Gy
Plan Total Fractions Prescribed: 25
Plan Total Prescribed Dose: 45 Gy
Reference Point Dosage Given to Date: 28.8 Gy
Reference Point Session Dosage Given: 1.8 Gy
Session Number: 16

## 2022-12-03 ENCOUNTER — Other Ambulatory Visit: Payer: Self-pay

## 2022-12-03 ENCOUNTER — Ambulatory Visit
Admission: RE | Admit: 2022-12-03 | Discharge: 2022-12-03 | Disposition: A | Discharge status: Home / Self Care | Payer: 59 | Source: Ambulatory Visit | Attending: Radiation Oncology | Admitting: Radiation Oncology

## 2022-12-03 DIAGNOSIS — Z510 Encounter for antineoplastic radiation therapy: Secondary | ICD-10-CM | POA: Diagnosis not present

## 2022-12-03 DIAGNOSIS — Z95828 Presence of other vascular implants and grafts: Secondary | ICD-10-CM | POA: Diagnosis not present

## 2022-12-03 DIAGNOSIS — C20 Malignant neoplasm of rectum: Secondary | ICD-10-CM | POA: Diagnosis not present

## 2022-12-03 LAB — RAD ONC ARIA SESSION SUMMARY
Course Elapsed Days: 22
Plan Fractions Treated to Date: 17
Plan Prescribed Dose Per Fraction: 1.8 Gy
Plan Total Fractions Prescribed: 25
Plan Total Prescribed Dose: 45 Gy
Reference Point Dosage Given to Date: 30.6 Gy
Reference Point Session Dosage Given: 1.8 Gy
Session Number: 17

## 2022-12-04 ENCOUNTER — Ambulatory Visit
Admission: RE | Admit: 2022-12-04 | Discharge: 2022-12-04 | Disposition: A | Discharge status: Home / Self Care | Payer: 59 | Source: Ambulatory Visit | Attending: Radiation Oncology | Admitting: Radiation Oncology

## 2022-12-04 ENCOUNTER — Other Ambulatory Visit: Payer: Self-pay

## 2022-12-04 DIAGNOSIS — Z510 Encounter for antineoplastic radiation therapy: Secondary | ICD-10-CM | POA: Diagnosis not present

## 2022-12-04 DIAGNOSIS — C20 Malignant neoplasm of rectum: Secondary | ICD-10-CM | POA: Diagnosis not present

## 2022-12-04 DIAGNOSIS — Z95828 Presence of other vascular implants and grafts: Secondary | ICD-10-CM | POA: Diagnosis not present

## 2022-12-04 LAB — RAD ONC ARIA SESSION SUMMARY
Course Elapsed Days: 23
Plan Fractions Treated to Date: 18
Plan Prescribed Dose Per Fraction: 1.8 Gy
Plan Total Fractions Prescribed: 25
Plan Total Prescribed Dose: 45 Gy
Reference Point Dosage Given to Date: 32.4 Gy
Reference Point Session Dosage Given: 1.8 Gy
Session Number: 18

## 2022-12-07 ENCOUNTER — Other Ambulatory Visit: Payer: Self-pay

## 2022-12-07 ENCOUNTER — Ambulatory Visit
Admission: RE | Admit: 2022-12-07 | Discharge: 2022-12-07 | Disposition: A | Discharge status: Home / Self Care | Payer: 59 | Source: Ambulatory Visit | Attending: Radiation Oncology | Admitting: Radiation Oncology

## 2022-12-07 DIAGNOSIS — C20 Malignant neoplasm of rectum: Secondary | ICD-10-CM | POA: Diagnosis not present

## 2022-12-07 DIAGNOSIS — Z95828 Presence of other vascular implants and grafts: Secondary | ICD-10-CM | POA: Diagnosis not present

## 2022-12-07 DIAGNOSIS — Z510 Encounter for antineoplastic radiation therapy: Secondary | ICD-10-CM | POA: Diagnosis not present

## 2022-12-07 LAB — RAD ONC ARIA SESSION SUMMARY
Course Elapsed Days: 26
Plan Fractions Treated to Date: 19
Plan Prescribed Dose Per Fraction: 1.8 Gy
Plan Total Fractions Prescribed: 25
Plan Total Prescribed Dose: 45 Gy
Reference Point Dosage Given to Date: 34.2 Gy
Reference Point Session Dosage Given: 1.8 Gy
Session Number: 19

## 2022-12-07 NOTE — Progress Notes (Signed)
[facility] [address], [place] [address]    Clinic Day:  12/08/2022  Referring physician: [facility]  Patient Care Team: [facility] as PCP - General [name], MD as PCP - Cardiology (Cardiology) Doreatha Massed, MD as Medical Oncologist (Medical Oncology) Therese Sarah, RN as Oncology Nurse Navigator (Medical Oncology)   ASSESSMENT & PLAN:   Assessment: 1.  Stage IIIB (T3cN1) rectal adenocarcinoma, MSI stable: - Presentation with rectal bleeding - Colonoscopy (06/08/2022): Fungating partially obstructing large mass found in the rectum 11 cm from anal verge.  Partially circumferential involving one half of the lumen.  Mass measured 3 cm in length.  Diameter 2 cm.  Oozing present.  1 semipedunculated polyp (40 cm from anus) and 3 sessile polyps in the cecum removed. - Pathology (06/08/2022): Rectal mass 11 cm from anal verge-adenocarcinoma.  MMR preserved.  MSI-stable. - CEA (06/08/2022): 2.9 - MRI pelvis (06/10/2022): T3CN1, tumor extends into sigmoid from the high rectum, extension through muscularis propria, approximately 6 mm beyond the rectal wall.  Tumor begins in the highly portion of the rectum and extends into the sigmoid colon.  Extramural vascular invasion/tumor thrombus along the right lateral margin.  Shortest distance of tumor from mesorectal fascia: 15 mm.  Single high mesorectal or superior rectal lymph node measuring 9 mm.  Distance from tumor to internal anal sphincter is approximately 7-11 cm. - CT CAP (06/10/2022): Circumferential wall thickening of the upper/mid rectum, mildly enlarged high perirectal lymph nodes measuring up to 6 mm in short axis.  No evidence of distant metastatic disease in the chest, abdomen or pelvis. - Neoadjuvant FOLFOX 8 cycles from 06/29/2022 through 10/05/2022 - MRI pelvis (10/13/2022): Evidence of decreased extramural vascular invasion along the right lateral margin.  Tumor  length 3.4 cm, previously 4.5 cm.  Significant interval decrease thickening of high rectum/low sigmoid colon.  Mesorectal lymph nodes 7 mm, previously 9 mm in 4 mm node.  I discussed with radiologist who felt that there is more than 20% clinical improvement. - I have recommended foregoing radiation based on Prospect trial data.  He met with Dr. Cliffton Asters and Dr. Mitzi Hansen who have recommended chemoradiation. - Chemoradiation with Xeloda started on 11/11/2022.   2.  Social/family history: - He lives at home with his wife [name].  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  Denies any chemical exposure.  Does not smoke cigarettes but dips tobacco. - Father had colon cancer at age 52. - Paternal grandmother died of colon cancer. - Paternal aunt had colon cancer. - Mother had "male cancer" and underwent hysterectomy.   Plan: 1.  Stage IIIb (T3CN1) rectal adenocarcinoma, MSI-stable: - Xeloda and XRT started on 11/11/2022. - Examination of the palms: Skin peeling with mild erythema.  No blistering.  He denies any tenderness or pain. - I have recommended wearing gloves at work. - I have also recommended wearing sunscreen and/or hat as he had erythema in the malar region when he worked outside. [employer] today: Normal LFTs with mildly elevated total bilirubin 1.3.  Creatinine normal.  Potassium is mildly low.  White count is 3.8 with ANC of 2.8.  Mild thrombocytopenia stable at 147. - Continue Xeloda 1500 mg every 12 hours Monday through Friday.  He will complete XRT next week.  RTC 1 week for follow-up.   2.  Nausea/vomiting: - Continue Zofran and Phenergan as needed.   3.  Peripheral neuropathy: - Constant numbness in the fingertips,  right foot is stable.  No neuropathic pains.  Will closely monitor..  Orders Placed This Encounter  Procedures   CBC with Differential/Platelet    Standing Status:   Future    Standing Expiration Date:   12/08/2023    Order Specific Question:   Release to patient     Answer:   Immediate   Comprehensive metabolic panel    Standing Status:   Future    Standing Expiration Date:   12/08/2023    Order Specific Question:   Release to patient    Answer:   Immediate   Magnesium    Standing Status:   Future    Standing Expiration Date:   12/08/2023    Order Specific Question:   Release to patient    Answer:   Immediate     I,Alexis Herring,acting as a scribe for Doreatha Massed, MD.,have documented all relevant documentation on the behalf of Doreatha Massed, MD,as directed by  Doreatha Massed, MD while in the presence of Doreatha Massed, MD.  I, Doreatha Massed MD, have reviewed the above documentation for accuracy and completeness, and I agree with the above.    Doreatha Massed, MD   4/16/20245:03 PM  CHIEF COMPLAINT:   Diagnosis: stage III rectal cancer     Cancer Staging  Rectal carcinoma Staging form: Colon and Rectum, AJCC 8th Edition - Clinical stage from 06/18/2022: Stage IIIB (cT3, cN1, cM0) - Unsigned    Prior Therapy: FOLFOX   Current Therapy:  Xeloda, radiation    HISTORY OF PRESENT ILLNESS:   Oncology History  Rectal carcinoma  06/17/2022 Initial Diagnosis   Rectal carcinoma (HCC)   06/29/2022 -  Chemotherapy   Patient is on Treatment Plan : COLORECTAL FOLFOX q14d x 4 months      Genetic Testing   [facility] Common Cancer Panel+RNA was Negative. Report date is 08/29/2022.  The Common Hereditary Cancers Panel offered by [facility] includes sequencing and/or deletion duplication testing of the following 48 genes: APC, ATM, AXIN2, BAP1, BARD1, BMPR1A, BRCA1, BRCA2, BRIP1, CDH1, CDK4, CDKN2A (p14ARF and p16INK4a only), CHEK2, CTNNA1, DICER1, EPCAM (Deletion/duplication testing only), FH, GREM1 (promoter region duplication testing only), HOXB13, KIT, MBD4, MEN1, MLH1, MSH2, MSH3, MSH6, MUTYH, NF1, NHTL1, PALB2, PDGFRA, PMS2, POLD1, POLE, PTEN, RAD51C, RAD51D, SDHA (sequencing analysis only except exon 14), SDHB, SDHC,  SDHD, SMAD4, SMARCA4. STK11, TP53, TSC1, TSC2, and VHL.      INTERVAL HISTORY:   Troy Dillon is a 52 y.o. male presenting to clinic today for follow up of stage III rectal cancer. He was last seen by me on 12/01/22.  Today, he states that he is doing well overall. His appetite level is at 100%. His energy level is at 85%.   PAST MEDICAL HISTORY:   Past Medical History: Past Medical History:  Diagnosis Date   Rectal cancer 06/08/2022    Surgical History: Past Surgical History:  Procedure Laterality Date   COLONOSCOPY  06/08/2022   EYE SURGERY     FRACTURE SURGERY     Right Femur- Cables in bone remain in place, rod removed in 2000   IR IMAGING GUIDED PORT INSERTION  06/22/2022   ORTHOPEDIC SURGERY      Social History: Social History   Socioeconomic History   Marital status: Married    Spouse name: Not on file   Number of children: Not on file   Years of education: Not on file   Highest education level: Not on file  Occupational History   Not on file  Tobacco Use   Smoking status: Former    Types: Cigarettes    Quit date: 2003    Years since quitting: 21.3   Smokeless tobacco: Current    Types: Snuff   Tobacco comments:    Some Dipping  Vaping Use   Vaping Use: Never used  Substance and Sexual Activity   Alcohol use: Yes    Comment: occasionally   Drug use: Never   Sexual activity: Not on file  Other Topics Concern   Not on file  Social History Narrative   Not on file   Social Determinants of Health   Financial Resource Strain: Not on file  Food Insecurity: No Food Insecurity (10/28/2022)   Hunger Vital Sign    Worried About Running Out of Food in the Last Year: Never true    Ran Out of Food in the Last Year: Never true  Transportation Needs: Not on file  Physical Activity: Not on file  Stress: Not on file  Social Connections: Not on file  Intimate Partner Violence: Not At Risk (10/28/2022)   Humiliation, Afraid, Rape, and Kick questionnaire    Fear of  Current or Ex-Partner: No    Emotionally Abused: No    Physically Abused: No    Sexually Abused: No    Family History: Family History  Problem Relation Age of Onset   Cancer Mother 37 - 41       GYN malignancy details unknown, TAH/BSO   Colon cancer Father 41   Heart attack Father    Colon cancer Paternal Aunt 32   Colon cancer Paternal Grandmother 13    Current Medications:  Current Outpatient Medications:    capecitabine (XELODA) 500 MG tablet, Take 3 tablets (1,500 mg total) by mouth 2 (two) times daily after a meal. Take Monday-Friday. Take only on days of radiation., Disp: 168 tablet, Rfl: 0   dextrose 5 % SOLN 1,000 mL with fluorouracil 5 GM/100ML SOLN, Inject into the vein over 48 hr. Every 14 days, Disp: , Rfl:    escitalopram (LEXAPRO) 10 MG tablet, Take 1 tablet (10 mg total) by mouth daily., Disp: 30 tablet, Rfl: 3   fluconazole (DIFLUCAN) 100 MG tablet, Take 100 mg by mouth daily., Disp: , Rfl:    FLUOROURACIL IV, Inject into the vein every 14 (fourteen) days., Disp: , Rfl:    ibuprofen (ADVIL) 200 MG tablet, Take 200 mg by mouth every 6 (six) hours as needed for moderate pain., Disp: , Rfl:    LEUCOVORIN CALCIUM IV, Inject into the vein every 14 (fourteen) days., Disp: , Rfl:    lidocaine-prilocaine (EMLA) cream, Apply a small amount to port a cath site and cover with plastic wrap one hour prior to infusion appointments, Disp: 30 g, Rfl: 3   megestrol (MEGACE) 400 MG/10ML suspension, Take 10 mLs (400 mg total) by mouth 2 (two) times daily., Disp: 480 mL, Rfl: 3   Multiple Vitamin (MULTIVITAMIN) tablet, Take 1 tablet by mouth daily., Disp: , Rfl:    ondansetron (ZOFRAN) 8 MG tablet, Take 1 tablet (8 mg total) by mouth every 8 (eight) hours as needed., Disp: 60 tablet, Rfl: 2   OXALIPLATIN IV, Inject into the vein every 14 (fourteen) days., Disp: , Rfl:    prochlorperazine (COMPAZINE) 10 MG tablet, Take 1 tablet (10 mg total) by mouth every 6 (six) hours as needed for  nausea or vomiting., Disp: 30 tablet, Rfl: 1   promethazine (PHENERGAN) 25 MG suppository, Place 1 suppository (25 mg total)  rectally every 6 (six) hours as needed for nausea or vomiting., Disp: 120 each, Rfl: 2   Salicylic Acid, Acne, (SALICYLIC ACID EX), Apply 1 Application topically daily as needed (acne)., Disp: , Rfl:    sodium chloride (OCEAN) 0.65 % SOLN nasal spray, Place 1 spray into both nostrils as needed for congestion., Disp: , Rfl:    Allergies: Allergies  Allergen Reactions   Hydrocodone Itching   Latex     Eye irritation     REVIEW OF SYSTEMS:   Review of Systems  Constitutional:  Negative for chills, fatigue and fever.  HENT:   Negative for lump/mass, mouth sores, nosebleeds, sore throat and trouble swallowing.   Eyes:  Negative for eye problems.  Respiratory:  Negative for cough and shortness of breath.   Cardiovascular:  Negative for chest pain, leg swelling and palpitations.  Gastrointestinal:  Negative for abdominal pain, constipation, diarrhea, nausea and vomiting.  Genitourinary:  Negative for bladder incontinence, difficulty urinating, dysuria, frequency, hematuria and nocturia.   Musculoskeletal:  Negative for arthralgias, back pain, flank pain, myalgias and neck pain.  Skin:  Negative for itching and rash.  Neurological:  Positive for numbness. Negative for dizziness and headaches.  Hematological:  Does not bruise/bleed easily.  Psychiatric/Behavioral:  Negative for depression, sleep disturbance and suicidal ideas. The patient is not nervous/anxious.   All other systems reviewed and are negative.    VITALS:   There were no vitals taken for this visit.  Wt Readings from Last 3 Encounters:  12/01/22 181 lb 4.8 oz (82.2 kg)  11/24/22 182 lb 6.4 oz (82.7 kg)  11/17/22 182 lb 15.7 oz (83 kg)    There is no height or weight on file to calculate BMI.  Performance status (ECOG): 0 - Asymptomatic  PHYSICAL EXAM:   Physical Exam Vitals and nursing note  reviewed. Exam conducted with a chaperone present.  Constitutional:      Appearance: Normal appearance.  Cardiovascular:     Rate and Rhythm: Normal rate and regular rhythm.     Pulses: Normal pulses.     Heart sounds: Normal heart sounds.  Pulmonary:     Effort: Pulmonary effort is normal.     Breath sounds: Normal breath sounds.  Abdominal:     Palpations: Abdomen is soft. There is no hepatomegaly, splenomegaly or mass.     Tenderness: There is no abdominal tenderness.  Musculoskeletal:     Right lower leg: No edema.     Left lower leg: No edema.  Lymphadenopathy:     Cervical: No cervical adenopathy.     Right cervical: No superficial, deep or posterior cervical adenopathy.    Left cervical: No superficial, deep or posterior cervical adenopathy.     Upper Body:     Right upper body: No supraclavicular or axillary adenopathy.     Left upper body: No supraclavicular or axillary adenopathy.  Neurological:     General: No focal deficit present.     Mental Status: He is alert and oriented to person, place, and time.  Psychiatric:        Mood and Affect: Mood normal.        Behavior: Behavior normal.     LABS:      Latest Ref Rng & Units 12/08/2022    1:31 PM 12/01/2022   10:37 AM 11/24/2022   10:05 AM  CBC  WBC 4.0 - 10.5 K/uL 3.8  2.8  3.1   Hemoglobin 13.0 - 17.0 g/dL 94.3  27.6  13.8   Hematocrit 39.0 - 52.0 % 37.2  38.4  38.8   Platelets 150 - 400 K/uL 147  144  131       Latest Ref Rng & Units 12/08/2022    1:31 PM 12/01/2022   10:37 AM 11/24/2022   10:05 AM  CMP  Glucose 70 - 99 mg/dL 630  160  109   BUN 6 - 20 mg/dL 14  13  13    Creatinine 0.61 - 1.24 mg/dL 3.23  5.57  3.22   Sodium 135 - 145 mmol/L 136  133  133   Potassium 3.5 - 5.1 mmol/L 3.3  3.9  3.5   Chloride 98 - 111 mmol/L 106  105  104   CO2 22 - 32 mmol/L 22  23  23    Calcium 8.9 - 10.3 mg/dL 8.6  8.7  8.9   Total Protein 6.5 - 8.1 g/dL 7.4  7.2  7.4   Total Bilirubin 0.3 - 1.2 mg/dL 1.3  0.9  1.0    Alkaline Phos 38 - 126 U/L 114  107  101   AST 15 - 41 U/L 28  25  27    ALT 0 - 44 U/L 22  20  24       No results found for: "CEA1", "CEA" / No results found for: "CEA1", "CEA" No results found for: "PSA1" No results found for: "GUR427" No results found for: "CAN125"  No results found for: "TOTALPROTELP", "ALBUMINELP", "A1GS", "A2GS", "BETS", "BETA2SER", "GAMS", "MSPIKE", "SPEI" No results found for: "TIBC", "FERRITIN", "IRONPCTSAT" No results found for: "LDH"   STUDIES:   No results found.

## 2022-12-08 ENCOUNTER — Ambulatory Visit
Admission: RE | Admit: 2022-12-08 | Discharge: 2022-12-08 | Disposition: A | Discharge status: Home / Self Care | Payer: 59 | Source: Ambulatory Visit | Attending: Radiation Oncology | Admitting: Radiation Oncology

## 2022-12-08 ENCOUNTER — Inpatient Hospital Stay: Payer: 59

## 2022-12-08 ENCOUNTER — Inpatient Hospital Stay (HOSPITAL_BASED_OUTPATIENT_CLINIC_OR_DEPARTMENT_OTHER): Payer: 59 | Admitting: Hematology

## 2022-12-08 ENCOUNTER — Other Ambulatory Visit: Payer: Self-pay

## 2022-12-08 VITALS — BP 149/77 | HR 104 | Temp 98.0°F | Resp 20

## 2022-12-08 DIAGNOSIS — C20 Malignant neoplasm of rectum: Secondary | ICD-10-CM

## 2022-12-08 DIAGNOSIS — Z510 Encounter for antineoplastic radiation therapy: Secondary | ICD-10-CM | POA: Diagnosis not present

## 2022-12-08 DIAGNOSIS — Z95828 Presence of other vascular implants and grafts: Secondary | ICD-10-CM | POA: Diagnosis not present

## 2022-12-08 LAB — COMPREHENSIVE METABOLIC PANEL
ALT: 22 U/L (ref 0–44)
AST: 28 U/L (ref 15–41)
Albumin: 3.8 g/dL (ref 3.5–5.0)
Alkaline Phosphatase: 114 U/L (ref 38–126)
Anion gap: 8 (ref 5–15)
BUN: 14 mg/dL (ref 6–20)
CO2: 22 mmol/L (ref 22–32)
Calcium: 8.6 mg/dL — ABNORMAL LOW (ref 8.9–10.3)
Chloride: 106 mmol/L (ref 98–111)
Creatinine, Ser: 0.98 mg/dL (ref 0.61–1.24)
GFR, Estimated: >60 mL/min (ref >60)
Glucose, Bld: 157 mg/dL — ABNORMAL HIGH (ref 70–99)
Potassium: 3.3 mmol/L — ABNORMAL LOW (ref 3.5–5.1)
Sodium: 136 mmol/L (ref 135–145)
Total Bilirubin: 1.3 mg/dL — ABNORMAL HIGH (ref 0.3–1.2)
Total Protein: 7.4 g/dL (ref 6.5–8.1)

## 2022-12-08 LAB — RAD ONC ARIA SESSION SUMMARY
Course Elapsed Days: 27
Plan Fractions Treated to Date: 20
Plan Prescribed Dose Per Fraction: 1.8 Gy
Plan Total Fractions Prescribed: 25
Plan Total Prescribed Dose: 45 Gy
Reference Point Dosage Given to Date: 36 Gy
Reference Point Session Dosage Given: 1.8 Gy
Session Number: 20

## 2022-12-08 LAB — CBC WITH DIFFERENTIAL/PLATELET
Abs Immature Granulocytes: 0.01 10*3/uL (ref 0.00–0.07)
Basophils Absolute: 0 10*3/uL (ref 0.0–0.1)
Basophils Relative: 1 %
Eosinophils Absolute: 0.1 10*3/uL (ref 0.0–0.5)
Eosinophils Relative: 3 %
HCT: 37.2 % — ABNORMAL LOW (ref 39.0–52.0)
Hemoglobin: 13.6 g/dL (ref 13.0–17.0)
Immature Granulocytes: 0 %
Lymphocytes Relative: 12 %
Lymphs Abs: 0.4 10*3/uL — ABNORMAL LOW (ref 0.7–4.0)
MCH: 34.5 pg — ABNORMAL HIGH (ref 26.0–34.0)
MCHC: 36.6 g/dL — ABNORMAL HIGH (ref 30.0–36.0)
MCV: 94.4 fL (ref 80.0–100.0)
Monocytes Absolute: 0.4 10*3/uL (ref 0.1–1.0)
Monocytes Relative: 10 %
Neutro Abs: 2.8 10*3/uL (ref 1.7–7.7)
Neutrophils Relative %: 74 %
Platelets: 147 10*3/uL — ABNORMAL LOW (ref 150–400)
RBC: 3.94 MIL/uL — ABNORMAL LOW (ref 4.22–5.81)
RDW: 15.4 % (ref 11.5–15.5)
WBC: 3.8 10*3/uL — ABNORMAL LOW (ref 4.0–10.5)
nRBC: 0 % (ref 0.0–0.2)

## 2022-12-08 LAB — MAGNESIUM: Magnesium: 1.8 mg/dL (ref 1.7–2.4)

## 2022-12-08 MED ORDER — HEPARIN SOD (PORK) LOCK FLUSH 100 UNIT/ML IV SOLN
500.0000 [IU] | Freq: Once | INTRAVENOUS | Status: AC
  Administered 2022-12-08: 500 [IU] via INTRAVENOUS

## 2022-12-08 MED ORDER — SODIUM CHLORIDE 0.9% FLUSH
10.0000 mL | Freq: Once | INTRAVENOUS | Status: AC
  Administered 2022-12-08: 10 mL via INTRAVENOUS

## 2022-12-08 NOTE — Progress Notes (Signed)
Port flushed with good blood return noted. No bruising or swelling at site. Bandaid applied and patient discharged in satisfactory condition. VVS stable with no signs or symptoms of distressed noted. 

## 2022-12-08 NOTE — Patient Instructions (Signed)
[facility]  Discharge Instructions  You were seen and examined today by Dr. Ellin Saba.  Dr. Ellin Saba discussed your most recent lab work which revealed that everything looks good and stable.  Follow-up as scheduled in 1 week.    Thank you for choosing [facility] to provide your oncology and hematology care.   To afford each patient quality time with our provider, please arrive at least 15 minutes before your scheduled appointment time. You may need to reschedule your appointment if you arrive late (10 or more minutes). Arriving late affects you and other patients whose appointments are after yours.  Also, if you miss three or more appointments without notifying the office, you may be dismissed from the clinic at the provider's discretion.    Again, thank you for choosing [facility].  Our hope is that these requests will decrease the amount of time that you wait before being seen by our physicians.   If you have a lab appointment with the Cancer Center - please note that after April 8th, all labs will be drawn in the cancer center.  You do not have to check in or register with the main entrance as you have in the past but will complete your check-in at the cancer center.            _____________________________________________________________  Should you have questions after your visit to [facility], please contact our office at [phone] and follow the prompts.  Our office hours are 8:00 a.m. to 4:30 p.m. Monday - Thursday and 8:00 a.m. to 2:30 p.m. Friday.  Please note that voicemails left after 4:00 p.m. may not be returned until the following business day.  We are closed weekends and all major holidays.  You do have access to a nurse 24-7, just call the main number to the clinic [phone] and do not press any options, hold on the line and a nurse will answer the phone.    For prescription refill  requests, have your pharmacy contact our office and allow 72 hours.    Masks are no longer required in the cancer centers. If you would like for your care team to wear a mask while they are taking care of you, please let them know. You may have one support person who is at least 52 years old accompany you for your appointments.

## 2022-12-09 ENCOUNTER — Other Ambulatory Visit: Payer: Self-pay

## 2022-12-09 ENCOUNTER — Ambulatory Visit
Admission: RE | Admit: 2022-12-09 | Discharge: 2022-12-09 | Disposition: A | Discharge status: Home / Self Care | Payer: 59 | Source: Ambulatory Visit | Attending: Radiation Oncology | Admitting: Radiation Oncology

## 2022-12-09 DIAGNOSIS — Z510 Encounter for antineoplastic radiation therapy: Secondary | ICD-10-CM | POA: Diagnosis not present

## 2022-12-09 DIAGNOSIS — C20 Malignant neoplasm of rectum: Secondary | ICD-10-CM | POA: Diagnosis not present

## 2022-12-09 DIAGNOSIS — Z95828 Presence of other vascular implants and grafts: Secondary | ICD-10-CM | POA: Diagnosis not present

## 2022-12-09 LAB — RAD ONC ARIA SESSION SUMMARY
Course Elapsed Days: 28
Plan Fractions Treated to Date: 21
Plan Prescribed Dose Per Fraction: 1.8 Gy
Plan Total Fractions Prescribed: 25
Plan Total Prescribed Dose: 45 Gy
Reference Point Dosage Given to Date: 37.8 Gy
Reference Point Session Dosage Given: 1.8 Gy
Session Number: 21

## 2022-12-10 ENCOUNTER — Ambulatory Visit
Admission: RE | Admit: 2022-12-10 | Discharge: 2022-12-10 | Disposition: A | Discharge status: Home / Self Care | Payer: No Typology Code available for payment source | Source: Ambulatory Visit | Attending: Radiation Oncology | Admitting: Radiation Oncology

## 2022-12-10 ENCOUNTER — Ambulatory Visit: Payer: 59

## 2022-12-10 ENCOUNTER — Other Ambulatory Visit: Payer: Self-pay

## 2022-12-10 DIAGNOSIS — Z95828 Presence of other vascular implants and grafts: Secondary | ICD-10-CM | POA: Diagnosis not present

## 2022-12-10 DIAGNOSIS — Z510 Encounter for antineoplastic radiation therapy: Secondary | ICD-10-CM | POA: Diagnosis not present

## 2022-12-10 DIAGNOSIS — C20 Malignant neoplasm of rectum: Secondary | ICD-10-CM | POA: Diagnosis not present

## 2022-12-10 LAB — RAD ONC ARIA SESSION SUMMARY
Course Elapsed Days: 29
Plan Fractions Treated to Date: 22
Plan Prescribed Dose Per Fraction: 1.8 Gy
Plan Total Fractions Prescribed: 25
Plan Total Prescribed Dose: 45 Gy
Reference Point Dosage Given to Date: 39.6 Gy
Reference Point Session Dosage Given: 1.8 Gy
Session Number: 22

## 2022-12-11 ENCOUNTER — Ambulatory Visit: Payer: 59

## 2022-12-11 ENCOUNTER — Other Ambulatory Visit: Payer: Self-pay

## 2022-12-11 ENCOUNTER — Ambulatory Visit
Admission: RE | Admit: 2022-12-11 | Discharge: 2022-12-11 | Disposition: A | Discharge status: Home / Self Care | Payer: 59 | Source: Ambulatory Visit | Attending: Radiation Oncology | Admitting: Radiation Oncology

## 2022-12-11 DIAGNOSIS — Z95828 Presence of other vascular implants and grafts: Secondary | ICD-10-CM | POA: Diagnosis not present

## 2022-12-11 DIAGNOSIS — C20 Malignant neoplasm of rectum: Secondary | ICD-10-CM | POA: Diagnosis not present

## 2022-12-11 DIAGNOSIS — Z510 Encounter for antineoplastic radiation therapy: Secondary | ICD-10-CM | POA: Diagnosis not present

## 2022-12-11 LAB — RAD ONC ARIA SESSION SUMMARY
Course Elapsed Days: 30
Plan Fractions Treated to Date: 23
Plan Prescribed Dose Per Fraction: 1.8 Gy
Plan Total Fractions Prescribed: 25
Plan Total Prescribed Dose: 45 Gy
Reference Point Dosage Given to Date: 41.4 Gy
Reference Point Session Dosage Given: 1.8 Gy
Session Number: 23

## 2022-12-13 NOTE — Progress Notes (Signed)
[facility] [address], [place] [address]    Clinic Day:  12/14/2022  Referring physician: [facility]  Patient Care Team: [facility] as PCP - General [name], MD as PCP - Cardiology (Cardiology) Doreatha Massed, MD as Medical Oncologist (Medical Oncology) Therese Sarah, RN as Oncology Nurse Navigator (Medical Oncology)   ASSESSMENT & PLAN:   Assessment: 1.  Stage IIIB (T3cN1) rectal adenocarcinoma, MSI stable: - Presentation with rectal bleeding - Colonoscopy (06/08/2022): Fungating partially obstructing large mass found in the rectum 11 cm from anal verge.  Partially circumferential involving one half of the lumen.  Mass measured 3 cm in length.  Diameter 2 cm.  Oozing present.  1 semipedunculated polyp (40 cm from anus) and 3 sessile polyps in the cecum removed. - Pathology (06/08/2022): Rectal mass 11 cm from anal verge-adenocarcinoma.  MMR preserved.  MSI-stable. - CEA (06/08/2022): 2.9 - MRI pelvis (06/10/2022): T3CN1, tumor extends into sigmoid from the high rectum, extension through muscularis propria, approximately 6 mm beyond the rectal wall.  Tumor begins in the highly portion of the rectum and extends into the sigmoid colon.  Extramural vascular invasion/tumor thrombus along the right lateral margin.  Shortest distance of tumor from mesorectal fascia: 15 mm.  Single high mesorectal or superior rectal lymph node measuring 9 mm.  Distance from tumor to internal anal sphincter is approximately 7-11 cm. - CT CAP (06/10/2022): Circumferential wall thickening of the upper/mid rectum, mildly enlarged high perirectal lymph nodes measuring up to 6 mm in short axis.  No evidence of distant metastatic disease in the chest, abdomen or pelvis. - Neoadjuvant FOLFOX 8 cycles from 06/29/2022 through 10/05/2022 - MRI pelvis (10/13/2022): Evidence of decreased extramural vascular invasion along the right lateral margin.  Tumor  length 3.4 cm, previously 4.5 cm.  Significant interval decrease thickening of high rectum/low sigmoid colon.  Mesorectal lymph nodes 7 mm, previously 9 mm in 4 mm node.  I discussed with radiologist who felt that there is more than 20% clinical improvement. - I have recommended foregoing radiation based on Prospect trial data.  He met with Dr. Cliffton Asters and Dr. Mitzi Hansen who have recommended chemoradiation. - Chemoradiation with Xeloda from 11/11/2022 through 12/18/2022.   2.  Social/family history: - He lives at home with his wife Troy Dillon.  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  Denies any chemical exposure.  Does not smoke cigarettes but dips tobacco. - Father had colon cancer at age 36. - Paternal grandmother died of colon cancer. - Paternal aunt had colon cancer. - Mother had "male cancer" and underwent hysterectomy.    Plan: 1.  Stage IIIb (T3CN1) rectal adenocarcinoma, MSI-stable: - Xeloda and XRT started on 11/11/2022. - Examination of the palms: Skin peeling with mild erythema with no blistering.  Denies any pain or tenderness. - Labs today: Normal LFTs except total bilirubin 1.4.  Creatinine and electrolytes normal.  Mild hyponatremia.  Mild leukopenia without neutropenia. - Continue Xeloda 1500 mg twice daily.  He will stop Xeloda on 12/18/2022. - He has a follow-up with Dr. Cliffton Asters next week. - I have recommended follow-up in 5 weeks with CT CAP repeated prior to next visit.  Will also check CEA level.   2.  Nausea/vomiting: - Continue Zofran and Phenergan as needed.   3.  Peripheral neuropathy: - He reported slight worsening of the numbness in the thumbs bilaterally.  Numbness in the rest of the fingers and the right foot  is stable.  No neuropathic pains.  Orders Placed This Encounter  Procedures   CT CHEST ABDOMEN PELVIS W CONTRAST    Standing Status:   Future    Standing Expiration Date:   12/14/2023    Order Specific Question:   If indicated for the ordered procedure, I  authorize the administration of contrast media per Radiology protocol    Answer:   Yes    Order Specific Question:   Does the patient have a contrast media/X-ray dye allergy?    Answer:   No    Order Specific Question:   Preferred imaging location?    Answer:   [facility]    Order Specific Question:   If indicated for the ordered procedure, I authorize the administration of oral contrast media per Radiology protocol    Answer:   Yes   CBC with Differential    Standing Status:   Future    Standing Expiration Date:   12/14/2023   Comprehensive metabolic panel    Standing Status:   Future    Standing Expiration Date:   12/14/2023   Magnesium    Standing Status:   Future    Standing Expiration Date:   12/14/2023   CEA    Standing Status:   Future    Standing Expiration Date:   12/14/2023      I,Katie Daubenspeck,acting as a scribe for Doreatha Massed, MD.,have documented all relevant documentation on the behalf of Doreatha Massed, MD,as directed by  Doreatha Massed, MD while in the presence of Doreatha Massed, MD.   I, Doreatha Massed MD, have reviewed the above documentation for accuracy and completeness, and I agree with the above.   Doreatha Massed, MD   4/22/202411:36 AM  CHIEF COMPLAINT:   Diagnosis: stage III rectal cancer     Cancer Staging  Rectal carcinoma Staging form: Colon and Rectum, AJCC 8th Edition - Clinical stage from 06/18/2022: Stage IIIB (cT3, cN1, cM0) - Unsigned    Prior Therapy: FOLFOX   Current Therapy:  Xeloda, radiation    HISTORY OF PRESENT ILLNESS:   Oncology History  Rectal carcinoma  06/17/2022 Initial Diagnosis   Rectal carcinoma (HCC)   06/29/2022 -  Chemotherapy   Patient is on Treatment Plan : COLORECTAL FOLFOX q14d x 4 months      Genetic Testing   [facility] Common Cancer Panel+RNA was Negative. Report date is 08/29/2022.  The Common Hereditary Cancers Panel offered by [facility] includes sequencing  and/or deletion duplication testing of the following 48 genes: APC, ATM, AXIN2, BAP1, BARD1, BMPR1A, BRCA1, BRCA2, BRIP1, CDH1, CDK4, CDKN2A (p14ARF and p16INK4a only), CHEK2, CTNNA1, DICER1, EPCAM (Deletion/duplication testing only), FH, GREM1 (promoter region duplication testing only), HOXB13, KIT, MBD4, MEN1, MLH1, MSH2, MSH3, MSH6, MUTYH, NF1, NHTL1, PALB2, PDGFRA, PMS2, POLD1, POLE, PTEN, RAD51C, RAD51D, SDHA (sequencing analysis only except exon 14), SDHB, SDHC, SDHD, SMAD4, SMARCA4. STK11, TP53, TSC1, TSC2, and VHL.      INTERVAL HISTORY:   Troy Dillon is a 52 y.o. male presenting to clinic today for follow up of stage III rectal cancer. He was last seen by me on 12/08/22.  Today, he states that he is doing well overall. His appetite level is at 80%. His energy level is at 50%.  PAST MEDICAL HISTORY:   Past Medical History: Past Medical History:  Diagnosis Date   Rectal cancer 06/08/2022    Surgical History: Past Surgical History:  Procedure Laterality Date   COLONOSCOPY  06/08/2022   EYE SURGERY  FRACTURE SURGERY     Right Femur- Cables in bone remain in place, rod removed in 2000   IR IMAGING GUIDED PORT INSERTION  06/22/2022   ORTHOPEDIC SURGERY      Social History: Social History   Socioeconomic History   Marital status: Married    Spouse name: Not on file   Number of children: Not on file   Years of education: Not on file   Highest education level: Not on file  Occupational History   Not on file  Tobacco Use   Smoking status: Former    Types: Cigarettes    Quit date: 2003    Years since quitting: 21.3   Smokeless tobacco: Current    Types: Snuff   Tobacco comments:    Some Dipping  Vaping Use   Vaping Use: Never used  Substance and Sexual Activity   Alcohol use: Yes    Comment: occasionally   Drug use: Never   Sexual activity: Not on file  Other Topics Concern   Not on file  Social History Narrative   Not on file   Social Determinants of Health    Financial Resource Strain: Not on file  Food Insecurity: No Food Insecurity (10/28/2022)   Hunger Vital Sign    Worried About Running Out of Food in the Last Year: Never true    Ran Out of Food in the Last Year: Never true  Transportation Needs: Not on file  Physical Activity: Not on file  Stress: Not on file  Social Connections: Not on file  Intimate Partner Violence: Not At Risk (10/28/2022)   Humiliation, Afraid, Rape, and Kick questionnaire    Fear of Current or Ex-Partner: No    Emotionally Abused: No    Physically Abused: No    Sexually Abused: No    Family History: Family History  Problem Relation Age of Onset   Cancer Mother 5 - 71       GYN malignancy details unknown, TAH/BSO   Colon cancer Father 76   Heart attack Father    Colon cancer Paternal Aunt 68   Colon cancer Paternal Grandmother 32    Current Medications:  Current Outpatient Medications:    capecitabine (XELODA) 500 MG tablet, Take 3 tablets (1,500 mg total) by mouth 2 (two) times daily after a meal. Take Monday-Friday. Take only on days of radiation., Disp: 168 tablet, Rfl: 0   dextrose 5 % SOLN 1,000 mL with fluorouracil 5 GM/100ML SOLN, Inject into the vein over 48 hr. Every 14 days, Disp: , Rfl:    escitalopram (LEXAPRO) 10 MG tablet, Take 1 tablet (10 mg total) by mouth daily., Disp: 30 tablet, Rfl: 3   fluconazole (DIFLUCAN) 100 MG tablet, Take 100 mg by mouth daily., Disp: , Rfl:    FLUOROURACIL IV, Inject into the vein every 14 (fourteen) days., Disp: , Rfl:    ibuprofen (ADVIL) 200 MG tablet, Take 200 mg by mouth every 6 (six) hours as needed for moderate pain., Disp: , Rfl:    LEUCOVORIN CALCIUM IV, Inject into the vein every 14 (fourteen) days., Disp: , Rfl:    lidocaine-prilocaine (EMLA) cream, Apply a small amount to port a cath site and cover with plastic wrap one hour prior to infusion appointments, Disp: 30 g, Rfl: 3   megestrol (MEGACE) 400 MG/10ML suspension, Take 10 mLs (400 mg total) by  mouth 2 (two) times daily., Disp: 480 mL, Rfl: 3   Multiple Vitamin (MULTIVITAMIN) tablet, Take 1 tablet by mouth  daily., Disp: , Rfl:    ondansetron (ZOFRAN) 8 MG tablet, Take 1 tablet (8 mg total) by mouth every 8 (eight) hours as needed., Disp: 60 tablet, Rfl: 2   OXALIPLATIN IV, Inject into the vein every 14 (fourteen) days., Disp: , Rfl:    prochlorperazine (COMPAZINE) 10 MG tablet, Take 1 tablet (10 mg total) by mouth every 6 (six) hours as needed for nausea or vomiting., Disp: 30 tablet, Rfl: 1   promethazine (PHENERGAN) 25 MG suppository, Place 1 suppository (25 mg total) rectally every 6 (six) hours as needed for nausea or vomiting., Disp: 120 each, Rfl: 2   Salicylic Acid, Acne, (SALICYLIC ACID EX), Apply 1 Application topically daily as needed (acne)., Disp: , Rfl:    sodium chloride (OCEAN) 0.65 % SOLN nasal spray, Place 1 spray into both nostrils as needed for congestion., Disp: , Rfl:    Allergies: Allergies  Allergen Reactions   Hydrocodone Itching   Latex     Eye irritation     REVIEW OF SYSTEMS:   Review of Systems  Constitutional:  Negative for chills, fatigue and fever.  HENT:   Negative for lump/mass, mouth sores, nosebleeds, sore throat and trouble swallowing.   Eyes:  Negative for eye problems.  Respiratory:  Negative for cough and shortness of breath.   Cardiovascular:  Negative for chest pain, leg swelling and palpitations.  Gastrointestinal:  Positive for nausea. Negative for abdominal pain, constipation, diarrhea and vomiting.  Genitourinary:  Negative for bladder incontinence, difficulty urinating, dysuria, frequency, hematuria and nocturia.   Musculoskeletal:  Negative for arthralgias, back pain, flank pain, myalgias and neck pain.  Skin:  Negative for itching and rash.  Neurological:  Positive for numbness. Negative for dizziness and headaches.  Hematological:  Does not bruise/bleed easily.  Psychiatric/Behavioral:  Negative for depression, sleep  disturbance and suicidal ideas. The patient is not nervous/anxious.   All other systems reviewed and are negative.    VITALS:   There were no vitals taken for this visit.  Wt Readings from Last 3 Encounters:  12/01/22 181 lb 4.8 oz (82.2 kg)  11/24/22 182 lb 6.4 oz (82.7 kg)  11/17/22 182 lb 15.7 oz (83 kg)    There is no height or weight on file to calculate BMI.  Performance status (ECOG): 0 - Asymptomatic  PHYSICAL EXAM:   Physical Exam Vitals and nursing note reviewed. Exam conducted with a chaperone present.  Constitutional:      Appearance: Normal appearance.  Cardiovascular:     Rate and Rhythm: Normal rate and regular rhythm.     Pulses: Normal pulses.     Heart sounds: Normal heart sounds.  Pulmonary:     Effort: Pulmonary effort is normal.     Breath sounds: Normal breath sounds.  Abdominal:     Palpations: Abdomen is soft. There is no hepatomegaly, splenomegaly or mass.     Tenderness: There is no abdominal tenderness.  Musculoskeletal:     Right lower leg: No edema.     Left lower leg: No edema.  Lymphadenopathy:     Cervical: No cervical adenopathy.     Right cervical: No superficial, deep or posterior cervical adenopathy.    Left cervical: No superficial, deep or posterior cervical adenopathy.     Upper Body:     Right upper body: No supraclavicular or axillary adenopathy.     Left upper body: No supraclavicular or axillary adenopathy.  Neurological:     General: No focal deficit present.  Mental Status: He is alert and oriented to person, place, and time.  Psychiatric:        Mood and Affect: Mood normal.        Behavior: Behavior normal.     LABS:      Latest Ref Rng & Units 12/14/2022   10:26 AM 12/08/2022    1:31 PM 12/01/2022   10:37 AM  CBC  WBC 4.0 - 10.5 K/uL 3.7  3.8  2.8   Hemoglobin 13.0 - 17.0 g/dL 16.1  09.6  04.5   Hematocrit 39.0 - 52.0 % 37.6  37.2  38.4   Platelets 150 - 400 K/uL 164  147  144       Latest Ref Rng & Units  12/14/2022   10:26 AM 12/08/2022    1:31 PM 12/01/2022   10:37 AM  CMP  Glucose 70 - 99 mg/dL 409  811  914   BUN 6 - 20 mg/dL 13  14  13    Creatinine 0.61 - 1.24 mg/dL 7.82  9.56  2.13   Sodium 135 - 145 mmol/L 133  136  133   Potassium 3.5 - 5.1 mmol/L 3.7  3.3  3.9   Chloride 98 - 111 mmol/L 102  106  105   CO2 22 - 32 mmol/L 23  22  23    Calcium 8.9 - 10.3 mg/dL 8.8  8.6  8.7   Total Protein 6.5 - 8.1 g/dL 7.2  7.4  7.2   Total Bilirubin 0.3 - 1.2 mg/dL 1.4  1.3  0.9   Alkaline Phos 38 - 126 U/L 109  114  107   AST 15 - 41 U/L 23  28  25    ALT 0 - 44 U/L 19  22  20       No results found for: "CEA1", "CEA" / No results found for: "CEA1", "CEA" No results found for: "PSA1" No results found for: "YQM578" No results found for: "CAN125"  No results found for: "TOTALPROTELP", "ALBUMINELP", "A1GS", "A2GS", "BETS", "BETA2SER", "GAMS", "MSPIKE", "SPEI" No results found for: "TIBC", "FERRITIN", "IRONPCTSAT" No results found for: "LDH"   STUDIES:   No results found.

## 2022-12-14 ENCOUNTER — Inpatient Hospital Stay: Payer: 59 | Admitting: Hematology

## 2022-12-14 ENCOUNTER — Ambulatory Visit
Admission: RE | Admit: 2022-12-14 | Discharge: 2022-12-14 | Disposition: A | Discharge status: Home / Self Care | Payer: 59 | Source: Ambulatory Visit | Attending: Radiation Oncology | Admitting: Radiation Oncology

## 2022-12-14 ENCOUNTER — Inpatient Hospital Stay: Payer: 59

## 2022-12-14 ENCOUNTER — Other Ambulatory Visit: Payer: Self-pay

## 2022-12-14 DIAGNOSIS — C20 Malignant neoplasm of rectum: Secondary | ICD-10-CM

## 2022-12-14 DIAGNOSIS — Z95828 Presence of other vascular implants and grafts: Secondary | ICD-10-CM | POA: Diagnosis not present

## 2022-12-14 DIAGNOSIS — Z510 Encounter for antineoplastic radiation therapy: Secondary | ICD-10-CM | POA: Diagnosis not present

## 2022-12-14 LAB — COMPREHENSIVE METABOLIC PANEL
ALT: 19 U/L (ref 0–44)
AST: 23 U/L (ref 15–41)
Albumin: 3.9 g/dL (ref 3.5–5.0)
Alkaline Phosphatase: 109 U/L (ref 38–126)
Anion gap: 8 (ref 5–15)
BUN: 13 mg/dL (ref 6–20)
CO2: 23 mmol/L (ref 22–32)
Calcium: 8.8 mg/dL — ABNORMAL LOW (ref 8.9–10.3)
Chloride: 102 mmol/L (ref 98–111)
Creatinine, Ser: 0.84 mg/dL (ref 0.61–1.24)
GFR, Estimated: >60 mL/min (ref >60)
Glucose, Bld: 100 mg/dL — ABNORMAL HIGH (ref 70–99)
Potassium: 3.7 mmol/L (ref 3.5–5.1)
Sodium: 133 mmol/L — ABNORMAL LOW (ref 135–145)
Total Bilirubin: 1.4 mg/dL — ABNORMAL HIGH (ref 0.3–1.2)
Total Protein: 7.2 g/dL (ref 6.5–8.1)

## 2022-12-14 LAB — RAD ONC ARIA SESSION SUMMARY
Course Elapsed Days: 33
Plan Fractions Treated to Date: 24
Plan Prescribed Dose Per Fraction: 1.8 Gy
Plan Total Fractions Prescribed: 25
Plan Total Prescribed Dose: 45 Gy
Reference Point Dosage Given to Date: 43.2 Gy
Reference Point Session Dosage Given: 1.8 Gy
Session Number: 24

## 2022-12-14 LAB — CBC WITH DIFFERENTIAL/PLATELET
Abs Immature Granulocytes: 0.02 10*3/uL (ref 0.00–0.07)
Basophils Absolute: 0 10*3/uL (ref 0.0–0.1)
Basophils Relative: 1 %
Eosinophils Absolute: 0.1 10*3/uL (ref 0.0–0.5)
Eosinophils Relative: 4 %
HCT: 37.6 % — ABNORMAL LOW (ref 39.0–52.0)
Hemoglobin: 13.6 g/dL (ref 13.0–17.0)
Immature Granulocytes: 1 %
Lymphocytes Relative: 11 %
Lymphs Abs: 0.4 10*3/uL — ABNORMAL LOW (ref 0.7–4.0)
MCH: 34.3 pg — ABNORMAL HIGH (ref 26.0–34.0)
MCHC: 36.2 g/dL — ABNORMAL HIGH (ref 30.0–36.0)
MCV: 94.7 fL (ref 80.0–100.0)
Monocytes Absolute: 0.5 10*3/uL (ref 0.1–1.0)
Monocytes Relative: 14 %
Neutro Abs: 2.6 10*3/uL (ref 1.7–7.7)
Neutrophils Relative %: 69 %
Platelets: 164 10*3/uL (ref 150–400)
RBC: 3.97 MIL/uL — ABNORMAL LOW (ref 4.22–5.81)
RDW: 16.3 % — ABNORMAL HIGH (ref 11.5–15.5)
WBC: 3.7 10*3/uL — ABNORMAL LOW (ref 4.0–10.5)
nRBC: 0 % (ref 0.0–0.2)

## 2022-12-14 LAB — MAGNESIUM: Magnesium: 1.9 mg/dL (ref 1.7–2.4)

## 2022-12-14 MED ORDER — HEPARIN SOD (PORK) LOCK FLUSH 100 UNIT/ML IV SOLN
500.0000 [IU] | Freq: Once | INTRAVENOUS | Status: AC
  Administered 2022-12-14: 500 [IU] via INTRAVENOUS

## 2022-12-14 MED ORDER — SODIUM CHLORIDE 0.9% FLUSH
10.0000 mL | Freq: Once | INTRAVENOUS | Status: AC
  Administered 2022-12-14: 10 mL via INTRAVENOUS

## 2022-12-14 NOTE — Patient Instructions (Signed)
[facility] at [facility] Discharge Instructions   You were seen and examined today by Dr. Ellin Saba.  He reviewed the results of your lab work which are normal/stable.   Continue Xeloda as prescribed. You may stop Xeloda with the completion of radiation treatments this Friday.   Return as scheduled.    Thank you for choosing [facility] at [facility] to provide your oncology and hematology care.  To afford each patient quality time with our provider, please arrive at least 15 minutes before your scheduled appointment time.   If you have a lab appointment with the Cancer Center please come in thru the Main Entrance and check in at the main information desk.  You need to re-schedule your appointment should you arrive 10 or more minutes late.  We strive to give you quality time with our providers, and arriving late affects you and other patients whose appointments are after yours.  Also, if you no show three or more times for appointments you may be dismissed from the clinic at the providers discretion.     Again, thank you for choosing [facility].  Our hope is that these requests will decrease the amount of time that you wait before being seen by our physicians.       _____________________________________________________________  Should you have questions after your visit to [facility], please contact our office at [phone] and follow the prompts.  Our office hours are 8:00 a.m. and 4:30 p.m. Monday - Friday.  Please note that voicemails left after 4:00 p.m. may not be returned until the following business day.  We are closed weekends and major holidays.  You do have access to a nurse 24-7, just call the main number to the clinic [phone] and do not press any options, hold on the line and a nurse will answer the phone.    For prescription refill requests, have your pharmacy contact our office and allow 72  hours.    Due to Covid, you will need to wear a mask upon entering the hospital. If you do not have a mask, a mask will be given to you at the Main Entrance upon arrival. For doctor visits, patients may have 1 support person age 1 or older with them. For treatment visits, patients can not have anyone with them due to social distancing guidelines and our immunocompromised population.

## 2022-12-15 ENCOUNTER — Ambulatory Visit
Admission: RE | Admit: 2022-12-15 | Discharge: 2022-12-15 | Disposition: A | Discharge status: Home / Self Care | Payer: 59 | Source: Ambulatory Visit | Attending: Radiation Oncology | Admitting: Radiation Oncology

## 2022-12-15 ENCOUNTER — Other Ambulatory Visit: Payer: Self-pay

## 2022-12-15 DIAGNOSIS — C20 Malignant neoplasm of rectum: Secondary | ICD-10-CM | POA: Diagnosis not present

## 2022-12-15 DIAGNOSIS — Z510 Encounter for antineoplastic radiation therapy: Secondary | ICD-10-CM | POA: Diagnosis not present

## 2022-12-15 DIAGNOSIS — Z95828 Presence of other vascular implants and grafts: Secondary | ICD-10-CM | POA: Diagnosis not present

## 2022-12-15 LAB — RAD ONC ARIA SESSION SUMMARY
Course Elapsed Days: 34
Plan Fractions Treated to Date: 25
Plan Prescribed Dose Per Fraction: 1.8 Gy
Plan Total Fractions Prescribed: 25
Plan Total Prescribed Dose: 45 Gy
Reference Point Dosage Given to Date: 45 Gy
Reference Point Session Dosage Given: 1.8 Gy
Session Number: 25

## 2022-12-16 ENCOUNTER — Other Ambulatory Visit: Payer: Self-pay

## 2022-12-16 ENCOUNTER — Ambulatory Visit
Admission: RE | Admit: 2022-12-16 | Discharge: 2022-12-16 | Disposition: A | Discharge status: Home / Self Care | Payer: 59 | Source: Ambulatory Visit | Attending: Radiation Oncology | Admitting: Radiation Oncology

## 2022-12-16 DIAGNOSIS — Z95828 Presence of other vascular implants and grafts: Secondary | ICD-10-CM | POA: Diagnosis not present

## 2022-12-16 DIAGNOSIS — C20 Malignant neoplasm of rectum: Secondary | ICD-10-CM | POA: Diagnosis not present

## 2022-12-16 DIAGNOSIS — Z510 Encounter for antineoplastic radiation therapy: Secondary | ICD-10-CM | POA: Diagnosis not present

## 2022-12-16 LAB — RAD ONC ARIA SESSION SUMMARY
Course Elapsed Days: 35
Plan Fractions Treated to Date: 1
Plan Prescribed Dose Per Fraction: 1.8 Gy
Plan Total Fractions Prescribed: 3
Plan Total Prescribed Dose: 5.4 Gy
Reference Point Dosage Given to Date: 1.8 Gy
Reference Point Session Dosage Given: 1.8 Gy
Session Number: 26

## 2022-12-17 ENCOUNTER — Ambulatory Visit
Admission: RE | Admit: 2022-12-17 | Discharge: 2022-12-17 | Disposition: A | Discharge status: Home / Self Care | Payer: 59 | Source: Ambulatory Visit | Attending: Radiation Oncology | Admitting: Radiation Oncology

## 2022-12-17 ENCOUNTER — Other Ambulatory Visit: Payer: Self-pay

## 2022-12-17 DIAGNOSIS — C20 Malignant neoplasm of rectum: Secondary | ICD-10-CM | POA: Diagnosis not present

## 2022-12-17 DIAGNOSIS — Z95828 Presence of other vascular implants and grafts: Secondary | ICD-10-CM | POA: Diagnosis not present

## 2022-12-17 LAB — RAD ONC ARIA SESSION SUMMARY
Course Elapsed Days: 36
Plan Fractions Treated to Date: 2
Plan Prescribed Dose Per Fraction: 1.8 Gy
Plan Total Fractions Prescribed: 3
Plan Total Prescribed Dose: 5.4 Gy
Reference Point Dosage Given to Date: 3.6 Gy
Reference Point Session Dosage Given: 1.8 Gy
Session Number: 27

## 2022-12-18 ENCOUNTER — Ambulatory Visit
Admission: RE | Admit: 2022-12-18 | Discharge: 2022-12-18 | Disposition: A | Discharge status: Home / Self Care | Payer: 59 | Source: Ambulatory Visit | Attending: Radiation Oncology | Admitting: Radiation Oncology

## 2022-12-18 ENCOUNTER — Other Ambulatory Visit: Payer: Self-pay

## 2022-12-18 ENCOUNTER — Encounter: Payer: Self-pay | Admitting: Radiation Oncology

## 2022-12-18 DIAGNOSIS — C20 Malignant neoplasm of rectum: Secondary | ICD-10-CM | POA: Diagnosis not present

## 2022-12-18 DIAGNOSIS — Z95828 Presence of other vascular implants and grafts: Secondary | ICD-10-CM | POA: Diagnosis not present

## 2022-12-18 DIAGNOSIS — Z510 Encounter for antineoplastic radiation therapy: Secondary | ICD-10-CM | POA: Diagnosis not present

## 2022-12-18 LAB — RAD ONC ARIA SESSION SUMMARY
Course Elapsed Days: 37
Plan Fractions Treated to Date: 3
Plan Prescribed Dose Per Fraction: 1.8 Gy
Plan Total Fractions Prescribed: 3
Plan Total Prescribed Dose: 5.4 Gy
Reference Point Dosage Given to Date: 5.4 Gy
Reference Point Session Dosage Given: 1.8 Gy
Session Number: 28

## 2022-12-21 DIAGNOSIS — C20 Malignant neoplasm of rectum: Secondary | ICD-10-CM | POA: Diagnosis not present

## 2022-12-23 ENCOUNTER — Other Ambulatory Visit: Payer: No Typology Code available for payment source

## 2022-12-23 ENCOUNTER — Ambulatory Visit: Payer: No Typology Code available for payment source | Admitting: Hematology

## 2022-12-28 DIAGNOSIS — H401331 Pigmentary glaucoma, bilateral, mild stage: Secondary | ICD-10-CM | POA: Diagnosis not present

## 2023-01-04 ENCOUNTER — Telehealth: Payer: Self-pay

## 2023-01-04 NOTE — Telephone Encounter (Signed)
Returning patients MyChart message concerning right arm/hand numbness.    Patient stated the numbness and tingling in his right arm and hand has worsened since his last visit.  He also stated the pain is worse when he "stretches" out his arm and the right index finger and thumb are completely numb.  He has tried Tylenol and icy hot with no relief.  Denied chest pain and SOB and any other complaints. The left arm and hand are not as bad as the right per the patient.  Instructed the patient Dr. Ellin Saba would be notified.  Patient verbalized understanding.

## 2023-01-04 NOTE — Telephone Encounter (Signed)
Please advise 

## 2023-01-05 NOTE — Telephone Encounter (Signed)
Patient stated when he stretches out his arm he does have the pins and needle sensation with some burning in the right arm.

## 2023-01-06 ENCOUNTER — Other Ambulatory Visit: Payer: Self-pay

## 2023-01-06 DIAGNOSIS — C20 Malignant neoplasm of rectum: Secondary | ICD-10-CM

## 2023-01-06 DIAGNOSIS — T451X5A Adverse effect of antineoplastic and immunosuppressive drugs, initial encounter: Secondary | ICD-10-CM

## 2023-01-06 MED ORDER — GABAPENTIN 300 MG PO CAPS
300.0000 mg | ORAL_CAPSULE | Freq: Three times a day (TID) | ORAL | 1 refills | Status: DC
Start: 2023-01-06 — End: 2023-03-29

## 2023-01-06 NOTE — Telephone Encounter (Signed)
Spoke with the patient who stated the pins and needles are constant.  Prescription sent to pharmacy per Dr. Marice Potter answer by Shirleen Schirmer for 01/06/2023.

## 2023-01-12 ENCOUNTER — Encounter: Payer: Self-pay | Admitting: Hematology

## 2023-01-12 NOTE — Progress Notes (Signed)
  Radiation Oncology         [phone]) [phone] ________________________________  Name: Troy Dillon MRN: [id number]  Date: 12/18/2022  DOB: February 17, 1971  End of Treatment Note  Diagnosis:          Stage IIIB, cT3N1M0, adenocarcinoma of the proximal rectum   Indication for treatment:  Curative       Radiation treatment dates:   11/11/22-12/18/22  Site/dose:   The patient was treated with a course of IMRT using a simultaneous integrated boost technique. Daily image guidance was using during the treatment. The high dose region received a total of 50.4  Gy.  Narrative: The patient tolerated radiation treatment relatively well.   The patient experienced skin irritation toward the conclusion of therapy.  Plan: The patient will receive a call in about one month from the radiation oncology department. He will continue follow up with Dr. Ellin Saba as well as Dr. Cliffton Asters in colorectal surgery.      Osker Mason, PAC

## 2023-01-15 ENCOUNTER — Inpatient Hospital Stay: Payer: 59 | Attending: Hematology

## 2023-01-15 ENCOUNTER — Ambulatory Visit (HOSPITAL_COMMUNITY)
Admission: RE | Admit: 2023-01-15 | Discharge: 2023-01-15 | Disposition: A | Discharge status: Home / Self Care | Payer: 59 | Source: Ambulatory Visit | Attending: Hematology | Admitting: Hematology

## 2023-01-15 VITALS — BP 132/78 | HR 76 | Temp 97.5°F | Resp 18

## 2023-01-15 DIAGNOSIS — Z95828 Presence of other vascular implants and grafts: Secondary | ICD-10-CM

## 2023-01-15 DIAGNOSIS — G629 Polyneuropathy, unspecified: Secondary | ICD-10-CM | POA: Diagnosis not present

## 2023-01-15 DIAGNOSIS — R590 Localized enlarged lymph nodes: Secondary | ICD-10-CM | POA: Diagnosis not present

## 2023-01-15 DIAGNOSIS — C20 Malignant neoplasm of rectum: Secondary | ICD-10-CM | POA: Insufficient documentation

## 2023-01-15 DIAGNOSIS — Z8049 Family history of malignant neoplasm of other genital organs: Secondary | ICD-10-CM | POA: Insufficient documentation

## 2023-01-15 DIAGNOSIS — Z800 Family history of malignant neoplasm of digestive organs: Secondary | ICD-10-CM | POA: Insufficient documentation

## 2023-01-15 DIAGNOSIS — R911 Solitary pulmonary nodule: Secondary | ICD-10-CM | POA: Diagnosis not present

## 2023-01-15 LAB — COMPREHENSIVE METABOLIC PANEL
ALT: 19 U/L (ref 0–44)
AST: 24 U/L (ref 15–41)
Albumin: 3.7 g/dL (ref 3.5–5.0)
Alkaline Phosphatase: 114 U/L (ref 38–126)
Anion gap: 9 (ref 5–15)
BUN: 17 mg/dL (ref 6–20)
CO2: 24 mmol/L (ref 22–32)
Calcium: 8.6 mg/dL — ABNORMAL LOW (ref 8.9–10.3)
Chloride: 103 mmol/L (ref 98–111)
Creatinine, Ser: 1.05 mg/dL (ref 0.61–1.24)
GFR, Estimated: >60 mL/min (ref >60)
Glucose, Bld: 144 mg/dL — ABNORMAL HIGH (ref 70–99)
Potassium: 3.4 mmol/L — ABNORMAL LOW (ref 3.5–5.1)
Sodium: 136 mmol/L (ref 135–145)
Total Bilirubin: 1.6 mg/dL — ABNORMAL HIGH (ref 0.3–1.2)
Total Protein: 7.2 g/dL (ref 6.5–8.1)

## 2023-01-15 LAB — CBC WITH DIFFERENTIAL/PLATELET
Abs Immature Granulocytes: 0.01 10*3/uL (ref 0.00–0.07)
Basophils Absolute: 0.1 10*3/uL (ref 0.0–0.1)
Basophils Relative: 2 %
Eosinophils Absolute: 0.3 10*3/uL (ref 0.0–0.5)
Eosinophils Relative: 9 %
HCT: 39 % (ref 39.0–52.0)
Hemoglobin: 14.2 g/dL (ref 13.0–17.0)
Immature Granulocytes: 0 %
Lymphocytes Relative: 11 %
Lymphs Abs: 0.4 10*3/uL — ABNORMAL LOW (ref 0.7–4.0)
MCH: 34.5 pg — ABNORMAL HIGH (ref 26.0–34.0)
MCHC: 36.4 g/dL — ABNORMAL HIGH (ref 30.0–36.0)
MCV: 94.7 fL (ref 80.0–100.0)
Monocytes Absolute: 0.4 10*3/uL (ref 0.1–1.0)
Monocytes Relative: 9 %
Neutro Abs: 2.7 10*3/uL (ref 1.7–7.7)
Neutrophils Relative %: 69 %
Platelets: 152 10*3/uL (ref 150–400)
RBC: 4.12 MIL/uL — ABNORMAL LOW (ref 4.22–5.81)
RDW: 13.8 % (ref 11.5–15.5)
WBC: 3.9 10*3/uL — ABNORMAL LOW (ref 4.0–10.5)
nRBC: 0 % (ref 0.0–0.2)

## 2023-01-15 LAB — MAGNESIUM: Magnesium: 1.9 mg/dL (ref 1.7–2.4)

## 2023-01-15 MED ORDER — IOHEXOL 9 MG/ML PO SOLN
ORAL | Status: AC
  Filled 2023-01-15: qty 1000

## 2023-01-15 MED ORDER — HEPARIN SOD (PORK) LOCK FLUSH 100 UNIT/ML IV SOLN
500.0000 [IU] | Freq: Once | INTRAVENOUS | Status: AC
  Administered 2023-01-15: 500 [IU] via INTRAVENOUS

## 2023-01-15 MED ORDER — HEPARIN SOD (PORK) LOCK FLUSH 100 UNIT/ML IV SOLN
INTRAVENOUS | Status: AC
  Filled 2023-01-15: qty 5

## 2023-01-15 MED ORDER — IOHEXOL 300 MG/ML  SOLN
100.0000 mL | Freq: Once | INTRAMUSCULAR | Status: AC | PRN
  Administered 2023-01-15: 100 mL via INTRAVENOUS

## 2023-01-15 MED ORDER — SODIUM CHLORIDE 0.9% FLUSH
10.0000 mL | INTRAVENOUS | Status: DC | PRN
  Administered 2023-01-15: 10 mL via INTRAVENOUS

## 2023-01-15 NOTE — Patient Instructions (Signed)
[facility]  Discharge Instructions: Thank you for choosing  Cancer Center to provide your oncology and hematology care.  If you have a lab appointment with the Cancer Center - please note that after April 8th, 2024, all labs will be drawn in the cancer center.  You do not have to check in or register with the main entrance as you have in the past but will complete your check-in in the cancer center.  Wear comfortable clothing and clothing appropriate for easy access to any Portacath or PICC line.   We strive to give you quality time with your provider. You may need to reschedule your appointment if you arrive late (15 or more minutes).  Arriving late affects you and other patients whose appointments are after yours.  Also, if you miss three or more appointments without notifying the office, you may be dismissed from the clinic at the provider's discretion.      For prescription refill requests, have your pharmacy contact our office and allow 72 hours for refills to be completed.  To help prevent nausea and vomiting after your treatment, we encourage you to take your nausea medication as directed.  BELOW ARE SYMPTOMS THAT SHOULD BE REPORTED IMMEDIATELY: *FEVER GREATER THAN 100.4 F (38 C) OR HIGHER *CHILLS OR SWEATING *NAUSEA AND VOMITING THAT IS NOT CONTROLLED WITH YOUR NAUSEA MEDICATION *UNUSUAL SHORTNESS OF BREATH *UNUSUAL BRUISING OR BLEEDING *URINARY PROBLEMS (pain or burning when urinating, or frequent urination) *BOWEL PROBLEMS (unusual diarrhea, constipation, pain near the anus) TENDERNESS IN MOUTH AND THROAT WITH OR WITHOUT PRESENCE OF ULCERS (sore throat, sores in mouth, or a toothache) UNUSUAL RASH, SWELLING OR PAIN  UNUSUAL VAGINAL DISCHARGE OR ITCHING   Items with * indicate a potential emergency and should be followed up as soon as possible or go to the Emergency Department if any problems should occur.  Please show the CHEMOTHERAPY ALERT CARD or  IMMUNOTHERAPY ALERT CARD at check-in to the Emergency Department and triage nurse.  Should you have questions after your visit or need to cancel or reschedule your appointment, please contact [facility] [phone]  and follow the prompts.  Office hours are 8:00 a.m. to 4:30 p.m. Monday - Friday. Please note that voicemails left after 4:00 p.m. may not be returned until the following business day.  We are closed weekends and major holidays. You have access to a nurse at all times for urgent questions. Please call the main number to the clinic [phone] and follow the prompts.  For any non-urgent questions, you may also contact your provider using MyChart. We now offer e-Visits for anyone 18 and older to request care online for non-urgent symptoms. For details visit [email or web address].   Also download the MyChart app! Go to the app store, search "MyChart", open the app, select , and log in with your MyChart username and password.   

## 2023-01-15 NOTE — Progress Notes (Signed)
Patients port flushed without difficulty.  Good blood return noted with no bruising or swelling noted at site.  Patient remains accessed for CT scan.  °

## 2023-01-17 LAB — CEA: CEA: 2.5 ng/mL (ref 0.0–4.7)

## 2023-01-19 NOTE — Progress Notes (Signed)
[facility] [address], [place] [address]    Clinic Day:  01/20/2023  Referring physician: [facility]  Patient Care Team: [facility] as PCP - General [name], MD as PCP - Cardiology (Cardiology) Doreatha Massed, MD as Medical Oncologist (Medical Oncology) Therese Sarah, RN as Oncology Nurse Navigator (Medical Oncology)   ASSESSMENT & PLAN:   Assessment: 1.  Stage IIIB (T3cN1) rectal adenocarcinoma, MSI stable: - Presentation with rectal bleeding - Colonoscopy (06/08/2022): Fungating partially obstructing large mass found in the rectum 11 cm from anal verge.  Partially circumferential involving one half of the lumen.  Mass measured 3 cm in length.  Diameter 2 cm.  Oozing present.  1 semipedunculated polyp (40 cm from anus) and 3 sessile polyps in the cecum removed. - Pathology (06/08/2022): Rectal mass 11 cm from anal verge-adenocarcinoma.  MMR preserved.  MSI-stable. - CEA (06/08/2022): 2.9 - MRI pelvis (06/10/2022): T3CN1, tumor extends into sigmoid from the high rectum, extension through muscularis propria, approximately 6 mm beyond the rectal wall.  Tumor begins in the highly portion of the rectum and extends into the sigmoid colon.  Extramural vascular invasion/tumor thrombus along the right lateral margin.  Shortest distance of tumor from mesorectal fascia: 15 mm.  Single high mesorectal or superior rectal lymph node measuring 9 mm.  Distance from tumor to internal anal sphincter is approximately 7-11 cm. - CT CAP (06/10/2022): Circumferential wall thickening of the upper/mid rectum, mildly enlarged high perirectal lymph nodes measuring up to 6 mm in short axis.  No evidence of distant metastatic disease in the chest, abdomen or pelvis. - Neoadjuvant FOLFOX 8 cycles from 06/29/2022 through 10/05/2022 - MRI pelvis (10/13/2022): Evidence of decreased extramural vascular invasion along the right lateral margin.  Tumor  length 3.4 cm, previously 4.5 cm.  Significant interval decrease thickening of high rectum/low sigmoid colon.  Mesorectal lymph nodes 7 mm, previously 9 mm in 4 mm node.  I discussed with radiologist who felt that there is more than 20% clinical improvement. - I have recommended foregoing radiation based on Prospect trial data.  He met with Dr. Cliffton Asters and Dr. Mitzi Hansen who have recommended chemoradiation. - Chemoradiation with Xeloda from 11/11/2022 through 12/18/2022.   2.  Social/family history: - He lives at home with his wife [name].  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  Denies any chemical exposure.  Does not smoke cigarettes but dips tobacco. - Father had colon cancer at age 73. - Paternal grandmother died of colon cancer. - Paternal aunt had colon cancer. - Mother had "male cancer" and underwent hysterectomy.    Plan: 1.  Stage IIIb (T3CN1) rectal adenocarcinoma, MSI-stable: - Reviewed CT CAP from 01/15/2023: Decrease in rectal wall thickening and size of mesenteric/mesorectal lymph nodes.  No evidence of metastatic disease. - CEA was 2.5.  LFTs are normal. - He has met with Dr. Cliffton Asters.  Surgery scheduled on 02/17/2023. - He wants to have port removed as he cannot lift weights at his work.  We will consider it  after surgery.  RTC first week of August.   2.  Peripheral neuropathy: -He had pain in the right forearm.  He was started on gabapentin 300 mg 3 times daily on 01/06/2023.  Pain has improved.  However he has some pins and needle sensation, constant in the right hand index finger and thumb.  Will closely monitor.     No orders of the defined types were placed  in this encounter.     I,Katie Daubenspeck,acting as a Neurosurgeon for Doreatha Massed, MD.,have documented all relevant documentation on the behalf of Doreatha Massed, MD,as directed by  Doreatha Massed, MD while in the presence of Doreatha Massed, MD.   I, Doreatha Massed MD, have reviewed the above  documentation for accuracy and completeness, and I agree with the above.   Doreatha Massed, MD   5/29/20245:37 PM  CHIEF COMPLAINT:   Diagnosis: stage III rectal cancer     Cancer Staging  Rectal carcinoma [facility]) Staging form: Colon and Rectum, AJCC 8th Edition - Clinical stage from 06/18/2022: Stage IIIB (cT3, cN1, cM0) - Unsigned    Prior Therapy: 1. FOLFOX 06/29/22 - 10/05/22 2. Xeloda, radiation 11/11/22 - 12/18/22  Current Therapy: Surgery on 01/28/2023   HISTORY OF PRESENT ILLNESS:   Oncology History  Rectal carcinoma (HCC)  06/17/2022 Initial Diagnosis   Rectal carcinoma (HCC)   06/29/2022 -  Chemotherapy   Patient is on Treatment Plan : COLORECTAL FOLFOX q14d x 4 months      Genetic Testing   [facility] Common Cancer Panel+RNA was Negative. Report date is 08/29/2022.  The Common Hereditary Cancers Panel offered by [facility] includes sequencing and/or deletion duplication testing of the following 48 genes: APC, ATM, AXIN2, BAP1, BARD1, BMPR1A, BRCA1, BRCA2, BRIP1, CDH1, CDK4, CDKN2A (p14ARF and p16INK4a only), CHEK2, CTNNA1, DICER1, EPCAM (Deletion/duplication testing only), FH, GREM1 (promoter region duplication testing only), HOXB13, KIT, MBD4, MEN1, MLH1, MSH2, MSH3, MSH6, MUTYH, NF1, NHTL1, PALB2, PDGFRA, PMS2, POLD1, POLE, PTEN, RAD51C, RAD51D, SDHA (sequencing analysis only except exon 14), SDHB, SDHC, SDHD, SMAD4, SMARCA4. STK11, TP53, TSC1, TSC2, and VHL.      INTERVAL HISTORY:   Troy Dillon is a 52 y.o. male presenting to clinic today for follow up of stage III rectal cancer. He was last seen by me on 12/14/22.  Since his last visit, he underwent restaging CT C/A/P on 01/15/23 showing: significant interval decrease in rectal wall thickening; decreased size of lymphadenopathy; new clusters of micro nodularity in anterior RUL and 2-3 mm RLL pulmonary nodules, favored infectious/inflammatory; no definitive evidence of distant metastatic disease.  Today, he states that he is  doing well overall. His appetite level is at 100%. His energy level is at 100%.  PAST MEDICAL HISTORY:   Past Medical History: Past Medical History:  Diagnosis Date   Rectal cancer (HCC) 06/08/2022    Surgical History: Past Surgical History:  Procedure Laterality Date   COLONOSCOPY  06/08/2022   EYE SURGERY     FRACTURE SURGERY     Right Femur- Cables in bone remain in place, rod removed in 2000   IR IMAGING GUIDED PORT INSERTION  06/22/2022   ORTHOPEDIC SURGERY      Social History: Social History   Socioeconomic History   Marital status: Married    Spouse name: Not on file   Number of children: Not on file   Years of education: Not on file   Highest education level: Not on file  Occupational History   Not on file  Tobacco Use   Smoking status: Former    Types: Cigarettes    Quit date: 2003    Years since quitting: 21.4   Smokeless tobacco: Current    Types: Snuff   Tobacco comments:    Some Dipping  Vaping Use   Vaping Use: Never used  Substance and Sexual Activity   Alcohol use: Yes    Comment: occasionally   Drug use: Never   Sexual  activity: Not on file  Other Topics Concern   Not on file  Social History Narrative   Not on file   Social Determinants of Health   Financial Resource Strain: Not on file  Food Insecurity: No Food Insecurity (10/28/2022)   Hunger Vital Sign    Worried About Running Out of Food in the Last Year: Never true    Ran Out of Food in the Last Year: Never true  Transportation Needs: Not on file  Physical Activity: Not on file  Stress: Not on file  Social Connections: Not on file  Intimate Partner Violence: Not At Risk (10/28/2022)   Humiliation, Afraid, Rape, and Kick questionnaire    Fear of Current or Ex-Partner: No    Emotionally Abused: No    Physically Abused: No    Sexually Abused: No    Family History: Family History  Problem Relation Age of Onset   Cancer Mother 3 - 53       GYN malignancy details unknown,  TAH/BSO   Colon cancer Father 22   Heart attack Father    Colon cancer Paternal Aunt 34   Colon cancer Paternal Grandmother 3    Current Medications:  Current Outpatient Medications:    capecitabine (XELODA) 500 MG tablet, Take 3 tablets (1,500 mg total) by mouth 2 (two) times daily after a meal. Take Monday-Friday. Take only on days of radiation., Disp: 168 tablet, Rfl: 0   dextrose 5 % SOLN 1,000 mL with fluorouracil 5 GM/100ML SOLN, Inject into the vein over 48 hr. Every 14 days, Disp: , Rfl:    escitalopram (LEXAPRO) 10 MG tablet, Take 1 tablet (10 mg total) by mouth daily., Disp: 30 tablet, Rfl: 3   fluconazole (DIFLUCAN) 100 MG tablet, Take 100 mg by mouth daily., Disp: , Rfl:    FLUOROURACIL IV, Inject into the vein every 14 (fourteen) days., Disp: , Rfl:    gabapentin (NEURONTIN) 300 MG capsule, Take 1 capsule (300 mg total) by mouth 3 (three) times daily., Disp: 90 capsule, Rfl: 1   ibuprofen (ADVIL) 200 MG tablet, Take 200 mg by mouth every 6 (six) hours as needed for moderate pain., Disp: , Rfl:    LEUCOVORIN CALCIUM IV, Inject into the vein every 14 (fourteen) days., Disp: , Rfl:    lidocaine-prilocaine (EMLA) cream, Apply a small amount to port a cath site and cover with plastic wrap one hour prior to infusion appointments, Disp: 30 g, Rfl: 3   megestrol (MEGACE) 400 MG/10ML suspension, Take 10 mLs (400 mg total) by mouth 2 (two) times daily., Disp: 480 mL, Rfl: 3   Multiple Vitamin (MULTIVITAMIN) tablet, Take 1 tablet by mouth daily., Disp: , Rfl:    ondansetron (ZOFRAN) 8 MG tablet, Take 1 tablet (8 mg total) by mouth every 8 (eight) hours as needed., Disp: 60 tablet, Rfl: 2   OXALIPLATIN IV, Inject into the vein every 14 (fourteen) days., Disp: , Rfl:    prochlorperazine (COMPAZINE) 10 MG tablet, Take 1 tablet (10 mg total) by mouth every 6 (six) hours as needed for nausea or vomiting., Disp: 30 tablet, Rfl: 1   promethazine (PHENERGAN) 25 MG suppository, Place 1 suppository  (25 mg total) rectally every 6 (six) hours as needed for nausea or vomiting., Disp: 120 each, Rfl: 2   Salicylic Acid, Acne, (SALICYLIC ACID EX), Apply 1 Application topically daily as needed (acne)., Disp: , Rfl:    sodium chloride (OCEAN) 0.65 % SOLN nasal spray, Place 1 spray into both nostrils  as needed for congestion., Disp: , Rfl:    Allergies: Allergies  Allergen Reactions   Hydrocodone Itching   Latex     Eye irritation     REVIEW OF SYSTEMS:   Review of Systems  Constitutional:  Negative for chills, fatigue and fever.  HENT:   Negative for lump/mass, mouth sores, nosebleeds, sore throat and trouble swallowing.   Eyes:  Negative for eye problems.  Respiratory:  Negative for cough and shortness of breath.   Cardiovascular:  Negative for chest pain, leg swelling and palpitations.  Gastrointestinal:  Negative for abdominal pain, constipation, diarrhea, nausea and vomiting.  Genitourinary:  Negative for bladder incontinence, difficulty urinating, dysuria, frequency, hematuria and nocturia.   Musculoskeletal:  Negative for arthralgias, back pain, flank pain, myalgias and neck pain.  Skin:  Negative for itching and rash.  Neurological:  Positive for numbness. Negative for dizziness and headaches.  Hematological:  Does not bruise/bleed easily.  Psychiatric/Behavioral:  Negative for depression, sleep disturbance and suicidal ideas. The patient is not nervous/anxious.   All other systems reviewed and are negative.    VITALS:   Blood pressure 119/81, pulse 68, temperature 98.3 F (36.8 C), temperature source Oral, resp. rate 18, weight 186 lb 11.2 oz (84.7 kg), SpO2 98 %.  Wt Readings from Last 3 Encounters:  01/20/23 186 lb 11.2 oz (84.7 kg)  12/01/22 181 lb 4.8 oz (82.2 kg)  11/24/22 182 lb 6.4 oz (82.7 kg)    Body mass index is 26.79 kg/m.  Performance status (ECOG): 0 - Asymptomatic  PHYSICAL EXAM:   Physical Exam Vitals and nursing note reviewed. Exam conducted with  a chaperone present.  Constitutional:      Appearance: Normal appearance.  Cardiovascular:     Rate and Rhythm: Normal rate and regular rhythm.     Pulses: Normal pulses.     Heart sounds: Normal heart sounds.  Pulmonary:     Effort: Pulmonary effort is normal.     Breath sounds: Normal breath sounds.  Abdominal:     Palpations: Abdomen is soft. There is no hepatomegaly, splenomegaly or mass.     Tenderness: There is no abdominal tenderness.  Musculoskeletal:     Right lower leg: No edema.     Left lower leg: No edema.  Lymphadenopathy:     Cervical: No cervical adenopathy.     Right cervical: No superficial, deep or posterior cervical adenopathy.    Left cervical: No superficial, deep or posterior cervical adenopathy.     Upper Body:     Right upper body: No supraclavicular or axillary adenopathy.     Left upper body: No supraclavicular or axillary adenopathy.  Neurological:     General: No focal deficit present.     Mental Status: He is alert and oriented to person, place, and time.  Psychiatric:        Mood and Affect: Mood normal.        Behavior: Behavior normal.     LABS:      Latest Ref Rng & Units 01/15/2023   11:40 AM 12/14/2022   10:26 AM 12/08/2022    1:31 PM  CBC  WBC 4.0 - 10.5 K/uL 3.9  3.7  3.8   Hemoglobin 13.0 - 17.0 g/dL 45.4  09.8  11.9   Hematocrit 39.0 - 52.0 % 39.0  37.6  37.2   Platelets 150 - 400 K/uL 152  164  147       Latest Ref Rng & Units 01/15/2023  11:40 AM 12/14/2022   10:26 AM 12/08/2022    1:31 PM  CMP  Glucose 70 - 99 mg/dL 161  096  045   BUN 6 - 20 mg/dL 17  13  14    Creatinine 0.61 - 1.24 mg/dL 4.09  8.11  9.14   Sodium 135 - 145 mmol/L 136  133  136   Potassium 3.5 - 5.1 mmol/L 3.4  3.7  3.3   Chloride 98 - 111 mmol/L 103  102  106   CO2 22 - 32 mmol/L 24  23  22    Calcium 8.9 - 10.3 mg/dL 8.6  8.8  8.6   Total Protein 6.5 - 8.1 g/dL 7.2  7.2  7.4   Total Bilirubin 0.3 - 1.2 mg/dL 1.6  1.4  1.3   Alkaline Phos 38 - 126 U/L  114  109  114   AST 15 - 41 U/L 24  23  28    ALT 0 - 44 U/L 19  19  22       Lab Results  Component Value Date   CEA1 2.5 01/15/2023   /  CEA  Date Value Ref Range Status  01/15/2023 2.5 0.0 - 4.7 ng/mL Final    Comment:    (NOTE)                             Nonsmokers          <3.9                             Smokers             <5.6 [employer] Electrochemiluminescence Immunoassay (ECLIA) Values obtained with different assay methods or kits cannot be used interchangeably.  Results cannot be interpreted as absolute evidence of the presence or absence of malignant disease. Performed At: [facility] [address] [place], [place] [id number] Jolene Schimke MD YQ:[id number]    No results found for: "PSA1" No results found for: "[id number]" No results found for: "CAN125"  No results found for: "TOTALPROTELP", "ALBUMINELP", "A1GS", "A2GS", "BETS", "BETA2SER", "GAMS", "MSPIKE", "SPEI" No results found for: "TIBC", "FERRITIN", "IRONPCTSAT" No results found for: "LDH"   STUDIES:   CT CHEST ABDOMEN PELVIS W CONTRAST  Result Date: 01/15/2023 CLINICAL DATA:  History of rectal cancer, monitor. Status post chemotherapy and radiation. * Tracking Code: BO * EXAM: CT CHEST, ABDOMEN, AND PELVIS WITH CONTRAST TECHNIQUE: Multidetector CT imaging of the chest, abdomen and pelvis was performed following the standard protocol during bolus administration of intravenous contrast. RADIATION DOSE REDUCTION: This exam was performed according to the departmental dose-optimization program which includes automated exposure control, adjustment of the mA and/or kV according to patient size and/or use of iterative reconstruction technique. CONTRAST:  OMNIPAQUE IOHEXOL 300 MG/ML  SOLN COMPARISON:  Multiple priors including CT June 10, 2022 and MRI October 13, 2022 FINDINGS: CT CHEST FINDINGS Cardiovascular: Right chest Port-A-Cath with tip near the superior cavoatrial junction. Normal  caliber thoracic aorta. No central pulmonary embolus on this nondedicated study. Normal size heart. No significant pericardial effusion/thickening. Coronary artery calcifications. Mediastinum/Nodes: No suspicious thyroid nodule. No pathologically enlarged mediastinal, hilar or axillary lymph nodes. The esophagus is grossly unremarkable. Lungs/Pleura: Clusters of micro nodularity in the anterior right upper lobe for instance on image 46/3. New 2-3 mm right lower lobe pulmonary nodule on image 120/3. No pleural effusion. No pneumothorax. Biapical  pleuroparenchymal scarring. Musculoskeletal: No aggressive lytic or blastic lesion of bone. Asymmetric right gynecomastia. CT ABDOMEN PELVIS FINDINGS Hepatobiliary: No suspicious hepatic lesion. Gallbladder is nondistended. No biliary ductal dilation. Pancreas: No pancreatic ductal dilation or evidence of acute inflammation. Spleen: No splenomegaly. Adrenals/Urinary Tract: Bilateral adrenal glands appear normal. No hydronephrosis. Kidneys demonstrate symmetric enhancement and excretion of contrast material. Urinary bladder is unremarkable for degree of distension. Stomach/Bowel: Radiopaque enteric contrast material traverses the ascending colon. Stomach is distended with ingested material and gas without focal wall thickening. Small hiatal hernia. No pathologic dilation of small or large bowel. Significant interval decrease in the concentric high rectal/low sigmoid wall thickening for instance on image 107/2. Vascular/Lymphatic: Decreased size of the mesorectal/sigmoid mesentery lymph nodes now with a hazy soft tissue in the region of the larger lymph node which measures 3 mm in short axis on image 103/2 previously 6 mm. Smaller adjacent lymph node now measures 1-2 mm in short axis on image 103/2 previously 3 mm. No new pathologically enlarged or enlarging lymph nodes identified. Reproductive: Prostate is unremarkable. Other: No significant abdominopelvic free fluid.  Musculoskeletal: L1 limbus type vertebral body. No aggressive lytic or blastic lesion of bone. Surgical fixation tracts in the right femur. Cerclage wire about the proximal right femoral diaphysis. IMPRESSION: 1. Significant interval decrease in the concentric high rectal/low sigmoid wall thickening. 2. Decreased size of the mesorectal/sigmoid mesentery lymph nodes. 3. Clusters of micro nodularity in the anterior right upper lobe and new 2-3 mm right lower lobe pulmonary nodule, favored to be infectious/inflammatory in etiology. Suggest short-term interval follow-up dedicated chest CT to ensure resolution. 4. No definitive evidence of distant metastatic disease in the chest, abdomen or pelvis. Electronically Signed   By: Maudry Mayhew M.D.   On: 01/15/2023 16:10

## 2023-01-20 ENCOUNTER — Encounter (HOSPITAL_COMMUNITY): Payer: Self-pay

## 2023-01-20 ENCOUNTER — Encounter: Payer: Self-pay | Admitting: Hematology

## 2023-01-20 ENCOUNTER — Ambulatory Visit: Payer: Self-pay | Admitting: Surgery

## 2023-01-20 ENCOUNTER — Inpatient Hospital Stay (HOSPITAL_BASED_OUTPATIENT_CLINIC_OR_DEPARTMENT_OTHER): Payer: 59 | Admitting: Hematology

## 2023-01-20 VITALS — BP 119/81 | HR 68 | Temp 98.3°F | Resp 18 | Wt 186.7 lb

## 2023-01-20 DIAGNOSIS — G629 Polyneuropathy, unspecified: Secondary | ICD-10-CM | POA: Diagnosis not present

## 2023-01-20 DIAGNOSIS — C20 Malignant neoplasm of rectum: Secondary | ICD-10-CM | POA: Diagnosis not present

## 2023-01-20 DIAGNOSIS — R739 Hyperglycemia, unspecified: Secondary | ICD-10-CM

## 2023-01-20 DIAGNOSIS — Z8049 Family history of malignant neoplasm of other genital organs: Secondary | ICD-10-CM | POA: Diagnosis not present

## 2023-01-20 DIAGNOSIS — Z800 Family history of malignant neoplasm of digestive organs: Secondary | ICD-10-CM | POA: Diagnosis not present

## 2023-01-20 DIAGNOSIS — Z01818 Encounter for other preprocedural examination: Secondary | ICD-10-CM

## 2023-01-20 NOTE — Patient Instructions (Signed)
[facility]  Discharge Instructions  You were seen and examined today by Dr. Ellin Saba.  Dr. Ellin Saba discussed your most recent lab work which revealed everything looks stable.  Follow-up as scheduled the 1st week of August to discuss results and plan at that time.    Thank you for choosing [facility] to provide your oncology and hematology care.   To afford each patient quality time with our provider, please arrive at least 15 minutes before your scheduled appointment time. You may need to reschedule your appointment if you arrive late (10 or more minutes). Arriving late affects you and other patients whose appointments are after yours.  Also, if you miss three or more appointments without notifying the office, you may be dismissed from the clinic at the provider's discretion.    Again, thank you for choosing [facility].  Our hope is that these requests will decrease the amount of time that you wait before being seen by our physicians.   If you have a lab appointment with the Cancer Center - please note that after April 8th, all labs will be drawn in the cancer center.  You do not have to check in or register with the main entrance as you have in the past but will complete your check-in at the cancer center.            _____________________________________________________________  Should you have questions after your visit to [facility], please contact our office at [phone] and follow the prompts.  Our office hours are 8:00 a.m. to 4:30 p.m. Monday - Thursday and 8:00 a.m. to 2:30 p.m. Friday.  Please note that voicemails left after 4:00 p.m. may not be returned until the following business day.  We are closed weekends and all major holidays.  You do have access to a nurse 24-7, just call the main number to the clinic [phone] and do not press any options, hold on the line and a nurse will answer  the phone.    For prescription refill requests, have your pharmacy contact our office and allow 72 hours.    Masks are no longer required in the cancer centers. If you would like for your care team to wear a mask while they are taking care of you, please let them know. You may have one support person who is at least 52 years old accompany you for your appointments.

## 2023-01-21 ENCOUNTER — Other Ambulatory Visit: Payer: Self-pay

## 2023-02-02 ENCOUNTER — Encounter (HOSPITAL_COMMUNITY): Payer: No Typology Code available for payment source

## 2023-02-03 NOTE — Progress Notes (Addendum)
COVID Vaccine Completed:  Yes  Date of COVID positive in last 90 days:  No  PCP - [facility] Cardiologist - Carolan Clines, MD  Chest x-ray - 09-17-22 Epic EKG - 09-18-22 Epic Stress Test - N/A ECHO - N/A Cardiac Cath - N/A Pacemaker/ICD device last checked: Spinal Cord Stimulator:N/A  Bowel Prep - Yes  Sleep Study - N/A CPAP -   Fasting Blood Sugar - N/A Checks Blood Sugar _____ times a day  Last dose of GLP1 agonist-  N/A GLP1 instructions:  N/A   Last dose of SGLT-2 inhibitors-  N/A SGLT-2 instructions: N/A   Blood Thinner Instructions:  Time Aspirin Instructions: Last Dose:  Activity level:  Can go up a flight of stairs and perform activities of daily living without stopping and without symptoms of chest pain or shortness of breath.  Able to exercise without symptoms  Anesthesia review:  Dizziness and syncope evaluated by cardiology 2023, Pt reports no recent episodes of dizziness or syncope.   Rectal cancer, radiation and chemo completed.   Patient denies shortness of breath, fever, cough and chest pain at PAT appointment  Patient verbalized understanding of instructions that were given to them at the PAT appointment. Patient was also instructed that they will need to review over the PAT instructions again at home before surgery.

## 2023-02-03 NOTE — Patient Instructions (Addendum)
SURGICAL WAITING ROOM VISITATION Patients having surgery or a procedure may have no more than 2 support people in the waiting area - these visitors may rotate.    Children under the age of 1 must have an adult with them who is not the patient.  If the patient needs to stay at the hospital during part of their recovery, the visitor guidelines for inpatient rooms apply. Pre-op nurse will coordinate an appropriate time for 1 support person to accompany patient in pre-op.  This support person may not rotate.    Please refer to the [facility] website for the visitor guidelines for Inpatients (after your surgery is over and you are in a regular room).       Your procedure is scheduled on: 02-17-23   Report to [facility] Main Entrance    Report to admitting at 6:15 AM   Call this number if you have problems the morning of surgery [phone]   Do not eat food :After Midnight.   After Midnight you may have the following liquids until 5:30 AM DAY OF SURGERY  Water Non-Citrus Juices (without pulp, NO RED-Apple, White grape, White cranberry) Black Coffee (NO MILK/CREAM OR CREAMERS, sugar ok)  Clear Tea (NO MILK/CREAM OR CREAMERS, sugar ok) regular and decaf                             Plain Jell-O (NO RED)                                           Fruit ices (not with fruit pulp, NO RED)                                     Popsicles (NO RED)                                                               Sports drinks like Gatorade (NO RED)  Drink 2 Pre-Surgery Ensure the evening before surgery (complete by 10 PM)                   The day of surgery:  Drink ONE (1) Pre-Surgery Clear Ensure at 5:30 AM the morning of surgery. Drink in one sitting. Do not sip.  This drink was given to you during your hospital  pre-op appointment visit. Nothing else to drink after completing the Pre-Surgery Clear Ensure .          If you have questions, please contact your surgeon's  office.   FOLLOW BOWEL PREP AND ANY ADDITIONAL PRE OP INSTRUCTIONS YOU RECEIVED FROM YOUR SURGEON'S OFFICE!!! -Clear liquids day of prep to prevent dehydration  - Bisacodyl (Dulcolax) 20 mg - Give with water the day prior to surgery.   - Miralax 255g - Mix with 64 oz Gatorade/Powerade.  Drink gradually over the next few hours (8 oz glass every 15-30 minutes) until gone the day prior to surgery.   -Metronidazole (Flagyl) 1000 mg - At 2 pm, 3 pm and 10 pm after Miralax bowel prep the  day prior to surgery.       -Neomycin 1000 mg - At 2 pm, 3 pm and 10 pm after Miralax  bowel prep the day prior to surgery.      Oral Hygiene is also important to reduce your risk of infection.                                    Remember - BRUSH YOUR TEETH THE MORNING OF SURGERY WITH YOUR REGULAR TOOTHPASTE   Do NOT smoke after Midnight   Take these medicines the morning of surgery:   Escitalopram  Gabapentin  Ondansetron/Compazine if needed                              You may not have any metal on your body including jewelry, and body piercing             Do not wear lotions, powders, cologne, or deodorant              Men may shave face and neck.   Do not bring valuables to the hospital. [facility] IS NOT RESPONSIBLE   FOR VALUABLES.   Contacts, dentures or bridgework may not be worn into surgery.   Bring small overnight bag day of surgery.   DO NOT BRING YOUR HOME MEDICATIONS TO THE HOSPITAL. PHARMACY WILL DISPENSE MEDICATIONS LISTED ON YOUR MEDICATION LIST TO YOU DURING YOUR ADMISSION IN THE HOSPITAL!    Special Instructions: Bring a copy of your healthcare power of attorney and living will documents the day of surgery if you haven't scanned them before.              Please read over the following fact sheets you were given: IF YOU HAVE QUESTIONS ABOUT YOUR PRE-OP INSTRUCTIONS PLEASE CALL [phone] Gwen  If you received a COVID test during your pre-op visit  it is requested that you  wear a mask when out in public, stay away from anyone that may not be feeling well and notify your surgeon if you develop symptoms. If you test positive for Covid or have been in contact with anyone that has tested positive in the last 10 days please notify you surgeon.  [facility] - Preparing for Surgery Before surgery, you can play an important role.  Because skin is not sterile, your skin needs to be as free of germs as possible.  You can reduce the number of germs on your skin by washing with CHG (chlorahexidine gluconate) soap before surgery.  CHG is an antiseptic cleaner which kills germs and bonds with the skin to continue killing germs even after washing. Please DO NOT use if you have an allergy to CHG or antibacterial soaps.  If your skin becomes reddened/irritated stop using the CHG and inform your nurse when you arrive at Short Stay. Do not shave (including legs and underarms) for at least 48 hours prior to the first CHG shower.  You may shave your face/neck.  Please follow these instructions carefully:  1.  Shower with CHG Soap the night before surgery and the  morning of surgery.  2.  If you choose to wash your hair, wash your hair first as usual with your normal  shampoo.  3.  After you shampoo, rinse your hair and body thoroughly to remove the shampoo.  4.  Use CHG as you would any other liquid soap.  You can apply chg directly to the skin and wash.  Gently with a scrungie or clean washcloth.  5.  Apply the CHG Soap to your body ONLY FROM THE NECK DOWN.   Do   not use on face/ open                           Wound or open sores. Avoid contact with eyes, ears mouth and   genitals (private parts).                       Wash face,  Genitals (private parts) with your normal soap.             6.  Wash thoroughly, paying special attention to the area where your    surgery  will be performed.  7.  Thoroughly rinse your body with warm water from the neck down.  8.   DO NOT shower/wash with your normal soap after using and rinsing off the CHG Soap.                9.  Pat yourself dry with a clean towel.            10.  Wear clean pajamas.            11.  Place clean sheets on your bed the night of your first shower and do not  sleep with pets. Day of Surgery : Do not apply any lotions/deodorants the morning of surgery.  Please wear clean clothes to the hospital/surgery center.  FAILURE TO FOLLOW THESE INSTRUCTIONS MAY RESULT IN THE CANCELLATION OF YOUR SURGERY  PATIENT SIGNATURE_________________________________  NURSE SIGNATURE__________________________________  ________________________________________________________________________    Rogelia Mire  An incentive spirometer is a tool that can help keep your lungs clear and active. This tool measures how well you are filling your lungs with each breath. Taking long deep breaths may help reverse or decrease the chance of developing breathing (pulmonary) problems (especially infection) following: A long period of time when you are unable to move or be active. BEFORE THE PROCEDURE  If the spirometer includes an indicator to show your best effort, your nurse or respiratory therapist will set it to a desired goal. If possible, sit up straight or lean slightly forward. Try not to slouch. Hold the incentive spirometer in an upright position. INSTRUCTIONS FOR USE  Sit on the edge of your bed if possible, or sit up as far as you can in bed or on a chair. Hold the incentive spirometer in an upright position. Breathe out normally. Place the mouthpiece in your mouth and seal your lips tightly around it. Breathe in slowly and as deeply as possible, raising the piston or the ball toward the top of the column. Hold your breath for 3-5 seconds or for as long as possible. Allow the piston or ball to fall to the bottom of the column. Remove the mouthpiece from your mouth and breathe out normally. Rest for a  few seconds and repeat Steps 1 through 7 at least 10 times every 1-2 hours when you are awake. Take your time and take a few normal breaths between deep breaths. The spirometer may include an indicator to show your best effort. Use the indicator as a goal to work toward during each repetition. After each set of 10 deep breaths, practice coughing to be  sure your lungs are clear. If you have an incision (the cut made at the time of surgery), support your incision when coughing by placing a pillow or rolled up towels firmly against it. Once you are able to get out of bed, walk around indoors and cough well. You may stop using the incentive spirometer when instructed by your caregiver.  RISKS AND COMPLICATIONS Take your time so you do not get dizzy or light-headed. If you are in pain, you may need to take or ask for pain medication before doing incentive spirometry. It is harder to take a deep breath if you are having pain. AFTER USE Rest and breathe slowly and easily. It can be helpful to keep track of a log of your progress. Your caregiver can provide you with a simple table to help with this. If you are using the spirometer at home, follow these instructions: SEEK MEDICAL CARE IF:  You are having difficultly using the spirometer. You have trouble using the spirometer as often as instructed. Your pain medication is not giving enough relief while using the spirometer. You develop fever of 100.5 F (38.1 C) or higher. SEEK IMMEDIATE MEDICAL CARE IF:  You cough up bloody sputum that had not been present before. You develop fever of 102 F (38.9 C) or greater. You develop worsening pain at or near the incision site. MAKE SURE YOU:  Understand these instructions. Will watch your condition. Will get help right away if you are not doing well or get worse. Document Released: 12/21/2006 Document Revised: 11/02/2011 Document Reviewed: 02/21/2007 ExitCare Patient Information 2014 ExitCare,  [place].   ________________________________________________________________________ WHAT IS A BLOOD TRANSFUSION? Blood Transfusion Information  A transfusion is the replacement of blood or some of its parts. Blood is made up of multiple cells which provide different functions. Red blood cells carry oxygen and are used for blood loss replacement. White blood cells fight against infection. Platelets control bleeding. Plasma helps clot blood. Other blood products are available for specialized needs, such as hemophilia or other clotting disorders. BEFORE THE TRANSFUSION  Who gives blood for transfusions?  Healthy volunteers who are fully evaluated to make sure their blood is safe. This is blood bank blood. Transfusion therapy is the safest it has ever been in the practice of medicine. Before blood is taken from a donor, a complete history is taken to make sure that person has no history of diseases nor engages in risky social behavior (examples are intravenous drug use or sexual activity with multiple partners). The donor's travel history is screened to minimize risk of transmitting infections, such as malaria. The donated blood is tested for signs of infectious diseases, such as HIV and hepatitis. The blood is then tested to be sure it is compatible with you in order to minimize the chance of a transfusion reaction. If you or a relative donates blood, this is often done in anticipation of surgery and is not appropriate for emergency situations. It takes many days to process the donated blood. RISKS AND COMPLICATIONS Although transfusion therapy is very safe and saves many lives, the main dangers of transfusion include:  Getting an infectious disease. Developing a transfusion reaction. This is an allergic reaction to something in the blood you were given. Every precaution is taken to prevent this. The decision to have a blood transfusion has been considered carefully by your caregiver before blood is  given. Blood is not given unless the benefits outweigh the risks. AFTER THE TRANSFUSION Right after receiving a blood  transfusion, you will usually feel much better and more energetic. This is especially true if your red blood cells have gotten low (anemic). The transfusion raises the level of the red blood cells which carry oxygen, and this usually causes an energy increase. The nurse administering the transfusion will monitor you carefully for complications. HOME CARE INSTRUCTIONS  No special instructions are needed after a transfusion. You may find your energy is better. Speak with your caregiver about any limitations on activity for underlying diseases you may have. SEEK MEDICAL CARE IF:  Your condition is not improving after your transfusion. You develop redness or irritation at the intravenous (IV) site. SEEK IMMEDIATE MEDICAL CARE IF:  Any of the following symptoms occur over the next 12 hours: Shaking chills. You have a temperature by mouth above 102 F (38.9 C), not controlled by medicine. Chest, back, or muscle pain. People around you feel you are not acting correctly or are confused. Shortness of breath or difficulty breathing. Dizziness and fainting. You get a rash or develop hives. You have a decrease in urine output. Your urine turns a dark color or changes to pink, red, or brown. Any of the following symptoms occur over the next 10 days: You have a temperature by mouth above 102 F (38.9 C), not controlled by medicine. Shortness of breath. Weakness after normal activity. The white part of the eye turns yellow (jaundice). You have a decrease in the amount of urine or are urinating less often. Your urine turns a dark color or changes to pink, red, or brown. Document Released: 08/07/2000 Document Revised: 11/02/2011 Document Reviewed: 03/26/2008 [facility] Patient Information 2014 [place], [place].  _______________________________________________________________________

## 2023-02-04 ENCOUNTER — Encounter (HOSPITAL_COMMUNITY)
Admission: RE | Admit: 2023-02-04 | Discharge: 2023-02-04 | Disposition: A | Discharge status: Home / Self Care | Payer: No Typology Code available for payment source | Source: Ambulatory Visit | Attending: Surgery | Admitting: Surgery

## 2023-02-04 ENCOUNTER — Other Ambulatory Visit: Payer: Self-pay

## 2023-02-04 ENCOUNTER — Encounter (HOSPITAL_COMMUNITY): Payer: Self-pay

## 2023-02-04 DIAGNOSIS — Z01812 Encounter for preprocedural laboratory examination: Secondary | ICD-10-CM | POA: Diagnosis not present

## 2023-02-04 DIAGNOSIS — R739 Hyperglycemia, unspecified: Secondary | ICD-10-CM | POA: Insufficient documentation

## 2023-02-04 DIAGNOSIS — Z01818 Encounter for other preprocedural examination: Secondary | ICD-10-CM

## 2023-02-04 HISTORY — DX: Depression, unspecified: F32.A

## 2023-02-04 HISTORY — DX: Anxiety disorder, unspecified: F41.9

## 2023-02-04 HISTORY — DX: Anemia, unspecified: D64.9

## 2023-02-04 LAB — CBC WITH DIFFERENTIAL/PLATELET
Abs Immature Granulocytes: 0.01 10*3/uL (ref 0.00–0.07)
Basophils Absolute: 0.1 10*3/uL (ref 0.0–0.1)
Basophils Relative: 1 %
Eosinophils Absolute: 0.4 10*3/uL (ref 0.0–0.5)
Eosinophils Relative: 10 %
HCT: 40.6 % (ref 39.0–52.0)
Hemoglobin: 14.6 g/dL (ref 13.0–17.0)
Immature Granulocytes: 0 %
Lymphocytes Relative: 16 %
Lymphs Abs: 0.7 10*3/uL (ref 0.7–4.0)
MCH: 33.6 pg (ref 26.0–34.0)
MCHC: 36 g/dL (ref 30.0–36.0)
MCV: 93.3 fL (ref 80.0–100.0)
Monocytes Absolute: 0.5 10*3/uL (ref 0.1–1.0)
Monocytes Relative: 12 %
Neutro Abs: 2.6 10*3/uL (ref 1.7–7.7)
Neutrophils Relative %: 61 %
Platelets: 169 10*3/uL (ref 150–400)
RBC: 4.35 MIL/uL (ref 4.22–5.81)
RDW: 12.6 % (ref 11.5–15.5)
WBC: 4.2 10*3/uL (ref 4.0–10.5)
nRBC: 0 % (ref 0.0–0.2)

## 2023-02-04 LAB — HEMOGLOBIN A1C
Hgb A1c MFr Bld: 4.2 % — ABNORMAL LOW (ref 4.8–5.6)
Mean Plasma Glucose: 73.84 mg/dL

## 2023-02-04 LAB — COMPREHENSIVE METABOLIC PANEL
ALT: 18 U/L (ref 0–44)
AST: 21 U/L (ref 15–41)
Albumin: 3.9 g/dL (ref 3.5–5.0)
Alkaline Phosphatase: 115 U/L (ref 38–126)
Anion gap: 5 (ref 5–15)
BUN: 17 mg/dL (ref 6–20)
CO2: 24 mmol/L (ref 22–32)
Calcium: 8.9 mg/dL (ref 8.9–10.3)
Chloride: 108 mmol/L (ref 98–111)
Creatinine, Ser: 0.93 mg/dL (ref 0.61–1.24)
GFR, Estimated: >60 mL/min (ref >60)
Glucose, Bld: 101 mg/dL — ABNORMAL HIGH (ref 70–99)
Potassium: 4.1 mmol/L (ref 3.5–5.1)
Sodium: 137 mmol/L (ref 135–145)
Total Bilirubin: 1.2 mg/dL (ref 0.3–1.2)
Total Protein: 7.1 g/dL (ref 6.5–8.1)

## 2023-02-04 LAB — TYPE AND SCREEN
ABO/RH(D): B POS
Antibody Screen: NEGATIVE

## 2023-02-04 NOTE — Consult Note (Addendum)
WOC Nurse requested for preoperative stoma site marking. Discussed surgical procedure and stoma creation with patient and family.  Explained role of the WOC nurse team.  Provided the patient with educational booklet and provided samples of pouching options.  Answered patient and family questions.   Examined patient sitting, and standing in order to place the marking in the patient's visual field, away from any creases or abdominal contour issues and within the rectus muscle.  Attempted to mark below the patient's belt line, but this was not possible, since a significant crease occurs lower on the abd when the patient leans forward which should be avoided if possible, and I had to mark higher then usual for this reason.  Marked for colostomy in the LLQ  __3__ cm to the left of the umbilicus and __6__cm above the umbilicus.  Marked for ileostomy in the RLQ  _3__cm to the right of the umbilicus and  _6__ cm above the umbilicus.  Patient's abdomen cleansed with CHG wipes at site markings, allowed to air dry prior to marking. Covered mark with thin film transparent dressing to preserve mark until date of surgery. Provided with marking pen and told to re-color if the marks begin to fade prior to admission.   WOC Nurse team will follow up with patient after surgery for continued ostomy care and teaching.  Thank-you,  Cammie Mcgee MSN, RN, CWOCN, [place], CNS [phone]

## 2023-02-09 ENCOUNTER — Encounter: Payer: Self-pay | Admitting: Hematology

## 2023-02-10 ENCOUNTER — Encounter: Payer: Self-pay | Admitting: Hematology

## 2023-02-16 ENCOUNTER — Other Ambulatory Visit: Payer: Self-pay | Admitting: Hematology

## 2023-02-17 ENCOUNTER — Other Ambulatory Visit (HOSPITAL_COMMUNITY): Payer: Self-pay

## 2023-02-17 ENCOUNTER — Inpatient Hospital Stay (HOSPITAL_COMMUNITY): Payer: 59 | Admitting: Physician Assistant

## 2023-02-17 ENCOUNTER — Inpatient Hospital Stay (HOSPITAL_COMMUNITY)
Admission: RE | Admit: 2023-02-17 | Discharge: 2023-02-20 | DRG: 330 | Disposition: A | Discharge status: Home / Self Care | Payer: 59 | Attending: Surgery | Admitting: Surgery

## 2023-02-17 ENCOUNTER — Encounter (HOSPITAL_COMMUNITY): Payer: Self-pay | Admitting: Surgery

## 2023-02-17 ENCOUNTER — Inpatient Hospital Stay (HOSPITAL_COMMUNITY): Payer: 59 | Admitting: Anesthesiology

## 2023-02-17 ENCOUNTER — Encounter (HOSPITAL_COMMUNITY)
Admission: RE | Disposition: A | Discharge status: Home / Self Care | Payer: Self-pay | Source: Home / Self Care | Attending: Surgery

## 2023-02-17 ENCOUNTER — Other Ambulatory Visit: Payer: Self-pay

## 2023-02-17 DIAGNOSIS — Z87891 Personal history of nicotine dependence: Secondary | ICD-10-CM | POA: Diagnosis not present

## 2023-02-17 DIAGNOSIS — Z885 Allergy status to narcotic agent status: Secondary | ICD-10-CM | POA: Diagnosis not present

## 2023-02-17 DIAGNOSIS — Z800 Family history of malignant neoplasm of digestive organs: Secondary | ICD-10-CM

## 2023-02-17 DIAGNOSIS — Z8249 Family history of ischemic heart disease and other diseases of the circulatory system: Secondary | ICD-10-CM | POA: Diagnosis not present

## 2023-02-17 DIAGNOSIS — C775 Secondary and unspecified malignant neoplasm of intrapelvic lymph nodes: Secondary | ICD-10-CM | POA: Diagnosis present

## 2023-02-17 DIAGNOSIS — D126 Benign neoplasm of colon, unspecified: Secondary | ICD-10-CM | POA: Diagnosis not present

## 2023-02-17 DIAGNOSIS — Z91040 Latex allergy status: Secondary | ICD-10-CM

## 2023-02-17 DIAGNOSIS — C20 Malignant neoplasm of rectum: Secondary | ICD-10-CM | POA: Diagnosis not present

## 2023-02-17 DIAGNOSIS — C19 Malignant neoplasm of rectosigmoid junction: Principal | ICD-10-CM | POA: Diagnosis present

## 2023-02-17 HISTORY — PX: XI ROBOTIC ASSISTED LOWER ANTERIOR RESECTION: SHX6558

## 2023-02-17 HISTORY — PX: FLEXIBLE SIGMOIDOSCOPY: SHX5431

## 2023-02-17 LAB — ABO/RH: ABO/RH(D): B POS

## 2023-02-17 SURGERY — RESECTION, RECTUM, LOW ANTERIOR, ROBOT-ASSISTED
Anesthesia: General

## 2023-02-17 MED ORDER — ESCITALOPRAM OXALATE 10 MG PO TABS
10.0000 mg | ORAL_TABLET | Freq: Every day | ORAL | Status: DC
  Administered 2023-02-18 – 2023-02-20 (×3): 10 mg via ORAL
  Filled 2023-02-17 (×3): qty 1

## 2023-02-17 MED ORDER — POLYETHYLENE GLYCOL 3350 17 GM/SCOOP PO POWD: 1.0000 | Freq: Once | ORAL | Status: DC

## 2023-02-17 MED ORDER — ONDANSETRON HCL 4 MG/2ML IJ SOLN: 4.0000 mg | Freq: Four times a day (QID) | INTRAMUSCULAR | Status: DC | PRN

## 2023-02-17 MED ORDER — BUPIVACAINE LIPOSOME 1.3 % IJ SUSP
INTRAMUSCULAR | Status: DC | PRN
  Administered 2023-02-17: 20 mL

## 2023-02-17 MED ORDER — BUPIVACAINE-EPINEPHRINE (PF) 0.25% -1:200000 IJ SOLN
INTRAMUSCULAR | Status: DC | PRN
  Administered 2023-02-17: 30 mL

## 2023-02-17 MED ORDER — FENTANYL CITRATE (PF) 250 MCG/5ML IJ SOLN
INTRAMUSCULAR | Status: AC
  Filled 2023-02-17: qty 5

## 2023-02-17 MED ORDER — PHENYLEPHRINE HCL (PRESSORS) 10 MG/ML IV SOLN
INTRAVENOUS | Status: AC
  Filled 2023-02-17: qty 1

## 2023-02-17 MED ORDER — LIDOCAINE HCL (CARDIAC) PF 100 MG/5ML IV SOSY
PREFILLED_SYRINGE | INTRAVENOUS | Status: DC | PRN
  Administered 2023-02-17: 60 mg via INTRAVENOUS

## 2023-02-17 MED ORDER — ROCURONIUM BROMIDE 100 MG/10ML IV SOLN
INTRAVENOUS | Status: DC | PRN
  Administered 2023-02-17: 20 mg via INTRAVENOUS
  Administered 2023-02-17: 75 mg via INTRAVENOUS
  Administered 2023-02-17: 25 mg via INTRAVENOUS

## 2023-02-17 MED ORDER — SIMETHICONE 80 MG PO CHEW: 40.0000 mg | CHEWABLE_TABLET | Freq: Four times a day (QID) | ORAL | Status: DC | PRN

## 2023-02-17 MED ORDER — LIDOCAINE HCL (PF) 2 % IJ SOLN
INTRAMUSCULAR | Status: DC | PRN
  Administered 2023-02-17: 1.5 mg/kg/h via INTRADERMAL

## 2023-02-17 MED ORDER — ONDANSETRON HCL 4 MG PO TABS: 4.0000 mg | ORAL_TABLET | Freq: Four times a day (QID) | ORAL | Status: DC | PRN

## 2023-02-17 MED ORDER — PROPOFOL 10 MG/ML IV BOLUS
INTRAVENOUS | Status: AC
  Filled 2023-02-17: qty 20

## 2023-02-17 MED ORDER — KETAMINE HCL 50 MG/5ML IJ SOSY
PREFILLED_SYRINGE | INTRAMUSCULAR | Status: AC
  Filled 2023-02-17: qty 5

## 2023-02-17 MED ORDER — DROPERIDOL 2.5 MG/ML IJ SOLN
INTRAMUSCULAR | Status: DC | PRN
  Administered 2023-02-17: .625 mg via INTRAVENOUS

## 2023-02-17 MED ORDER — ORAL CARE MOUTH RINSE: 15.0000 mL | Freq: Once | OROMUCOSAL | Status: AC

## 2023-02-17 MED ORDER — METRONIDAZOLE 500 MG PO TABS: 1000.0000 mg | ORAL_TABLET | ORAL | Status: DC

## 2023-02-17 MED ORDER — HYDROMORPHONE HCL 1 MG/ML IJ SOLN
INTRAMUSCULAR | Status: DC | PRN
  Administered 2023-02-17 (×2): .5 mg via INTRAVENOUS

## 2023-02-17 MED ORDER — ONDANSETRON HCL 4 MG/2ML IJ SOLN: 4.0000 mg | Freq: Once | INTRAMUSCULAR | Status: DC | PRN

## 2023-02-17 MED ORDER — HYDROMORPHONE HCL 2 MG/ML IJ SOLN
INTRAMUSCULAR | Status: AC
  Filled 2023-02-17: qty 1

## 2023-02-17 MED ORDER — OXYCODONE HCL 5 MG PO TABS: 5.0000 mg | ORAL_TABLET | Freq: Once | ORAL | Status: DC | PRN

## 2023-02-17 MED ORDER — LIDOCAINE HCL (PF) 2 % IJ SOLN
INTRAMUSCULAR | Status: AC
  Filled 2023-02-17: qty 15

## 2023-02-17 MED ORDER — CHLORHEXIDINE GLUCONATE CLOTH 2 % EX PADS: 6.0000 | MEDICATED_PAD | Freq: Once | CUTANEOUS | Status: DC

## 2023-02-17 MED ORDER — ACETAMINOPHEN 500 MG PO TABS
1000.0000 mg | ORAL_TABLET | Freq: Four times a day (QID) | ORAL | Status: DC
  Administered 2023-02-17 – 2023-02-20 (×11): 1000 mg via ORAL
  Filled 2023-02-17 (×11): qty 2

## 2023-02-17 MED ORDER — TRAMADOL HCL 50 MG PO TABS
50.0000 mg | ORAL_TABLET | Freq: Four times a day (QID) | ORAL | Status: DC | PRN
  Administered 2023-02-17 – 2023-02-19 (×5): 50 mg via ORAL
  Filled 2023-02-17 (×5): qty 1

## 2023-02-17 MED ORDER — DEXAMETHASONE SODIUM PHOSPHATE 10 MG/ML IJ SOLN
INTRAMUSCULAR | Status: AC
  Filled 2023-02-17: qty 1

## 2023-02-17 MED ORDER — ALVIMOPAN 12 MG PO CAPS
12.0000 mg | ORAL_CAPSULE | ORAL | Status: AC
  Administered 2023-02-17: 12 mg via ORAL
  Filled 2023-02-17: qty 1

## 2023-02-17 MED ORDER — DEXAMETHASONE SODIUM PHOSPHATE 10 MG/ML IJ SOLN
INTRAMUSCULAR | Status: DC | PRN
  Administered 2023-02-17: 10 mg via INTRAVENOUS

## 2023-02-17 MED ORDER — ROCURONIUM BROMIDE 10 MG/ML (PF) SYRINGE
PREFILLED_SYRINGE | INTRAVENOUS | Status: AC
  Filled 2023-02-17: qty 10

## 2023-02-17 MED ORDER — DIPHENHYDRAMINE HCL 50 MG/ML IJ SOLN: 12.5000 mg | Freq: Four times a day (QID) | INTRAMUSCULAR | Status: DC | PRN

## 2023-02-17 MED ORDER — SUGAMMADEX SODIUM 200 MG/2ML IV SOLN
INTRAVENOUS | Status: DC | PRN
  Administered 2023-02-17: 200 mg via INTRAVENOUS

## 2023-02-17 MED ORDER — FENTANYL CITRATE (PF) 100 MCG/2ML IJ SOLN
INTRAMUSCULAR | Status: DC | PRN
  Administered 2023-02-17: 50 ug via INTRAVENOUS
  Administered 2023-02-17: 100 ug via INTRAVENOUS

## 2023-02-17 MED ORDER — INDOCYANINE GREEN 25 MG IV SOLR
INTRAVENOUS | Status: DC | PRN
  Administered 2023-02-17: 2.5 mg via INTRAVENOUS

## 2023-02-17 MED ORDER — IBUPROFEN 200 MG PO TABS
600.0000 mg | ORAL_TABLET | Freq: Four times a day (QID) | ORAL | Status: DC | PRN
  Administered 2023-02-18: 600 mg via ORAL
  Filled 2023-02-17: qty 3

## 2023-02-17 MED ORDER — GABAPENTIN 300 MG PO CAPS
300.0000 mg | ORAL_CAPSULE | Freq: Two times a day (BID) | ORAL | Status: DC
  Administered 2023-02-17 – 2023-02-20 (×6): 300 mg via ORAL
  Filled 2023-02-17 (×2): qty 1
  Filled 2023-02-17: qty 3
  Filled 2023-02-17 (×3): qty 1

## 2023-02-17 MED ORDER — DROPERIDOL 2.5 MG/ML IJ SOLN
INTRAMUSCULAR | Status: AC
  Filled 2023-02-17: qty 2

## 2023-02-17 MED ORDER — CHLORHEXIDINE GLUCONATE 0.12 % MT SOLN
15.0000 mL | Freq: Once | OROMUCOSAL | Status: AC
  Administered 2023-02-17: 15 mL via OROMUCOSAL

## 2023-02-17 MED ORDER — HEPARIN SODIUM (PORCINE) 5000 UNIT/ML IJ SOLN
5000.0000 [IU] | Freq: Three times a day (TID) | INTRAMUSCULAR | Status: DC
  Administered 2023-02-17 – 2023-02-20 (×9): 5000 [IU] via SUBCUTANEOUS
  Filled 2023-02-17 (×9): qty 1

## 2023-02-17 MED ORDER — DIPHENHYDRAMINE HCL 12.5 MG/5ML PO ELIX: 12.5000 mg | ORAL_SOLUTION | Freq: Four times a day (QID) | ORAL | Status: DC | PRN

## 2023-02-17 MED ORDER — ALVIMOPAN 12 MG PO CAPS: 12.0000 mg | ORAL_CAPSULE | Freq: Two times a day (BID) | ORAL | Status: DC

## 2023-02-17 MED ORDER — ONDANSETRON HCL 4 MG/2ML IJ SOLN
INTRAMUSCULAR | Status: DC | PRN
  Administered 2023-02-17: 4 mg via INTRAVENOUS

## 2023-02-17 MED ORDER — HEPARIN SODIUM (PORCINE) 5000 UNIT/ML IJ SOLN
5000.0000 [IU] | Freq: Once | INTRAMUSCULAR | Status: AC
  Administered 2023-02-17: 5000 [IU] via SUBCUTANEOUS
  Filled 2023-02-17: qty 1

## 2023-02-17 MED ORDER — TRAMADOL HCL 50 MG PO TABS
50.0000 mg | ORAL_TABLET | Freq: Four times a day (QID) | ORAL | 0 refills | Status: AC | PRN
  Filled 2023-02-17: qty 15, 4d supply, fill #0

## 2023-02-17 MED ORDER — LACTATED RINGERS IR SOLN
Status: DC | PRN
  Administered 2023-02-17: 1000 mL

## 2023-02-17 MED ORDER — 0.9 % SODIUM CHLORIDE (POUR BTL) OPTIME
TOPICAL | Status: DC | PRN
  Administered 2023-02-17: 1000 mL

## 2023-02-17 MED ORDER — BISACODYL 5 MG PO TBEC: 20.0000 mg | DELAYED_RELEASE_TABLET | Freq: Once | ORAL | Status: DC

## 2023-02-17 MED ORDER — ENSURE PRE-SURGERY PO LIQD: 592.0000 mL | Freq: Once | ORAL | Status: DC

## 2023-02-17 MED ORDER — LACTATED RINGERS IV SOLN: INTRAVENOUS | Status: DC | PRN

## 2023-02-17 MED ORDER — ENSURE SURGERY PO LIQD
237.0000 mL | Freq: Two times a day (BID) | ORAL | Status: DC
  Administered 2023-02-18: 237 mL via ORAL

## 2023-02-17 MED ORDER — KETAMINE HCL 10 MG/ML IJ SOLN
INTRAMUSCULAR | Status: DC | PRN
  Administered 2023-02-17: 50 mg via INTRAVENOUS

## 2023-02-17 MED ORDER — HYDRALAZINE HCL 20 MG/ML IJ SOLN: 10.0000 mg | INTRAMUSCULAR | Status: DC | PRN

## 2023-02-17 MED ORDER — BUPIVACAINE LIPOSOME 1.3 % IJ SUSP
INTRAMUSCULAR | Status: AC
  Filled 2023-02-17: qty 20

## 2023-02-17 MED ORDER — SODIUM CHLORIDE 0.9 % IV SOLN
2.0000 g | INTRAVENOUS | Status: AC
  Administered 2023-02-17: 2 g via INTRAVENOUS
  Filled 2023-02-17: qty 2

## 2023-02-17 MED ORDER — PROPOFOL 10 MG/ML IV BOLUS
INTRAVENOUS | Status: DC | PRN
  Administered 2023-02-17: 150 mg via INTRAVENOUS

## 2023-02-17 MED ORDER — NEOMYCIN SULFATE 500 MG PO TABS: 1000.0000 mg | ORAL_TABLET | ORAL | Status: DC

## 2023-02-17 MED ORDER — ALUM & MAG HYDROXIDE-SIMETH 200-200-20 MG/5ML PO SUSP: 30.0000 mL | Freq: Four times a day (QID) | ORAL | Status: DC | PRN

## 2023-02-17 MED ORDER — HYDROMORPHONE HCL 1 MG/ML IJ SOLN: 0.2500 mg | INTRAMUSCULAR | Status: DC | PRN

## 2023-02-17 MED ORDER — MIDAZOLAM HCL 5 MG/5ML IJ SOLN
INTRAMUSCULAR | Status: DC | PRN
  Administered 2023-02-17: 2 mg via INTRAVENOUS

## 2023-02-17 MED ORDER — ENSURE PRE-SURGERY PO LIQD: 296.0000 mL | Freq: Once | ORAL | Status: DC

## 2023-02-17 MED ORDER — ONDANSETRON HCL 4 MG/2ML IJ SOLN
INTRAMUSCULAR | Status: AC
  Filled 2023-02-17: qty 2

## 2023-02-17 MED ORDER — HYDROMORPHONE HCL 1 MG/ML IJ SOLN: 0.5000 mg | INTRAMUSCULAR | Status: DC | PRN

## 2023-02-17 MED ORDER — MIDAZOLAM HCL 2 MG/2ML IJ SOLN
INTRAMUSCULAR | Status: AC
  Filled 2023-02-17: qty 2

## 2023-02-17 MED ORDER — LACTATED RINGERS IV SOLN: INTRAVENOUS | Status: DC

## 2023-02-17 MED ORDER — BUPIVACAINE LIPOSOME 1.3 % IJ SUSP: 20.0000 mL | Freq: Once | INTRAMUSCULAR | Status: DC

## 2023-02-17 MED ORDER — ACETAMINOPHEN 500 MG PO TABS
1000.0000 mg | ORAL_TABLET | ORAL | Status: AC
  Administered 2023-02-17: 1000 mg via ORAL
  Filled 2023-02-17: qty 2

## 2023-02-17 MED ORDER — BUPIVACAINE-EPINEPHRINE 0.25% -1:200000 IJ SOLN
INTRAMUSCULAR | Status: AC
  Filled 2023-02-17: qty 1

## 2023-02-17 MED ORDER — OXYCODONE HCL 5 MG/5ML PO SOLN: 5.0000 mg | Freq: Once | ORAL | Status: DC | PRN

## 2023-02-17 SURGICAL SUPPLY — 113 items
ADH SKN CLS APL DERMABOND .7 (GAUZE/BANDAGES/DRESSINGS) ×2
APL PRP STRL LF DISP 70% ISPRP (MISCELLANEOUS) ×2
APPLIER CLIP 5 13 M/L LIGAMAX5 (MISCELLANEOUS)
APPLIER CLIP ROT 10 11.4 M/L (STAPLE)
APR CLP MED LRG 11.4X10 (STAPLE)
APR CLP MED LRG 5 ANG JAW (MISCELLANEOUS)
BAG COUNTER SPONGE SURGICOUNT (BAG) IMPLANT
BAG SPNG CNTER NS LX DISP (BAG) ×2
BLADE EXTENDED COATED 6.5IN (ELECTRODE) ×2 IMPLANT
CANNULA REDUCER 12-8 DVNC XI (CANNULA) ×2 IMPLANT
CELLS DAT CNTRL 66122 CELL SVR (MISCELLANEOUS) IMPLANT
CHLORAPREP W/TINT 26 (MISCELLANEOUS) ×2 IMPLANT
CLIP APPLIE 5 13 M/L LIGAMAX5 (MISCELLANEOUS) IMPLANT
CLIP APPLIE ROT 10 11.4 M/L (STAPLE) IMPLANT
CLIP LIGATING HEM O LOK PURPLE (MISCELLANEOUS) IMPLANT
CLIP LIGATING HEMO O LOK GREEN (MISCELLANEOUS) IMPLANT
COVER SURGICAL LIGHT HANDLE (MISCELLANEOUS) ×4 IMPLANT
COVER TIP SHEARS 8 DVNC (MISCELLANEOUS) ×2 IMPLANT
DEFOGGER SCOPE WARMER CLEARIFY (MISCELLANEOUS) ×2 IMPLANT
DERMABOND ADVANCED .7 DNX12 (GAUZE/BANDAGES/DRESSINGS) IMPLANT
DEVICE TROCAR PUNCTURE CLOSURE (ENDOMECHANICALS) IMPLANT
DRAIN CHANNEL 19F RND (DRAIN) ×2 IMPLANT
DRAPE ARM DVNC X/XI (DISPOSABLE) ×8 IMPLANT
DRAPE COLUMN DVNC XI (DISPOSABLE) ×2 IMPLANT
DRAPE SURG IRRIG POUCH 19X23 (DRAPES) ×2 IMPLANT
DRIVER NDL LRG 8 DVNC XI (INSTRUMENTS) ×2 IMPLANT
DRIVER NDLE LRG 8 DVNC XI (INSTRUMENTS) ×2 IMPLANT
DRSG OPSITE POSTOP 4X10 (GAUZE/BANDAGES/DRESSINGS) IMPLANT
DRSG OPSITE POSTOP 4X6 (GAUZE/BANDAGES/DRESSINGS) IMPLANT
DRSG OPSITE POSTOP 4X8 (GAUZE/BANDAGES/DRESSINGS) IMPLANT
DRSG TEGADERM 2-3/8X2-3/4 SM (GAUZE/BANDAGES/DRESSINGS) ×10 IMPLANT
DRSG TEGADERM 4X4.75 (GAUZE/BANDAGES/DRESSINGS) ×2 IMPLANT
ELECT REM PT RETURN 15FT ADLT (MISCELLANEOUS) ×2 IMPLANT
ENDOLOOP SUT PDS II 0 18 (SUTURE) IMPLANT
EVACUATOR SILICONE 100CC (DRAIN) ×2 IMPLANT
GAUZE SPONGE 2X2 8PLY STRL LF (GAUZE/BANDAGES/DRESSINGS) ×2 IMPLANT
GAUZE SPONGE 4X4 12PLY STRL (GAUZE/BANDAGES/DRESSINGS) IMPLANT
GLOVE BIO SURGEON STRL SZ7.5 (GLOVE) ×6 IMPLANT
GLOVE INDICATOR 8.0 STRL GRN (GLOVE) ×6 IMPLANT
GOWN SRG XL LVL 4 BRTHBL STRL (GOWNS) ×2 IMPLANT
GOWN STRL NON-REIN XL LVL4 (GOWNS) ×2
GOWN STRL REUS W/ TWL XL LVL3 (GOWN DISPOSABLE) ×10 IMPLANT
GOWN STRL REUS W/TWL XL LVL3 (GOWN DISPOSABLE) ×10
GRASPER SUT TROCAR 14GX15 (MISCELLANEOUS) IMPLANT
GRASPER TIP-UP FEN DVNC XI (INSTRUMENTS) ×2 IMPLANT
HOLDER FOLEY CATH W/STRAP (MISCELLANEOUS) ×2 IMPLANT
IRRIG SUCT STRYKERFLOW 2 WTIP (MISCELLANEOUS) ×2
IRRIGATION SUCT STRKRFLW 2 WTP (MISCELLANEOUS) ×2 IMPLANT
KIT PROCEDURE DVNC SI (MISCELLANEOUS) IMPLANT
KIT TURNOVER KIT A (KITS) IMPLANT
NDL INSUFFLATION 14GA 120MM (NEEDLE) ×2 IMPLANT
NEEDLE INSUFFLATION 14GA 120MM (NEEDLE) ×2 IMPLANT
PACK CARDIOVASCULAR III (CUSTOM PROCEDURE TRAY) ×2 IMPLANT
PACK COLON (CUSTOM PROCEDURE TRAY) ×2 IMPLANT
PAD POSITIONING PINK XL (MISCELLANEOUS) ×2 IMPLANT
PENCIL SMOKE EVACUATOR (MISCELLANEOUS) IMPLANT
PROTECTOR NERVE ULNAR (MISCELLANEOUS) ×4 IMPLANT
RELOAD STAPLE 45 3.5 BLU DVNC (STAPLE) IMPLANT
RELOAD STAPLE 45 4.3 GRN DVNC (STAPLE) IMPLANT
RELOAD STAPLE 60 3.5 BLU DVNC (STAPLE) IMPLANT
RELOAD STAPLE 60 4.3 GRN DVNC (STAPLE) IMPLANT
RELOAD STAPLER 3.5X45 BLU DVNC (STAPLE) IMPLANT
RELOAD STAPLER 3.5X60 BLU DVNC (STAPLE) IMPLANT
RELOAD STAPLER 4.3X45 GRN DVNC (STAPLE) IMPLANT
RELOAD STAPLER 4.3X60 GRN DVNC (STAPLE) ×4 IMPLANT
RETRACTOR WND ALEXIS 18 MED (MISCELLANEOUS) IMPLANT
RTRCTR WOUND ALEXIS 18CM MED (MISCELLANEOUS)
SCISSORS LAP 5X35 DISP (ENDOMECHANICALS) IMPLANT
SCISSORS MNPLR CVD DVNC XI (INSTRUMENTS) ×2 IMPLANT
SEAL UNIV 5-12 XI (MISCELLANEOUS) ×8 IMPLANT
SEALER VESSEL EXT DVNC XI (MISCELLANEOUS) ×2 IMPLANT
SLEEVE ADV FIXATION 5X100MM (TROCAR) IMPLANT
SOL ELECTROSURG ANTI STICK (MISCELLANEOUS) ×2
SOLUTION ELECTROSURG ANTI STCK (MISCELLANEOUS) ×2 IMPLANT
SPIKE FLUID TRANSFER (MISCELLANEOUS) ×2 IMPLANT
STAPLER 60 SUREFORM DVNC (STAPLE) IMPLANT
STAPLER ECHELON POWER CIR 29 (STAPLE) IMPLANT
STAPLER ECHELON POWER CIR 31 (STAPLE) IMPLANT
STAPLER RELOAD 3.5X45 BLU DVNC (STAPLE)
STAPLER RELOAD 3.5X60 BLU DVNC (STAPLE)
STAPLER RELOAD 4.3X45 GRN DVNC (STAPLE)
STAPLER RELOAD 4.3X60 GRN DVNC (STAPLE) ×4
STOPCOCK 4 WAY LG BORE MALE ST (IV SETS) ×4 IMPLANT
SURGILUBE 2OZ TUBE FLIPTOP (MISCELLANEOUS) ×2 IMPLANT
SUT MNCRL AB 4-0 PS2 18 (SUTURE) ×2 IMPLANT
SUT PDS AB 1 CT1 27 (SUTURE) IMPLANT
SUT PDS AB 1 TP1 96 (SUTURE) IMPLANT
SUT PROLENE 0 CT 2 (SUTURE) IMPLANT
SUT PROLENE 2 0 KS (SUTURE) ×2 IMPLANT
SUT PROLENE 2 0 SH DA (SUTURE) IMPLANT
SUT SILK 2 0 (SUTURE)
SUT SILK 2 0 SH CR/8 (SUTURE) IMPLANT
SUT SILK 2-0 18XBRD TIE 12 (SUTURE) IMPLANT
SUT SILK 3 0 (SUTURE) ×2
SUT SILK 3 0 SH CR/8 (SUTURE) ×2 IMPLANT
SUT SILK 3-0 18XBRD TIE 12 (SUTURE) ×2 IMPLANT
SUT V-LOC BARB 180 2/0GR6 GS22 (SUTURE)
SUT VIC AB 3-0 SH 18 (SUTURE) IMPLANT
SUT VIC AB 3-0 SH 27 (SUTURE)
SUT VIC AB 3-0 SH 27XBRD (SUTURE) IMPLANT
SUT VICRYL 0 UR6 27IN ABS (SUTURE) ×2 IMPLANT
SUTURE V-LC BRB 180 2/0GR6GS22 (SUTURE) IMPLANT
SYR 10ML LL (SYRINGE) ×2 IMPLANT
SYS LAPSCP GELPORT 120MM (MISCELLANEOUS)
SYS WOUND ALEXIS 18CM MED (MISCELLANEOUS) ×2
SYSTEM LAPSCP GELPORT 120MM (MISCELLANEOUS) IMPLANT
SYSTEM WOUND ALEXIS 18CM MED (MISCELLANEOUS) ×2 IMPLANT
TAPE UMBILICAL 1/8 X36 TWILL (MISCELLANEOUS) ×2 IMPLANT
TOWEL OR NON WOVEN STRL DISP B (DISPOSABLE) ×2 IMPLANT
TRAY FOLEY MTR SLVR 16FR STAT (SET/KITS/TRAYS/PACK) ×2 IMPLANT
TROCAR ADV FIXATION 5X100MM (TROCAR) ×2 IMPLANT
TUBING CONNECTING 10 (TUBING) ×6 IMPLANT
TUBING INSUFFLATION 10FT LAP (TUBING) ×2 IMPLANT

## 2023-02-17 NOTE — Anesthesia Procedure Notes (Signed)
Procedure Name: Intubation Date/Time: 02/17/2023 8:35 AM  Performed by: Johnette Abraham, CRNAPre-anesthesia Checklist: Patient identified, Emergency Drugs available, Suction available and Patient being monitored Patient Re-evaluated:Patient Re-evaluated prior to induction Oxygen Delivery Method: Circle System Utilized Preoxygenation: Pre-oxygenation with 100% oxygen Induction Type: IV induction Ventilation: Mask ventilation without difficulty Laryngoscope Size: Mac and 4 Grade View: Grade II Tube type: Oral Tube size: 8.0 mm Number of attempts: 1 Airway Equipment and Method: Stylet and Oral airway Placement Confirmation: ETT inserted through vocal cords under direct vision, positive ETCO2 and breath sounds checked- equal and bilateral Secured at: 24 cm Tube secured with: Tape Dental Injury: Teeth and Oropharynx as per pre-operative assessment

## 2023-02-17 NOTE — H&P (Signed)
CC: Here today for surgery  HPI: Troy Dillon is an 52 y.o. male with history of , whom is seen in the office today as a referral by Dr. Seymour Bars for evaluation of rectal cancer.   Cscope 03/2010 - Dr. Karilyn Cota - redundant hepatic flexure; 2 polyps from sigmoid removed. Tiny polyp at rectosigmoid jxn coagulated using snare tip.  Cscope 06/08/22 Dr. Marcha Solders- Impression:  - Malignant partially obstructing tumor in the rectum.  Biopsied. Tattooed.  - One large polyp at 40 cm proximal to the anus,  removed with a hot snare. Resected and retrieved.  Clips were placed.  - Three small polyps in the cecum, removed with a hot  snare. Resected and retrieved.   Mass in rectum showed adenocarcinoma by report of Dr. Mitzi Hansen. He met with Dr. Marcha Solders at [facility].  MRI Pelvis 06/12/22 - rectal mass cmriT3cN1 extending up into sigmoid; +EMVI. Mass is 7 cm from IAS. Appears just above reflection in mid and upper rectum.  CT CAP 06/12/22 -  1. Circumferential wall thickening of the upper/mid rectum may reflect patient's known rectal mass. 2. Mildly enlarged high perirectal lymph nodes measure up to 6 mm in short axis, nonspecific but suspicious for locoregional nodal disease. 3. No evidence of distant metastatic disease within the chest, abdomen or pelvis.  Following with Drs. Moody and Crystal Lawns.  He reports his colonoscopy was done for rectal bleeding which is also the reason for his colonoscopy back in 2011. He denies abdominal pain, nausea, vomiting, diarrhea.  Genetics negative  MRI Pelvis 10/13/22  *Decreased eccentric high rectal/low sigmoid wall thickening compatible with treatment response. Evaluation for restricted diffusion is limited on this examination secondary to technique however there is probable mild reduced ADC and increased DWI signal within portions of the tumor suggestive of residual disease. Consider further evaluation with colonoscopy. *Decreased size of the high mesorectal or  superior rectal lymph node.  He has met back with medical and now radiation oncology. Has been doing well. No complaints at present. He is here today with his wife.  INTERVAL HX Undergoing cXRT, completion date 12/18/22 Scheduled for surveillance CT CAP 01/15/2023  Denies any NSAID present. Here today with his wife. He feels well. No abdominal pain, nausea, vomiting, bloating, constipation.  PMH: Denies  PSH: Denies  FHx: Father and paternal grandparents had colon cancer. Denies any known family history of colorectal, breast, endometrial or ovarian cancer  Social Hx: Denies use of tobacco/drugs; social EtOH use. Here today with his wife. 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 No new medications/allergies. He states he is ready for surgery today. Reports he tolerated bowel prep with satisfactory result.   Past Medical History:  Diagnosis Date   Anemia    Anxiety    Depression    Rectal cancer (HCC) 06/08/2022    Past Surgical History:  Procedure Laterality Date   COLONOSCOPY  06/08/2022   EYE SURGERY     FRACTURE SURGERY     Right Femur- Cables in bone remain in place, rod removed in 2000   IR IMAGING GUIDED PORT INSERTION  06/22/2022   ORTHOPEDIC SURGERY      Family History  Problem Relation Age of Onset   Cancer Mother 81 - 37       GYN malignancy details unknown, TAH/BSO   Colon cancer Father 26   Heart attack Father    Colon cancer Paternal  Aunt 60   Colon cancer Paternal Grandmother 82    Social:  reports that he quit smoking about 21 years ago. His smoking use included cigarettes. He has a 15.00 pack-year smoking history. He has quit using smokeless tobacco.  His smokeless tobacco use included snuff. He reports current alcohol use. He reports that he does not use drugs.  Allergies:  Allergies  Allergen Reactions   Hydrocodone Itching   Latex     Eye irritation     Medications: I have  reviewed the patient's current medications.  No results found for this or any previous visit (from the past 48 hour(s)).  No results found.   PE Blood pressure 139/80, pulse 67, temperature 97.6 F (36.4 C), temperature source Oral, resp. rate 16, height 5\' 11"  (1.803 m), weight 83.4 kg, SpO2 97 %. Constitutional: NAD; conversant Eyes: Moist conjunctiva; no lid lag; anicteric Lungs: Normal respiratory effort CV: RRR GI: Abd soft, NT/ND; no palpable hepatosplenomegaly Psychiatric: Appropriate affect  No results found for this or any previous visit (from the past 48 hour(s)).  No results found.  A/P: Troy Dillon is an 52 y.o. male with cmriT3cN1 rectal adenocarcinoma, mid rectum  CT CAP 05/2022 no evidence of metastatic disease  cmriT3N1 mid to proximal rectal cancer  -We spent time today reviewing the relevant anatomy physiology of the GI tract and pathophysiology of colorectal cancer. We spent time discussing the overall treatment approach to local advanced rectal cancers and the potential role for surgery. We also touched base on the potential for complete clinical response with total neoadjuvant therapy. That said, he remains motivated to undergo all treatment and strongly favoring surgery. We work to give him an overview of what that would involve including robotic assisted low anterior resection, flexible sigmoidoscopy, diverting loop ileostomy. We spent time discussing what an ileostomy is and general expectations therein.  -His judgment and questions all seem appropriate and well thought out. It is apparent they have spent a fair amount of time researching this on their own as well.  -The anatomy and physiology of the GI tract was reviewed again with him and his wife today.   -As also outlined at previous visits, we have discussed various different treatment options going forward including surgery and data from the watch/wait series. He does have nodal involvement by MRI  criteria. We discussed potential limitations of watch and wait in this cohort as well. We discussed surveillance algorithms.   -We have also spent time discussing surgery as an approach-robotic assisted low anterior resection with diverting loop ileostomy, flexible sigmoidoscopy.  -They remain highly motivated to pursue surgery for a variety of reasons as has been outlined. This makes since to me as well  -The planned procedures, material risks (including, but not limited to, pain, bleeding, infection, scarring, need for blood transfusion, damage to surrounding structures- blood vessels/nerves/viscus/organs, damage to ureter, urine leak, leak from anastomosis, need for additional procedures, sexual dysfunction, scenarios where a stoma may be necessary and where it may be permanent, worsening of pre-existing medical conditions, hernia, recurrence, pneumonia, heart attack, stroke, death) benefits and alternatives to surgery were discussed at length. The patient's and his wife's questions were answered to their satisfaction, they voiced understanding and elected to proceed with surgery. Additionally, we discussed typical postoperative expectations and the recovery process.   Marin Olp, MD [facility], [facility]

## 2023-02-17 NOTE — Transfer of Care (Signed)
Immediate Anesthesia Transfer of Care Note  Patient: Troy Dillon  Procedure(s) Performed: XI ROBOTIC ASSISTED LOWER ANTERIOR RESECTION, DIVERTING LOOP ILEOSTOMY, BILATERAL TAP BLOCK, TISSUE PERFUSION ASSESSMENT VIA FIREFLY INJECTION (Bilateral) FLEXIBLE SIGMOIDOSCOPY  Patient Location: PACU  Anesthesia Type:GA combined with regional for post-op pain  Level of Consciousness: awake, oriented, sedated, and patient cooperative  Airway & Oxygen Therapy: Patient Spontanous Breathing and Patient connected to face mask oxygen  Post-op Assessment: Report given to RN and Post -op Vital signs reviewed and stable  Post vital signs: Reviewed and stable  Last Vitals:  Vitals Value Taken Time  BP 120/79 02/17/23 1217  Temp    Pulse 77 02/17/23 1222  Resp 11 02/17/23 1222  SpO2 99 % 02/17/23 1222  Vitals shown include unvalidated device data.  Last Pain:  Vitals:   02/17/23 0656  TempSrc: Oral  PainSc:       Patients Stated Pain Goal: 5 (02/17/23 7846)  Complications: No notable events documented.

## 2023-02-17 NOTE — Discharge Instructions (Signed)
POST OP INSTRUCTIONS AFTER COLON SURGERY  DIET: Be sure to include lots of fluids daily to stay hydrated - 64oz of water per day (8, 8 oz glasses).  Avoid fast food or heavy meals for the first couple of weeks as your are more likely to get nauseated. Avoid raw/uncooked fruits or vegetables for the first 4 weeks (its ok to have these if they are blended into smoothie form). If you have fruits/vegetables, make sure they are cooked until soft enough to mash on the roof of your mouth and chew your food well. Otherwise, diet as tolerated.  Take your usually prescribed home medications unless otherwise directed.  PAIN CONTROL: Pain is best controlled by a usual combination of three different methods TOGETHER: Ice/Heat Over the counter pain medication Prescription pain medication Most patients will experience some swelling and bruising around the surgical site.  Ice packs or heating pads (30-60 minutes up to 6 times a day) will help. Some people prefer to use ice alone, heat alone, alternating between ice & heat.  Experiment to what works for you.  Swelling and bruising can take several weeks to resolve.   It is helpful to take an over-the-counter pain medication regularly for the first few weeks: Ibuprofen (Motrin/Advil) - 200mg  tabs - take 3 tabs (600mg ) every 6 hours as needed for pain (unless you have been directed previously to avoid NSAIDs/ibuprofen) Acetaminophen (Tylenol) - you may take 650mg  every 6 hours as needed. You can take this with motrin as they act differently on the body. If you are taking a narcotic pain medication that has acetaminophen in it, do not take over the counter tylenol at the same time. NOTE: You may take both of these medications together - most patients  find it most helpful when alternating between the two (i.e. Ibuprofen at 6am, tylenol at 9am, ibuprofen at 12pm ..Marland Kitchen) A  prescription for pain medication should be given to you upon discharge.  Take your pain medication as  prescribed if your pain is not adequatly controlled with the over-the-counter pain reliefs mentioned above.  Avoid getting constipated.  Between the surgery and the pain medications, it is common to experience some constipation.  Increasing fluid intake and taking a fiber supplement (such as Metamucil, Citrucel, FiberCon, MiraLax, etc) 1-2 times a day regularly will usually help prevent this problem from occurring.  A mild laxative (prune juice, Milk of Magnesia, MiraLax, etc) should be taken according to package directions if there are no bowel movements after 48 hours.    Dressing: Your incisions are covered in Dermabond which is like sterile superglue for the skin. This will come off on it's own in a couple weeks. It is waterproof and you may bathe normally starting the day after your surgery in a shower. Avoid baths/pools/lakes/oceans until your wounds have fully healed.  Ileostomy:  You have a diverting loop ileostomy. From a dietary perspective, you may eat the foods you like, but foods with high amounts of sugar (concentrated sweets, coke, ice cream, etc) will often cause the volume of fluid coming from your ostomy to increase. It is important you stay hydrated and drink plenty of water or low-sugar gatorade. You should be urinating clear/pale urine ~5-6 times per day.  Empty and record the output of your ileostomy as instructed with our wound ostomy team.  Total the volumes coming out over 24 hours and keep a log of this.  Our goal is for this to be < 1.2 L (1,200 mL) over 24  hrs. if it is over this volume, you are at significantly increased risk for dehydration and we need to know.  Generally we can slow this down with over the counter Imodium.   ACTIVITIES as tolerated:   Avoid heavy lifting (>10lbs or 1 gallon of milk) for the next 6 weeks. You may resume regular daily activities as tolerated--such as daily self-care, walking, climbing stairs--gradually increasing activities as tolerated.  If  you can walk 30 minutes without difficulty, it is safe to try more intense activity such as jogging, treadmill, bicycling, low-impact aerobics.  DO NOT PUSH THROUGH PAIN.  Let pain be your guide: If it hurts to do something, don't do it. You may drive when you are no longer taking prescription pain medication, you can comfortably wear a seatbelt, and you can safely maneuver your car and apply brakes.  FOLLOW UP in our office Please call [facility] at [phone] to set up an appointment to see your surgeon in the office for a follow-up appointment approximately 2 weeks after your surgery. Make sure that you call for this appointment the day you arrive home to insure a convenient appointment time.  9. If you have disability or family leave forms that need to be completed, you may have them completed by your primary care physician's office; for return to work instructions, please ask our office staff and they will be happy to assist you in obtaining this documentation   When to call us [phone]: Poor pain control Reactions / problems with new medications (rash/itching, etc)  Fever over 101.5 F (38.5 C) Inability to urinate Nausea/vomiting Worsening swelling or bruising Continued bleeding from incision. Increased pain, redness, or drainage from the incision  The clinic staff is available to answer your questions during regular business hours (8:30am-5pm).  Please don't hesitate to call and ask to speak to one of our nurses for clinical concerns.   A surgeon from [name] is always on call at the hospitals   If you have a medical emergency, go to the nearest emergency room or call 911.  [facility] [address], [address], [place], [place]  [address] MAIN: [phone] FAX: [phone] [email or web address]

## 2023-02-17 NOTE — Op Note (Signed)
PATIENT: Troy Dillon  52 y.o. male  Patient Care Team: Practice, [facility] as PCP - General Branch, Alben Spittle, MD as PCP - Cardiology (Cardiology) Doreatha Massed, MD as Medical Oncologist (Medical Oncology) Therese Sarah, RN as Oncology Nurse Navigator (Medical Oncology)  PREOP DIAGNOSIS: RECTAL CANCER  POSTOP DIAGNOSIS: RECTAL CANCER  PROCEDURE:  Robotic assisted low anterior resection with double stapled colorectal anastomosis Diagnostic flexible sigmoidoscopy (necessary to confirm location of rectal cancer) Intraoperative assessment of perfusion using ICG fluorescence imaging Bilateral transversus abdominus plane (TAP) blocks  SURGEON: Stephanie Coup. Cliffton Asters, MD  ASSISTANT: Karie Soda, MD  ANESTHESIA: General endotracheal  EBL: 100 mL Total I/O In: 2100 [I.V.:2000; IV Piggyback:100] Out: 100 [Blood:100]  DRAINS: None  SPECIMEN:  Rectosigmoid colon-opened and proximal Distal anastomotic donut  COUNTS: Sponge, needle and instrument counts were reported correct x2  FINDINGS: ?Early/very small right inguinal hernia. Tattoo just above the peritoneal reflection.  No obvious mass.  Diagnostic flexible sigmoidoscopy was therefore carried out to facilitate identification of the location of the lesion.  This is located on the proximal valve of [name].  No evident metastatic disease on visceral parietal peritoneum or liver.  Robotic LAR carried out. A well perfused, tension free, hemostatic, air tight 31 mm EEA colorectal anastomosis fashioned 8 cm from the anal verge by flexible sigmoidoscopy.   NARRATIVE: Informed consent was verified. The patient was taken to the operating room, placed supine on the operating table and SCD's were applied. General endotracheal anesthesia was induced without difficulty. He was then positioned in the lithotomy position with Allen stirrups.  Pressure points were evaluated and padded.  A foley catheter was then placed by nursing under  sterile conditions. Hair on the abdomen was clipped.  He was secured to the operating table. The abdomen was then prepped and draped in the standard sterile fashion. Surgical timeout was called indicating the correct patient, procedure, positioning and need for preoperative antibiotics.   An OG tube was placed by anesthesia and confirmed to be to suction.  At Palmer's point, a stab incision was created and the Veress needle was introduced into the peritoneal cavity on the first attempt.  Intraperitoneal location was confirmed by the aspiration and saline drop test.  Pneumoperitoneum was established to a maximum pressure of 15 mmHg using CO2.  Following this, the abdomen was marked for planned trocar sites.  Just to the right and cephalad to the umbilicus, an 8 mm incision was created and an 8 mm blunt tipped robotic trocar was cautiously placed into the peritoneal cavity.  The laparoscope was inserted and demonstrated no evidence of trocar site nor Veress needle site complications.  The Veress needle was removed.  Bilateral transversus abdominis plane blocks were then created using a dilute mixture of Exparel with Marcaine.  3 additional 8 mm robotic trochars were placed under direct visualization roughly in a line extending from the right ASIS towards the left upper quadrant. The bladder was inspected and noted to be at/below the pubic symphysis.  Staying 3 fingerbreadths above the pubic symphysis, an incision was created and the 12 mm robotic trocar inserted directed cephalad into the peritoneal cavity under direct visualization.  An additional 5 mm assist port was placed in the right lateral abdomen under direct visualization.  The abdomen was surveyed and there was no adhesions.  He was positioned in Trendelenburg with the left side tilted slightly up.  Small bowel was carefully retracted out of the pelvis.  The robot was then docked and  I went to the console.   The rectum was inspected.  There is no obvious  mass but we are able to identify tattoo going all the way down to the level of the peritoneal reflection.  The sigmoid colon was readily identified.  Attachments of the sigmoid colon were taken down from the intersigmoid fossa.  The rectosigmoid colon was grasped and elevated anteriorly.  Beginning with a medial to lateral approach, the peritoneum overlying the presacral space was carefully incised.  We are able to identify the right ureter through the peritoneum well lateral to this location.  The TME plane was readily gained working in a plane between the fascia propria of the rectum and the presacral fascia.  Hypogastric nerves were seen going along the the presacral fascia and were protected free of injury.  Working more proximally, the mesorectum and sigmoid mesentery was carefully mobilized off of the peritoneum.  The left ureter was identified and protected free of injury.  The left gonadal vessels were identified and protected.  These were both swept "down."  The superior hemorrhoidal and IMA pedicles were identified. Further mesocolon was mobilized proximally staying in this plane between the retroperitoneum proper and the mesocolon. Attention was then turned to the lateral portion of dissection.  The sigmoid colon was then retracted to the right.  The sigmoid colon was fully mobilized. The descending colon was mobilized by incising the Jaielle Dlouhy line of Toldt.  This was done all the way up to the level of the splenic flexure.  We then opted to proceed with a diagnostic flexible sigmoidoscopy to confirm the location of the lesion in particular its orientation to the tattoo.  The sigmoid colon is gently occluded with a bowel grasper.  I then went below to pass a flexible sigmoidoscope.  Under direct visualization this was inserted through the anal canal and into the rectum.  The quality of the tissue than the rectum is healthy and normal in appearance.  The sigmoidoscope was advanced up into the mid sigmoid  colon and then carefully withdrawn.  Were able to identify on the first valve of [name] mucosal thickening and villous like changes and a subtle mass at this location.  The tattoo is identified at least 3 to 4 cm distal to this.  The rest of the rectum is normal in appearance that any evident adenomatous type changes.  The sigmoidoscope was withdrawn.  I went back to the console. The associated descending mesocolon was also mobilized medially.  The left ureter again was confirmed to be well away from the vasculature which had been dissected medially.  The rectosigmoid colon was elevated anteriorly. The left ureter was re-identified. The IMA was clear of this. The IMA was then divided with the vessel sealer. The stump was inspected and noted to be completely hemostatic with a good seal.  The mesentery was divided out to the point of planned proximal division.  We then continued our TME dissection.  The plane between the fascia propria of the rectum and the presacral fascia is maintained working posteriorly first.  The hypogastric nerves are seen and are able to be protected and swept "down."  This dissection is carried all the way into the deep pelvis below the level of the tattoo and approximately at the level of the peritoneal reflection.  We then continued this plane laterally on both the right and left sides and terminated this dissection anteriorly.  The distal extent of the anterior dissection is just at/above the peritoneal reflection.  This is just below the tattoo.  This will give [place] an approximate 5 cm distal margin on this mass for this proximal rectal cancer.  NAT any specific manner, and the mesorectum at the level of the planned point of distal transection is circumferentially cleared.  This was done using the vessel sealer.  The distal point of transection on the rectum was identified.   A 60 mm green load robotic stapler was then placed through the 12 mm port and introduced into the peritoneal  cavity.  The rectum was divided with two firings of the stapler.  The stump was intact and healthy in appearance with well-formed staples.  The proximal point of transection is identified on the sigmoid colon which at this location easily reaches into the deep pelvis.  The IMA stump is grasped and we are able to include the IMA pedicle and the division of the mesentery going out to the planned point of proximal transection.  This was done with the vessel sealer.   Attention was turned to performing a perfusion test. ICG was administered by anesthesia and at the level of the cleared mesentery proximally, there was excellent uptake of the tracer.  The rectum was also well perfused in appearance up to the level of cleared mesorectum.  The sigmoid colon is supple and healthy in appearance without any thickening.  This reached into the pelvis without any difficulty and remained in that location without any tension. A locking grasper was then placed on the proximal rectal staple line.   Attention was turned to the extracorporeal portion of the procedure.  The robot was undocked.  I scrubbed back in.  Using the 12 mm trocar site, a Pfannenstiel incision was created and incorporated the fascial opening through the 12 mm port site.  The rectus fascia was incised and then elevated.  The rectus muscle was mobilized free of the overlying fascia.  The peritoneum was incised in the midline well above the location of the bladder.  An Alexis wound protector was placed.  Towels were placed around the field.  The divided colon was passed through the wound protector.  The point of proximal division was identified and was again on a healthy segment of supple colon with a palpable pulse in the mesentery. This was pink in color.  A pursestring device was applied.  A 2-0 Prolene on a Keith needle was passed.  The colon was divided and passed off with the open end being proximal.  EEA sizers were then introduced and a 31 mm EEA  selected.  "Belt loops" consisting of 3-0 silk were placed around the pursestring suture line.  The anvil was placed and the pursestring tied.   A small amount of fat was cleared from the planned anastomosis and no diverticula were apparent within this.  This was placed back into the abdomen and a cap placed over the wound protector port site.  Pneumoperitoneum was reestablished.  I then went below to pass the stapler.  My partner remained above.  EEA sizers were cautiously introduced via the anus and advanced under direct visualization.  The stapler was passed and the spike deployed just anterior to the staple line.  The components were then mated.  Orientation was confirmed such that there is no twisting of the colon nor small bowel underneath the mesenteric defect. Care was taken to ensure no other structures were incorporated within this either.  The stapler was then closed, held, and fired. This was then removed. The donuts  were inspected and noted to be complete.  The distal anastomotic donut was included as a separate specimen.  The colon proximal to the anastomosis was then gently occluded. The pelvis was filled with sterile irrigation. Under direct visualization, I passed a flexible sigmoidoscope.  The anastomosis was under water.  With good distention of the anastomosis there was no air leak. The anastomosis pink in appearance.  This is located at 8 cm from the anal verge by flexible sigmoidoscopy.  It is hemostatic.  Additionally, looking from above, there is no tension on the colon or mesentery.  Sigmoidoscope was withdrawn.  Irrigation was evacuated from the pelvis.  The abdomen and pelvis are surveyed and noted to be completely hemostatic without any apparent injury.   I scrubbed back in.  Under laparoscopic guidance, were able to identify the cecum and terminal ileum.  We traced this back about 20 cm.  This is where we plan to create our ileostomy.  The site previously marked in the right side  cephalad the umbilicus is chosen for ileostomy site.  A wheal of skin is excised and the underlying subcutaneous tissue divided electrocautery.  The fascia is incised in a cruciate manner.  The underlying rectus muscle was spread and we created an os that snugly fit 2 fingers.  Orientation is maintained and the distal end is passed through this.  We then looked laparoscopically to confirm that there is no twisting of the mesentery.  Under direct visualization, all trochars are removed.  The Alexis wound protector was removed.  Gowns/gloves are changed and a fresh set of clean instruments utilized. Additional sterile drapes were placed around the field.   The Pfannenstiel peritoneum was closed with a running 2-0 Vicryl suture.  The rectus fascia was then closed using 2 running #1 PDS sutures.  The fascia was then palpated and noted to be completely closed.  Additional anesthetic was infiltrated at the Pfannenstiel site.  Sponge, needle, and instrument counts were reported correct x2. 4-0 Monocryl subcuticular suture was used to close the skin of all incision sites.  Dermabond was placed over all incisions  We then began with maturing the ileostomy.  The ileum reaches up to the skin without any trouble and therefore no stoma rod is used.  The loop ileostomy is matured in a partial Brooking manner using 3-0 Vicryl suture.  It is pink.  It is gently digitized and found to be widely patent down to and below the level of the fascia.  A ostomy appliance is then cut to fit.   He was then taken out of lithotomy, awakened from anesthesia, extubated, and transferred to a stretcher for transport to PACU in satisfactory condition having tolerated the procedure well.

## 2023-02-17 NOTE — Anesthesia Preprocedure Evaluation (Signed)
Anesthesia Evaluation  Patient identified by MRN, date of birth, ID band Patient awake    Reviewed: Allergy & Precautions, H&P , NPO status , Patient's Chart, lab work & pertinent test results  Airway Mallampati: II  TM Distance: >3 FB Neck ROM: Full    Dental no notable dental hx.    Pulmonary neg pulmonary ROS, former smoker   Pulmonary exam normal breath sounds clear to auscultation       Cardiovascular negative cardio ROS Normal cardiovascular exam Rhythm:Regular Rate:Normal     Neuro/Psych negative neurological ROS  negative psych ROS   GI/Hepatic negative GI ROS, Neg liver ROS,,,  Endo/Other  negative endocrine ROS    Renal/GU negative Renal ROS  negative genitourinary   Musculoskeletal negative musculoskeletal ROS (+)    Abdominal   Peds negative pediatric ROS (+)  Hematology negative hematology ROS (+)   Anesthesia Other Findings   Reproductive/Obstetrics negative OB ROS                             Anesthesia Physical Anesthesia Plan  ASA: 2  Anesthesia Plan: General   Post-op Pain Management: Tylenol PO (pre-op)* and Toradol IV (intra-op)*   Induction: Intravenous  PONV Risk Score and Plan: 2 and Ondansetron, Dexamethasone, Treatment may vary due to age or medical condition and Midazolam  Airway Management Planned: Oral ETT  Additional Equipment:   Intra-op Plan:   Post-operative Plan: Extubation in OR  Informed Consent: I have reviewed the patients History and Physical, chart, labs and discussed the procedure including the risks, benefits and alternatives for the proposed anesthesia with the patient or authorized representative who has indicated his/her understanding and acceptance.     Dental advisory given  Plan Discussed with: CRNA and Surgeon  Anesthesia Plan Comments:        Anesthesia Quick Evaluation

## 2023-02-17 NOTE — Anesthesia Postprocedure Evaluation (Signed)
Anesthesia Post Note  Patient: Troy Dillon  Procedure(s) Performed: XI ROBOTIC ASSISTED LOWER ANTERIOR RESECTION, DIVERTING LOOP ILEOSTOMY, BILATERAL TAP BLOCK, TISSUE PERFUSION ASSESSMENT VIA FIREFLY INJECTION (Bilateral) FLEXIBLE SIGMOIDOSCOPY     Patient location during evaluation: PACU Anesthesia Type: General Level of consciousness: awake and alert Pain management: pain level controlled Vital Signs Assessment: post-procedure vital signs reviewed and stable Respiratory status: spontaneous breathing, nonlabored ventilation, respiratory function stable and patient connected to nasal cannula oxygen Cardiovascular status: blood pressure returned to baseline and stable Postop Assessment: no apparent nausea or vomiting Anesthetic complications: no  No notable events documented.  Last Vitals:  Vitals:   02/17/23 1323 02/17/23 1432  BP: 114/80 112/81  Pulse: 64 (!) 58  Resp: 18 16  Temp: 37 C (!) 36.3 C  SpO2: 100% 100%    Last Pain:  Vitals:   02/17/23 1432  TempSrc: Oral  PainSc:                  [name] S

## 2023-02-17 NOTE — Consult Note (Signed)
WOC Nurse ostomy consult note Consult received for new loop ileostomy. WOC Nurse to see patient tomorrow, 02/18/23.  WOC nursing team will follow, and will remain available to this patient, the nursing and medical teams.    Thank you for inviting [place] to participate in this patient's Plan of Care.  Ladona Mow, MSN, RN, CNS, GNP, Leda Min, [employer], [employer] phone:  [phone]

## 2023-02-18 ENCOUNTER — Other Ambulatory Visit (HOSPITAL_COMMUNITY): Payer: Self-pay

## 2023-02-18 ENCOUNTER — Encounter (HOSPITAL_COMMUNITY): Payer: Self-pay | Admitting: Surgery

## 2023-02-18 LAB — BASIC METABOLIC PANEL
Anion gap: 6 (ref 5–15)
BUN: 13 mg/dL (ref 6–20)
CO2: 24 mmol/L (ref 22–32)
Calcium: 8.2 mg/dL — ABNORMAL LOW (ref 8.9–10.3)
Chloride: 105 mmol/L (ref 98–111)
Creatinine, Ser: 1.1 mg/dL (ref 0.61–1.24)
GFR, Estimated: >60 mL/min (ref >60)
Glucose, Bld: 115 mg/dL — ABNORMAL HIGH (ref 70–99)
Potassium: 4.3 mmol/L (ref 3.5–5.1)
Sodium: 135 mmol/L (ref 135–145)

## 2023-02-18 LAB — CBC
HCT: 38.1 % — ABNORMAL LOW (ref 39.0–52.0)
Hemoglobin: 13 g/dL (ref 13.0–17.0)
MCH: 33 pg (ref 26.0–34.0)
MCHC: 34.1 g/dL (ref 30.0–36.0)
MCV: 96.7 fL (ref 80.0–100.0)
Platelets: 153 10*3/uL (ref 150–400)
RBC: 3.94 MIL/uL — ABNORMAL LOW (ref 4.22–5.81)
RDW: 12.2 % (ref 11.5–15.5)
WBC: 7.3 10*3/uL (ref 4.0–10.5)
nRBC: 0 % (ref 0.0–0.2)

## 2023-02-18 MED ORDER — CALCIUM POLYCARBOPHIL 625 MG PO TABS
625.0000 mg | ORAL_TABLET | Freq: Two times a day (BID) | ORAL | Status: DC
  Administered 2023-02-18 – 2023-02-20 (×5): 625 mg via ORAL
  Filled 2023-02-18 (×5): qty 1

## 2023-02-18 NOTE — Progress Notes (Signed)
  Subjective No acute events. Feeling well. Up walking yesterday. No n/v. Tolerating liquids without trouble. Ileostomy productive. No significant abdominal pain  Objective: Vital signs in last 24 hours: Temp:  [97.3 F (36.3 C)-98.6 F (37 C)] 97.8 F (36.6 C) (06/27 0452) Pulse Rate:  [58-82] 66 (06/27 0452) Resp:  [9-18] 18 (06/27 0452) BP: (99-124)/(65-83) 100/65 (06/27 0452) SpO2:  [97 %-100 %] 97 % (06/27 0452) Last BM Date : 02/16/23  Intake/Output from previous day: 06/26 0701 - 06/27 0700 In: 4516.2 [P.O.:480; I.V.:3936.2; IV Piggyback:100] Out: 2400 [Urine:1950; Stool:350; Blood:100] Intake/Output this shift: No intake/output data recorded.  Gen: NAD, comfortable CV: RRR Pulm: Normal work of breathing Abd: Soft, appropriate incisional tenderness, nondistended. Incisions c/d/I. Ileostomy with expected mild postop edema, pink productive. Ext: SCDs in place  Lab Results: CBC  Recent Labs    02/18/23 0448  WBC 7.3  HGB 13.0  HCT 38.1*  PLT 153   BMET Recent Labs    02/18/23 0448  NA 135  K 4.3  CL 105  CO2 24  GLUCOSE 115*  BUN 13  CREATININE 1.10  CALCIUM 8.2*   PT/INR No results for input(s): "LABPROT", "INR" in the last 72 hours. ABG No results for input(s): "PHART", "HCO3" in the last 72 hours.  Invalid input(s): "PCO2", "PO2"  Studies/Results:  Anti-infectives: Anti-infectives (From admission, onward)    Start     Dose/Rate Route Frequency Ordered Stop   02/17/23 1400  neomycin (MYCIFRADIN) tablet 1,000 mg  Status:  Discontinued       See Hyperspace for full Linked Orders Report.   1,000 mg Oral 3 times per day 02/17/23 1610 02/17/23 0635   02/17/23 1400  metroNIDAZOLE (FLAGYL) tablet 1,000 mg  Status:  Discontinued       See Hyperspace for full Linked Orders Report.   1,000 mg Oral 3 times per day 02/17/23 9604 02/17/23 0635   02/17/23 0645  cefoTEtan (CEFOTAN) 2 g in sodium chloride 0.9 % 100 mL IVPB        2 g 200 mL/hr over 30  Minutes Intravenous On call to O.R. 02/17/23 5409 02/17/23 0853        Assessment/Plan: Patient Active Problem List   Diagnosis Date Noted   Rectal cancer (HCC) 02/17/2023   Genetic testing 09/03/2022   Family history of colon cancer 08/20/2022   Port-A-Cath in place 06/25/2022   Rectal carcinoma (HCC) 06/17/2022   s/p Procedure(s): XI ROBOTIC ASSISTED LOWER ANTERIOR RESECTION, DIVERTING LOOP ILEOSTOMY, BILATERAL TAP BLOCK, TISSUE PERFUSION ASSESSMENT VIA FIREFLY INJECTION FLEXIBLE SIGMOIDOSCOPY 02/17/2023  -Ambulate 5x/day -Diet as tolerated -Cont MIVF @ 50 cc/hr while monitoring ileostomy output -Fibercon tabs bid -WOCN following -Home health consult for bridge to home -D/C Entereg, D/C foley -Ppx: SQH, SCDs  We spent time this morning reviewing his procedure, findings, and plans moving forward. Discussed ileostomy, general goals, consistency ~toothpaste and 24 hr volumes <1.2 L.    LOS: 1 day   Marin Olp, MD [facility], A [facility]

## 2023-02-18 NOTE — Consult Note (Addendum)
WOC Nurse ostomy consult note; patient with loop ileostomy placed by Dr. Cliffton Asters 6/26 Stoma type/location: RLQ loop ileostomy  Stomal assessment/size: 1 1/2" edematous, red moist, round  Peristomal assessment: intact  Treatment options for stomal/peristomal skin: 2" barrier ring  Output approximately 100 mls brown liquid in pouch  Ostomy pouching: 2 piece 2 1/4" skin barrier [place] [phone]), 2 1/4" pouch [place] [phone]) and 2" barrier ring [place] [phone])  Education provided: Educated patient and wife on the need to empty ostomy pouch when 1/3 to 1/2 full.  We discussed consistency of ileostomy stool, stool liquid at this point.  We discussed dangers of high output of liquid stool and ways to prevent dehydration as well as when to contact surgeon.  Discussed that stool should thicken up as the body adjusts.  Also discussed food boluses and ways to prevent this.  We discussed showering with pouch on or off if it is the day to change his pouching system. Discussed changing entire pouching system 2 times a week and immediately if leaking to avoid skin breakdown.  Demonstrated to wife emptying of pouch and cleaning with toilet paper wick. Wife was able to return demonstration.  We discussed that pouches are odor proof and that pouches at home will have charcoal filter.  We discussed and demonstrated how to "burp" current 2 piece pouching system.  Demonstrated to wife how to remove pouch with push pull technique and wife was able to do this independently.  Discussed cleaning around stoma with water moistened washcloth only and avoiding baby wipes, soaps or lotions which may hinder adhesion of skin barrier.  Sized stoma at this visit at 1 1/2", we discussed current edema in abdomen and stoma and that this will subside over next 2-4 weeks.  Encouraged patient and wife to size stoma with each pouch change for the next month.  Wife was able to independently cut new skin barrier at 1 1/2".  Wife stretched skin barrier ring  to fit snugly around stoma. Wife snapped pouch onto new skin barrier and placed on top of barrier ring.  Wife ran finger around inner seal of skin barrier to enhance adhesion.    Did provide patient and wife stoma powder and no sting barrier wipes with instructions on how to use this if skin around stoma breaks down.   Reviewed educational handouts that were provided for patient and family today.  Did go over the 2 piece pouching system handout that provides step by step instruction on performing this.  Also reviewed educational videos for patient to watch.  Patient has already been watching you tube videos regarding care of ostomy at home and appears to be knowledgeable.  Patient and wife appear confident and will work as a team to care for ostomy.  I did discuss ostomy clinic and provided a handout for the clinic if this is needed in the future.    Ordered supplies for room including (5)  2 1/4" skin barriers, (5) 2 1/4" pouches, and (5) 2" barrier rings   Enrolled patient in [employer] DC program: Yes Also provided patient with [employer] with items marked if he decides to order supplies through them.    WOC team will continue to follow for ostomy care and support as long as remains inpatient.   Thank you,    Priscella Mann MSN, RN-BC, [employer] [phone]

## 2023-02-18 NOTE — TOC Initial Note (Addendum)
Transition of Care [facility]) - Initial/Assessment Note   Patient Details  Name: Troy Dillon MRN: [id number] Date of Birth: [birth year]  [facility]) CM/SW Contact:    Ewing Schlein, LCSW Phone Number: 02/18/2023, 3:11 PM  Clinical Narrative: Patient has a new ostomy and will need HHRN at discharge. CSW made referrals to the following [facility] agencies:  [facility]: declined due to lack of nursing availability [facility]: declined [facility]: out of network [facility]: out of service area [facility]: out of network [facility]: under review  Addendum: [facility] declined referral due to lack of nursing staffing.  Expected Discharge Plan: Home w Home Health Services Barriers to Discharge: No Home Care Agency will accept this patient, Continued Medical Work up  Expected Discharge Plan and Services In-house Referral: Clinical Social Work Post Acute Care Choice: Home Health Living arrangements for the past 2 months: Single Family Home            DME Arranged: N/A DME Agency: NA  Prior Living Arrangements/Services Living arrangements for the past 2 months: Single Family Home Lives with:: Spouse Patient language and need for interpreter reviewed:: Yes Need for Family Participation in Patient Care: No (Comment) Care giver support system in place?: Yes (comment) Criminal Activity/Legal Involvement Pertinent to Current Situation/Hospitalization: No - Comment as needed  Activities of Daily Living Home Assistive Devices/Equipment: None ADL Screening (condition at time of admission) Patient's cognitive ability adequate to safely complete daily activities?: Yes Is the patient deaf or have difficulty hearing?: No Does the patient have difficulty seeing, even when wearing glasses/contacts?: No Does the patient have difficulty concentrating, remembering, or making decisions?: No Patient able to express need for assistance with ADLs?: Yes Does the patient have difficulty dressing or bathing?:  No Independently performs ADLs?: Yes (appropriate for developmental age) Does the patient have difficulty walking or climbing stairs?: No Weakness of Legs: None Weakness of Arms/Hands: Both  Emotional Assessment Orientation: : Oriented to Self, Oriented to Place, Oriented to  Time, Oriented to Situation Alcohol / Substance Use: Not Applicable Psych Involvement: No (comment)  Admission diagnosis:  Rectal cancer (HCC) [C20] Patient Active Problem List   Diagnosis Date Noted   Rectal cancer (HCC) 02/17/2023   Genetic testing 09/03/2022   Family history of colon cancer 08/20/2022   Port-A-Cath in place 06/25/2022   Rectal carcinoma (HCC) 06/17/2022   PCP:  Practice, [facility]:   [id number] [place] [place] - [address] [place] [address] [place] [address] Phone: [phone] Fax: [phone]  [facility] [address] [place] [address] Phone: [phone] Fax: [phone]  Social Determinants of Health (SDOH) Social History: SDOH Screenings   Food Insecurity: No Food Insecurity (02/17/2023)  Housing: Low Risk  (02/17/2023)  Transportation Needs: No Transportation Needs (02/17/2023)  Utilities: Not At Risk (02/17/2023)  Tobacco Use: Medium Risk (02/18/2023)   SDOH Interventions:    Readmission Risk Interventions     No data to display

## 2023-02-19 ENCOUNTER — Other Ambulatory Visit: Payer: Self-pay

## 2023-02-19 LAB — CBC
HCT: 39.4 % (ref 39.0–52.0)
Hemoglobin: 13.3 g/dL (ref 13.0–17.0)
MCH: 33 pg (ref 26.0–34.0)
MCHC: 33.8 g/dL (ref 30.0–36.0)
MCV: 97.8 fL (ref 80.0–100.0)
Platelets: 145 10*3/uL — ABNORMAL LOW (ref 150–400)
RBC: 4.03 MIL/uL — ABNORMAL LOW (ref 4.22–5.81)
RDW: 12.3 % (ref 11.5–15.5)
WBC: 5.3 10*3/uL (ref 4.0–10.5)
nRBC: 0 % (ref 0.0–0.2)

## 2023-02-19 LAB — BASIC METABOLIC PANEL
Anion gap: 6 (ref 5–15)
BUN: 15 mg/dL (ref 6–20)
CO2: 25 mmol/L (ref 22–32)
Calcium: 8.7 mg/dL — ABNORMAL LOW (ref 8.9–10.3)
Chloride: 104 mmol/L (ref 98–111)
Creatinine, Ser: 1.11 mg/dL (ref 0.61–1.24)
GFR, Estimated: >60 mL/min (ref >60)
Glucose, Bld: 98 mg/dL (ref 70–99)
Potassium: 3.9 mmol/L (ref 3.5–5.1)
Sodium: 135 mmol/L (ref 135–145)

## 2023-02-19 MED ORDER — LOPERAMIDE HCL 2 MG PO CAPS
2.0000 mg | ORAL_CAPSULE | Freq: Two times a day (BID) | ORAL | Status: DC
  Administered 2023-02-19 (×2): 2 mg via ORAL
  Filled 2023-02-19 (×2): qty 1

## 2023-02-19 MED ORDER — LACTATED RINGERS IV SOLN: INTRAVENOUS | Status: DC

## 2023-02-19 NOTE — Consult Note (Signed)
WOC Nurse ostomy follow up; second teaching session with patient and wife Stoma type/location:  RLQ ileostomy  Stomal assessment/size: 1 1/2" pink moist, edematous, well budded  Peristomal assessment: intact  Treatment options for stomal/peristomal skin: 2" barrier ring  Output  approximately 50 mls liquid brown effluent  Ostomy pouching: 2 piece 2 1/4" skin barrier, 2 1/4" pouch and 2" barrier ring  Education provided:  At this visit wife independently changed pouching system to include removing old pouch, cleaning around stoma with water moistened washcloth and drying area.  Skin intact.  Wife stretched a 2" barrier ring to fit around stoma and cut new skin barrier at 1 1/2".  Wife was able to snap pouch onto new skin barrier and place on top of 2" ring.    Dr. Cliffton Asters in at the time of this visit and discussed high output that is currently going on. I have included 2 high output pouches for patient to take home as well as multiple 2 1/4" skin barriers, pouches and barrier rings. Also did discuss ostomy clinic with patient and wife again as they will be unable to receive any home health.   All questions answered.  Patient and wife have been emptying pouch as needed and feel comfortable with this.    Enrolled in [employer]: yes  WOC team will continue to follow for ostomy education and support as long as remains inpatient.    Thank you,     Priscella Mann MSN, RN-BC, [employer] [phone]

## 2023-02-19 NOTE — TOC Progression Note (Signed)
Transition of Care [name]) - Progression Note   Patient Details  Name: Troy Dillon MRN: [id number] Date of Birth: 1971-03-15  [name] CM/SW Contact  Ewing Schlein, LCSW Phone Number: 02/19/2023, 1:00 PM  Clinical Narrative: CSW made [name] referrals to the remaining agencies:  [facility]: out of network [facility]: declined [facility]: declined due to lack of staffing  No home health agency accepted the referral for [name] for the new ileostomy. CSW sent message with update to surgeon.  Expected Discharge Plan: Home w Home Health Services Barriers to Discharge: No Home Care Agency will accept this patient, Continued Medical Work up  Expected Discharge Plan and Services In-house Referral: Clinical Social Work Post Acute Care Choice: Home Health Living arrangements for the past 2 months: Single Family Home           DME Arranged: N/A DME Agency: NA  Social Determinants of Health (SDOH) Interventions SDOH Screenings   Food Insecurity: No Food Insecurity (02/17/2023)  Housing: Low Risk  (02/17/2023)  Transportation Needs: No Transportation Needs (02/17/2023)  Utilities: Not At Risk (02/17/2023)  Tobacco Use: Medium Risk (02/18/2023)   Readmission Risk Interventions     No data to display

## 2023-02-19 NOTE — Progress Notes (Signed)
  Subjective No acute events. Feeling well. No n/v. Tolerating soft diet. Ileostomy productive. No significant abdominal pain  Objective: Vital signs in last 24 hours: Temp:  [97.7 F (36.5 C)-98.6 F (37 C)] 98 F (36.7 C) (06/28 1403) Pulse Rate:  [60-69] 63 (06/28 1403) Resp:  [18] 18 (06/28 1403) BP: (102-116)/(66-75) 116/75 (06/28 1403) SpO2:  [98 %-99 %] 98 % (06/28 1403) Last BM Date : 02/16/23  Intake/Output from previous day: 06/27 0701 - 06/28 0700 In: 1257.2 [P.O.:960; I.V.:297.2] Out: 3080 [Urine:1180; Stool:1900] Intake/Output this shift: Total I/O In: 240 [P.O.:240] Out: 1050 [Urine:400; Stool:650]  Gen: NAD, comfortable CV: RRR Pulm: Normal work of breathing Abd: Soft, appropriate incisional tenderness, nondistended. Incisions c/d/I. Ileostomy pink productive. Ext: SCDs in place  Lab Results: CBC  Recent Labs    02/18/23 0448 02/19/23 0430  WBC 7.3 5.3  HGB 13.0 13.3  HCT 38.1* 39.4  PLT 153 145*   BMET Recent Labs    02/18/23 0448 02/19/23 0430  NA 135 135  K 4.3 3.9  CL 105 104  CO2 24 25  GLUCOSE 115* 98  BUN 13 15  CREATININE 1.10 1.11  CALCIUM 8.2* 8.7*   PT/INR No results for input(s): "LABPROT", "INR" in the last 72 hours. ABG No results for input(s): "PHART", "HCO3" in the last 72 hours.  Invalid input(s): "PCO2", "PO2"  Studies/Results:  Anti-infectives: Anti-infectives (From admission, onward)    Start     Dose/Rate Route Frequency Ordered Stop   02/17/23 1400  neomycin (MYCIFRADIN) tablet 1,000 mg  Status:  Discontinued       See Hyperspace for full Linked Orders Report.   1,000 mg Oral 3 times per day 02/17/23 4098 02/17/23 0635   02/17/23 1400  metroNIDAZOLE (FLAGYL) tablet 1,000 mg  Status:  Discontinued       See Hyperspace for full Linked Orders Report.   1,000 mg Oral 3 times per day 02/17/23 1191 02/17/23 0635   02/17/23 0645  cefoTEtan (CEFOTAN) 2 g in sodium chloride 0.9 % 100 mL IVPB        2 g 200 mL/hr  over 30 Minutes Intravenous On call to O.R. 02/17/23 0633 02/17/23 0853        Assessment/Plan: Patient Active Problem List   Diagnosis Date Noted   Rectal cancer (HCC) 02/17/2023   Genetic testing 09/03/2022   Family history of colon cancer 08/20/2022   Port-A-Cath in place 06/25/2022   Rectal carcinoma (HCC) 06/17/2022   s/p Procedure(s): XI ROBOTIC ASSISTED LOWER ANTERIOR RESECTION, DIVERTING LOOP ILEOSTOMY, BILATERAL TAP BLOCK, TISSUE PERFUSION ASSESSMENT VIA FIREFLY INJECTION FLEXIBLE SIGMOIDOSCOPY 02/17/2023  -Ambulate 5x/day -Diet as tolerated -Cont MIVF @ 50 cc/hr while monitoring ileostomy output -Fibercon tabs bid -Imodium 2 mg BID to start given 1.9L/24hr - if continues tomorrow, will increase to 2 mg TID -WOCN following -Was not candidate for [facility] per our Child psychotherapist - 3 agencies declined. Will plan for WOCN clinic as outpatient as well though -Ppx: SQH, SCDs  We spent time this morning reviewing his procedure, findings, and plans moving forward. Discussed ileostomy, general goals, consistency ~toothpaste and 24 hr volumes <1.2 L.    LOS: 2 days   Marin Olp, MD [facility] Surgery, A [facility]

## 2023-02-20 ENCOUNTER — Encounter: Payer: Self-pay | Admitting: Hematology

## 2023-02-20 ENCOUNTER — Other Ambulatory Visit (HOSPITAL_COMMUNITY): Payer: Self-pay

## 2023-02-20 LAB — CBC
HCT: 39.8 % (ref 39.0–52.0)
Hemoglobin: 13.8 g/dL (ref 13.0–17.0)
MCH: 33.2 pg (ref 26.0–34.0)
MCHC: 34.7 g/dL (ref 30.0–36.0)
MCV: 95.7 fL (ref 80.0–100.0)
Platelets: 141 10*3/uL — ABNORMAL LOW (ref 150–400)
RBC: 4.16 MIL/uL — ABNORMAL LOW (ref 4.22–5.81)
RDW: 12.1 % (ref 11.5–15.5)
WBC: 5.1 10*3/uL (ref 4.0–10.5)
nRBC: 0 % (ref 0.0–0.2)

## 2023-02-20 LAB — BASIC METABOLIC PANEL
Anion gap: 6 (ref 5–15)
BUN: 16 mg/dL (ref 6–20)
CO2: 23 mmol/L (ref 22–32)
Calcium: 8.4 mg/dL — ABNORMAL LOW (ref 8.9–10.3)
Chloride: 104 mmol/L (ref 98–111)
Creatinine, Ser: 0.93 mg/dL (ref 0.61–1.24)
GFR, Estimated: >60 mL/min (ref >60)
Glucose, Bld: 97 mg/dL (ref 70–99)
Potassium: 3.8 mmol/L (ref 3.5–5.1)
Sodium: 133 mmol/L — ABNORMAL LOW (ref 135–145)

## 2023-02-20 MED ORDER — LOPERAMIDE HCL 2 MG PO CAPS
2.0000 mg | ORAL_CAPSULE | Freq: Three times a day (TID) | ORAL | 0 refills | Status: DC
  Filled 2023-02-20: qty 30, 10d supply, fill #0

## 2023-02-20 MED ORDER — LOPERAMIDE HCL 2 MG PO CAPS
2.0000 mg | ORAL_CAPSULE | Freq: Three times a day (TID) | ORAL | Status: DC
  Administered 2023-02-20: 2 mg via ORAL
  Filled 2023-02-20: qty 1

## 2023-02-20 NOTE — Discharge Summary (Signed)
Physician Discharge Summary  Patient ID: Troy Dillon MRN: [id number] DOB/AGE: [birth year] 52 y.o.  Admit date: 02/17/2023 Discharge date: 02/20/2023  Admission Diagnoses:  Rectal adenocarcinoma  Discharge Diagnoses: Same Principal Problem:   Rectal cancer [facility])   Discharged Condition: good  Hospital Course: Robotic low anterior resection with diverting loop ileostomy 02/17/23.  2/22 lymph nodes, margins negative, T3N1b.  His ileostomy output initially was quite thin, but has thickened in consistency and decreased in volume with Imodium.  Pain well-controlled. Regular diet.  Consults:  Ostomy care   Treatments: surgery: LAR/DLI  Discharge Exam: Blood pressure 113/79, pulse 64, temperature 97.7 F (36.5 C), temperature source Oral, resp. rate 18, height 5\' 11"  (1.803 m), weight 83.4 kg, SpO2 98 %. Gen: NAD, comfortable CV: RRR Pulm: Normal work of breathing Abd: Soft, appropriate incisional tenderness, nondistended. Incisions c/d/I. Ileostomy pink productive.  Disposition: Discharge disposition: 01-Home or Self Care       Discharge Instructions     Call MD for:  persistant nausea and vomiting   Complete by: As directed    Call MD for:  redness, tenderness, or signs of infection (pain, swelling, redness, odor or green/yellow discharge around incision site)   Complete by: As directed    Call MD for:  severe uncontrolled pain   Complete by: As directed    Call MD for:  temperature >100.4   Complete by: As directed    Diet general   Complete by: As directed    Driving Restrictions   Complete by: As directed    Do not drive while taking pain medications   Increase activity slowly   Complete by: As directed    May shower / Bathe   Complete by: As directed       Allergies as of 02/20/2023       Reactions   Hydrocodone Itching   Latex    Eye irritation         Medication List     STOP taking these medications    capecitabine 500 MG tablet Commonly known  as: Xeloda   FLUOROURACIL IV   LEUCOVORIN CALCIUM IV   lidocaine-prilocaine cream Commonly known as: EMLA   megestrol 400 MG/10ML suspension Commonly known as: MEGACE   ondansetron 8 MG tablet Commonly known as: ZOFRAN   OXALIPLATIN IV   prochlorperazine 10 MG tablet Commonly known as: COMPAZINE   promethazine 25 MG suppository Commonly known as: PHENERGAN       TAKE these medications    dextrose 5 % SOLN 1,000 mL with fluorouracil 5 GM/100ML SOLN Inject into the vein over 48 hr. Every 14 days   escitalopram 10 MG tablet Commonly known as: LEXAPRO Take 1 tablet (10 mg total) by mouth daily.   gabapentin 300 MG capsule Commonly known as: NEURONTIN Take 1 capsule (300 mg total) by mouth 3 (three) times daily.   loperamide 2 MG capsule Commonly known as: IMODIUM Take 1 capsule (2 mg total) by mouth 3 (three) times daily.   traMADol 50 MG tablet Commonly known as: Ultram Take 1 tablet (50 mg) by mouth every 6 hours as needed for up to 5 days (postop pain not controlled with tylenol/ibuprofen first).        Follow-up Information     Andria Meuse, MD Follow up on 03/17/2023.   Specialties: General Surgery, Colon and Rectal Surgery Why: Please arrive by 9:15 am Contact information: [address] [place] [address] [place] [address] [phone]  Signed: Wynona Luna 02/20/2023, 7:57 AM

## 2023-02-22 ENCOUNTER — Ambulatory Visit
Admission: RE | Admit: 2023-02-22 | Discharge: 2023-02-22 | Disposition: A | Discharge status: Home / Self Care | Payer: No Typology Code available for payment source | Source: Ambulatory Visit | Attending: Radiation Oncology | Admitting: Radiation Oncology

## 2023-02-22 NOTE — Progress Notes (Signed)
  Radiation Oncology         [phone]) [phone] ________________________________  Name: Troy Dillon MRN: [id number]  Date of Service: 02/22/2023  DOB: 1970/10/05  Post Treatment Telephone Note  Diagnosis:   Stage IIIB, cT3N1M0, adenocarcinoma of the proximal rectum (as documented in provider EOT note)   The patient was available for call today.   Symptoms of fatigue have improved since completing therapy.  Symptoms of skin changes have improved since completing therapy.   Symptoms of bladder changes have improved since completing therapy.  Symptoms of bowel changes have improved since completing therapy.  The patient has had his colorectal surgery with Dr. Cliffton Asters on 02/17/23 and is doing well.  The patient has a scheduled follow up with his medical oncologist Dr. Ellin Saba  and their surgeon Dr. Cliffton Asters for ongoing surveillance. He was encouraged to call if he develops concerns or questions regarding radiation.  This concludes the interaction.  Ruel Favors, LPN

## 2023-02-23 LAB — SURGICAL PATHOLOGY

## 2023-02-24 ENCOUNTER — Other Ambulatory Visit: Payer: Self-pay

## 2023-02-24 NOTE — Progress Notes (Signed)
The proposed treatment discussed in conference is for discussion purpose only and is not a binding recommendation.  The patients have not been physically examined, or presented with their treatment options.  Therefore, final treatment plans cannot be decided.  

## 2023-03-02 DIAGNOSIS — Z932 Ileostomy status: Secondary | ICD-10-CM | POA: Diagnosis not present

## 2023-03-08 ENCOUNTER — Ambulatory Visit (HOSPITAL_COMMUNITY)
Admission: RE | Admit: 2023-03-08 | Discharge: 2023-03-08 | Disposition: A | Discharge status: Home / Self Care | Payer: No Typology Code available for payment source | Source: Ambulatory Visit | Attending: Surgery | Admitting: Surgery

## 2023-03-08 DIAGNOSIS — C189 Malignant neoplasm of colon, unspecified: Secondary | ICD-10-CM | POA: Diagnosis not present

## 2023-03-08 DIAGNOSIS — Z432 Encounter for attention to ileostomy: Secondary | ICD-10-CM | POA: Insufficient documentation

## 2023-03-08 DIAGNOSIS — L24B3 Irritant contact dermatitis related to fecal or urinary stoma or fistula: Secondary | ICD-10-CM

## 2023-03-08 DIAGNOSIS — Z932 Ileostomy status: Secondary | ICD-10-CM

## 2023-03-08 NOTE — Progress Notes (Signed)
[facility]   Reason for visit:  RLQ loop ileostomy HPI:  Colon cancer with loop ileostomy Past Medical History:  Diagnosis Date  . Anemia   . Anxiety   . Depression   . Rectal cancer (HCC) 06/08/2022   Family History  Problem Relation Age of Onset  . Cancer Mother 61 - 51       GYN malignancy details unknown, TAH/BSO  . Colon cancer Father 16  . Heart attack Father   . Colon cancer Paternal Aunt 21  . Colon cancer Paternal Grandmother 62   Allergies  Allergen Reactions  . Hydrocodone Itching  . Latex     Eye irritation    Current Outpatient Medications  Medication Sig Dispense Refill Last Dose  . dextrose 5 % SOLN 1,000 mL with fluorouracil 5 GM/100ML SOLN Inject into the vein over 48 hr. Every 14 days     . escitalopram (LEXAPRO) 10 MG tablet Take 1 tablet (10 mg total) by mouth daily. 90 tablet 3   . gabapentin (NEURONTIN) 300 MG capsule Take 1 capsule (300 mg total) by mouth 3 (three) times daily. 90 capsule 1   . loperamide (IMODIUM) 2 MG capsule Take 1 capsule (2 mg total) by mouth 3 (three) times daily. 30 capsule 0    No current facility-administered medications for this encounter.   ROS  Review of Systems  Constitutional:  Positive for fatigue.  Gastrointestinal:        LUQ ileostomy  Skin:  Positive for color change.       Peristomal irritation  All other systems reviewed and are negative. Vital signs:  There were no vitals taken for this visit. Exam:  Physical Exam Vitals reviewed.  Constitutional:      Appearance: Normal appearance.  HENT:     Mouth/Throat:     Mouth: Mucous membranes are moist.  Abdominal:     Palpations: Abdomen is soft.  Skin:    General: Skin is warm and dry.     Findings: Erythema present.  Neurological:     Mental Status: He is alert and oriented to person, place, and time.  Psychiatric:        Mood and Affect: Mood normal.        Behavior: Behavior normal.    Stoma type/location: RLQ ileostomy Stomal  assessment/size:  1 3/8" pink and moist Peristomal assessment:  minimal contact dermatitis Treatment options for stomal/peristomal skin: stoma powder skin prep and barrier ring Output: output has slowed Ostomy pouching: 2pc. 2 1/4"  not requiring high output at this time  Education provided:  Applied belt today for added security.      Impression/dx  Ileostomy Contact dermatitis Discussion  See back 2 weeks to assess skin and pouching system Plan  Appointment for 2 weeks.     Visit time: 45 minutes.   Maple Hudson FNP-BC

## 2023-03-11 ENCOUNTER — Encounter: Payer: Self-pay | Admitting: Hematology

## 2023-03-12 DIAGNOSIS — Z932 Ileostomy status: Secondary | ICD-10-CM | POA: Insufficient documentation

## 2023-03-12 DIAGNOSIS — L24B3 Irritant contact dermatitis related to fecal or urinary stoma or fistula: Secondary | ICD-10-CM | POA: Insufficient documentation

## 2023-03-14 ENCOUNTER — Encounter: Payer: Self-pay | Admitting: Hematology

## 2023-03-17 ENCOUNTER — Other Ambulatory Visit: Payer: Self-pay

## 2023-03-17 DIAGNOSIS — C20 Malignant neoplasm of rectum: Secondary | ICD-10-CM

## 2023-03-18 ENCOUNTER — Encounter (HOSPITAL_COMMUNITY): Payer: Self-pay | Admitting: Surgery

## 2023-03-19 ENCOUNTER — Other Ambulatory Visit: Payer: Self-pay

## 2023-03-20 ENCOUNTER — Encounter (HOSPITAL_COMMUNITY): Payer: Self-pay | Admitting: Surgery

## 2023-03-20 NOTE — Anesthesia Preprocedure Evaluation (Addendum)
Anesthesia Evaluation  Patient identified by MRN, date of birth, ID band Patient awake    Reviewed: Allergy & Precautions, H&P , NPO status , Patient's Chart, lab work & pertinent test results  Airway Mallampati: II  TM Distance: >3 FB Neck ROM: Full    Dental no notable dental hx. (+) Teeth Intact, Dental Advisory Given   Pulmonary neg pulmonary ROS, former smoker   Pulmonary exam normal breath sounds clear to auscultation       Cardiovascular negative cardio ROS Normal cardiovascular exam Rhythm:Regular Rate:Normal     Neuro/Psych  PSYCHIATRIC DISORDERS Anxiety Depression    negative neurological ROS  negative psych ROS   GI/Hepatic negative GI ROS, Neg liver ROS,,,  Endo/Other  negative endocrine ROS    Renal/GU negative Renal ROS  negative genitourinary   Musculoskeletal negative musculoskeletal ROS (+)    Abdominal   Peds negative pediatric ROS (+)  Hematology negative hematology ROS (+) Blood dyscrasia, anemia   Anesthesia Other Findings   Reproductive/Obstetrics negative OB ROS                             Anesthesia Physical Anesthesia Plan  ASA: 2  Anesthesia Plan: MAC   Post-op Pain Management: Minimal or no pain anticipated   Induction: Intravenous  PONV Risk Score and Plan: 2 and Treatment may vary due to age or medical condition and Propofol infusion  Airway Management Planned: Natural Airway and Simple Face Mask  Additional Equipment: None  Intra-op Plan:   Post-operative Plan:   Informed Consent: I have reviewed the patients History and Physical, chart, labs and discussed the procedure including the risks, benefits and alternatives for the proposed anesthesia with the patient or authorized representative who has indicated his/her understanding and acceptance.     Dental advisory given  Plan Discussed with: CRNA and Anesthesiologist  Anesthesia Plan  Comments:        Anesthesia Quick Evaluation

## 2023-03-22 ENCOUNTER — Ambulatory Visit (HOSPITAL_COMMUNITY)
Admission: RE | Admit: 2023-03-22 | Discharge: 2023-03-22 | Disposition: A | Discharge status: Home / Self Care | Payer: No Typology Code available for payment source | Attending: Surgery | Admitting: Surgery

## 2023-03-22 ENCOUNTER — Ambulatory Visit (HOSPITAL_COMMUNITY): Payer: No Typology Code available for payment source | Admitting: Anesthesiology

## 2023-03-22 ENCOUNTER — Other Ambulatory Visit: Payer: Self-pay

## 2023-03-22 ENCOUNTER — Encounter (HOSPITAL_COMMUNITY): Payer: Self-pay | Admitting: Surgery

## 2023-03-22 ENCOUNTER — Encounter (HOSPITAL_COMMUNITY)
Admission: RE | Disposition: A | Discharge status: Home / Self Care | Payer: Self-pay | Source: Home / Self Care | Attending: Surgery

## 2023-03-22 ENCOUNTER — Ambulatory Visit (HOSPITAL_BASED_OUTPATIENT_CLINIC_OR_DEPARTMENT_OTHER): Payer: No Typology Code available for payment source | Admitting: Anesthesiology

## 2023-03-22 DIAGNOSIS — Z85048 Personal history of other malignant neoplasm of rectum, rectosigmoid junction, and anus: Secondary | ICD-10-CM

## 2023-03-22 DIAGNOSIS — Z09 Encounter for follow-up examination after completed treatment for conditions other than malignant neoplasm: Secondary | ICD-10-CM | POA: Diagnosis present

## 2023-03-22 DIAGNOSIS — Z980 Intestinal bypass and anastomosis status: Secondary | ICD-10-CM | POA: Diagnosis not present

## 2023-03-22 DIAGNOSIS — Z932 Ileostomy status: Secondary | ICD-10-CM | POA: Diagnosis not present

## 2023-03-22 DIAGNOSIS — Z87891 Personal history of nicotine dependence: Secondary | ICD-10-CM | POA: Insufficient documentation

## 2023-03-22 DIAGNOSIS — Z08 Encounter for follow-up examination after completed treatment for malignant neoplasm: Secondary | ICD-10-CM | POA: Diagnosis not present

## 2023-03-22 DIAGNOSIS — Z86010 Personal history of colonic polyps: Secondary | ICD-10-CM | POA: Diagnosis not present

## 2023-03-22 HISTORY — PX: FLEXIBLE SIGMOIDOSCOPY: SHX5431

## 2023-03-22 SURGERY — SIGMOIDOSCOPY, FLEXIBLE
Anesthesia: Monitor Anesthesia Care

## 2023-03-22 MED ORDER — PROPOFOL 500 MG/50ML IV EMUL
INTRAVENOUS | Status: DC | PRN
  Administered 2023-03-22: 155 ug/kg/min via INTRAVENOUS

## 2023-03-22 MED ORDER — MIDAZOLAM HCL 2 MG/2ML IJ SOLN
INTRAMUSCULAR | Status: AC
  Filled 2023-03-22: qty 2

## 2023-03-22 MED ORDER — LACTATED RINGERS IV SOLN: INTRAVENOUS | Status: DC

## 2023-03-22 MED ORDER — GLYCOPYRROLATE 0.2 MG/ML IJ SOLN
INTRAMUSCULAR | Status: DC | PRN
  Administered 2023-03-22: .2 mg via INTRAVENOUS

## 2023-03-22 MED ORDER — MIDAZOLAM HCL 5 MG/5ML IJ SOLN
INTRAMUSCULAR | Status: DC | PRN
  Administered 2023-03-22: 2 mg via INTRAVENOUS

## 2023-03-22 NOTE — Op Note (Signed)
[name] Patient Name: Troy Dillon Procedure Date: 03/22/2023 MRN: [id number] Attending MD: Andria Meuse MD, MD,  Date of Birth: [birth year] CSN: [id number] Age: 52 Admit Type: Outpatient Procedure:                Flexible Sigmoidoscopy Indications:              Personal history of malignant rectal neoplasm,                            Preoperative assessment Providers:                Stephanie Coup. Jayelle Page MD, MD, Doristine Mango, RN,                            Rozetta Nunnery, Technician Referring MD:              Medicines:                Monitored Anesthesia Care Complications:            No immediate complications. Estimated Blood Loss:     Estimated blood loss: none. Procedure:                Pre-Anesthesia Assessment:                           - Prior to the procedure, a History and Physical                            was performed, and patient medications, allergies                            and sensitivities were reviewed. The patient's                            tolerance of previous anesthesia was reviewed.                           - The risks and benefits of the procedure and the                            sedation options and risks were discussed with the                            patient. All questions were answered and informed                            consent was obtained.                           - Patient identification and proposed procedure                            were verified prior to the procedure by the                            physician, the nurse and the anesthetist.  The                            procedure was verified in the pre-procedure area in                            the endoscopy suite.                           After obtaining informed consent, the scope was                            passed under direct vision. The [id number]-[id number] ([id number])                            Olympus endoscope was introduced through the anus                             and advanced to the the splenic flexure. The                            flexible sigmoidoscopy was accomplished without                            difficulty. The patient tolerated the procedure                            well. The quality of the bowel preparation was                            adequate. Scope In: Scope Out: Findings:      The perianal and digital rectal examinations were normal. Pertinent       negatives include no palpable rectal lesions.      There was evidence of a prior end-to-end colorectal anastomosis in the       mid rectum. This was patent and was characterized by healthy appearing       mucosa. The anastomosis was traversed. Estimated blood loss: none. No       biopsies or other specimens were collected for this exam. Impression:               - Patent end-to-end colo-colonic anastomosis,                            characterized by healthy appearing mucosa. No                            specimens collected. Moderate Sedation:      Not Applicable - Patient had care per Anesthesia. Recommendation:           - Resume previous diet. Procedure Code(s):        --- Professional ---                           [phone], Sigmoidoscopy, flexible; diagnostic,  including collection of specimen(s) by brushing or                            washing, when performed (separate procedure) Diagnosis Code(s):        --- Professional ---                           O96.295, Personal history of other malignant                            neoplasm of rectum, rectosigmoid junction, and anus                           Z98.0, Intestinal bypass and anastomosis status                           Z01.818, Encounter for other preprocedural                            examination CPT copyright 2022 [employer]. All rights reserved. The codes documented in this report are preliminary and upon coder review may  be revised to meet current  compliance requirements. Marin Olp, MD Andria Meuse MD, MD 03/22/2023 7:35:49 AM This report has been signed electronically. Number of Addenda: 0

## 2023-03-22 NOTE — Transfer of Care (Signed)
Immediate Anesthesia Transfer of Care Note  Patient: Troy Dillon  Procedure(s) Performed: Procedure(s): FLEXIBLE SIGMOIDOSCOPY (N/A)  Patient Location: Endoscopy Unit  Anesthesia Type:MAC  Level of Consciousness:  sedated, patient cooperative and responds to stimulation  Airway & Oxygen Therapy:Patient Spontanous Breathing and Patient connected to face mask oxgen  Post-op Assessment:  Report given to PACU RN and Post -op Vital signs reviewed and stable  Post vital signs:  Reviewed and stable  Last Vitals:  Vitals:   03/22/23 0634  BP: 124/82  Pulse: (!) 54  Resp: 12  Temp: 36.7 C  SpO2: 99%    Complications: No apparent anesthesia complications

## 2023-03-22 NOTE — H&P (Signed)
CC: Here today for surgery  HPI: Troy Dillon is an 52 y.o. male with history of , whom is seen in the office today as a referral by Dr. Seymour Dillon for evaluation of rectal cancer.   Cscope 03/2010 - Dr. Karilyn Dillon - redundant hepatic flexure; 2 polyps from sigmoid removed. Tiny polyp at rectosigmoid jxn coagulated using snare tip.  Cscope 06/08/22 Dr. Marcha Dillon- Impression:  - Malignant partially obstructing tumor in the rectum.  Biopsied. Tattooed.  - One large polyp at 40 cm proximal to the anus,  removed with a hot snare. Resected and retrieved.  Clips were placed.  - Three small polyps in the cecum, removed with a hot  snare. Resected and retrieved.   Mass in rectum showed adenocarcinoma by report of Dr. Mitzi Dillon. He met with Dr. Marcha Dillon at [facility].  MRI Pelvis 06/12/22 - rectal mass cmriT3cN1 extending up into sigmoid; +EMVI. Mass is 7 cm from IAS. Appears just above reflection in mid and upper rectum.  CT CAP 06/12/22 -  1. Circumferential wall thickening of the upper/mid rectum may reflect patient's known rectal mass. 2. Mildly enlarged high perirectal lymph nodes measure up to 6 mm in short axis, nonspecific but suspicious for locoregional nodal disease. 3. No evidence of distant metastatic disease within the chest, abdomen or pelvis.  Following with Drs. Troy Dillon and Troy Dillon.  He reports his colonoscopy was done for rectal bleeding which is also the reason for his colonoscopy back in 2011. He denies abdominal pain, nausea, vomiting, diarrhea.  Genetics negative  MRI Pelvis 10/13/22  *Decreased eccentric high rectal/low sigmoid wall thickening compatible with treatment response. Evaluation for restricted diffusion is limited on this examination secondary to technique however there is probable mild reduced ADC and increased DWI signal within portions of the tumor suggestive of residual disease. Consider further evaluation with colonoscopy. *Decreased size of the high mesorectal or  superior rectal lymph node.  He has met back with medical and now radiation oncology. Has been doing well. No complaints at present. He is here today with his wife.  Completed cXRT 12/18/22 CT CAP 01/15/2023 1. Significant interval decrease in the concentric high rectal/low sigmoid wall thickening. 2. Decreased size of the mesorectal/sigmoid mesentery lymph nodes. 3. Clusters of micro nodularity in the anterior right upper lobe and new 2-3 mm right lower lobe pulmonary nodule, favored to be infectious/inflammatory in etiology. Suggest short-term interval follow-up dedicated chest CT to ensure resolution. 4. No definitive evidence of distant metastatic disease in the chest, abdomen or pelvis.  INTERVAL HX OR 02/17/23 Robotic assisted low anterior resection with double stapled colorectal anastomosis Diagnostic flexible sigmoidoscopy (necessary to confirm location of rectal cancer) Intraoperative assessment of perfusion using ICG fluorescence imaging Bilateral transversus abdominus plane (TAP) blocks  FINDINGS: ?Early/very small right inguinal hernia. Tattoo just above the peritoneal reflection. No obvious mass. Diagnostic flexible sigmoidoscopy was therefore carried out to facilitate identification of the location of the lesion. This is located on the proximal valve of [name]. No evident metastatic disease on visceral parietal peritoneum or liver. Robotic LAR carried out. A well perfused, tension free, hemostatic, air tight 31 mm EEA colorectal anastomosis fashioned 8 cm from the anal verge by flexible sigmoidoscopy.  PATH A. COLON, RECTOSIGMOID, RESECTION:  - Residual invasive moderately differentiated adenocarcinoma,  discontinuously involving a fibrotic area of about 2.2 cm  - Carcinoma invades into pericolonic soft tissue  - Resection margins are negative for carcinoma  - Metastatic carcinoma involving two of twenty-two lymph nodes (2/22)  -  Therapy-related changes  - See oncology  table   B. FINAL DISTAL MARGIN:  - Colonic donut with no specific histologic changes  - Negative for carcinoma   Here today for follow-up. He has been doing well. Denies any complaints at present. He has been doing quite well. Ostomy is working well. Output is less than 1.2 L/day. Consistency is toothpaste. No significant abdominal pain. Some incisional soreness. Good appetite.   He denies any changes in health or health history since we met in the office. No new medications/allergies. He states he is ready for flex sig today.  Past Medical History:  Diagnosis Date   Anemia    Anxiety    Depression    Rectal cancer (HCC) 06/08/2022    Past Surgical History:  Procedure Laterality Date   COLONOSCOPY  06/08/2022   EYE SURGERY     FLEXIBLE SIGMOIDOSCOPY N/A 02/17/2023   Procedure: FLEXIBLE SIGMOIDOSCOPY;  Surgeon: Andria Meuse, MD;  Location: [facility];  Service: General;  Laterality: N/A;   FRACTURE SURGERY     Right Femur- Cables in bone remain in place, rod removed in 2000   IR IMAGING GUIDED PORT INSERTION  06/22/2022   ORTHOPEDIC SURGERY     XI ROBOTIC ASSISTED LOWER ANTERIOR RESECTION Bilateral 02/17/2023   Procedure: XI ROBOTIC ASSISTED LOWER ANTERIOR RESECTION, DIVERTING LOOP ILEOSTOMY, BILATERAL TAP BLOCK, TISSUE PERFUSION ASSESSMENT VIA FIREFLY INJECTION;  Surgeon: Andria Meuse, MD;  Location: [facility];  Service: General;  Laterality: Bilateral;    Family History  Problem Relation Age of Onset   Cancer Mother 19 - 61       GYN malignancy details unknown, TAH/BSO   Colon cancer Father 59   Heart attack Father    Colon cancer Paternal Aunt 70   Colon cancer Paternal Grandmother 77    Social:  reports that he quit smoking about 21 years ago. His smoking use included cigarettes. He started smoking about 36 years ago. He has a 15 pack-year smoking history. He has quit using smokeless tobacco.  His smokeless tobacco use included snuff. He reports current alcohol  use. He reports that he does not use drugs.  Allergies:  Allergies  Allergen Reactions   Hydrocodone Itching   Latex     Eye irritation     Medications: I have reviewed the patient's current medications.  No results found for this or any previous visit (from the past 48 hour(s)).  No results found.   PE Blood pressure 124/82, pulse (!) 54, temperature 98.1 F (36.7 C), temperature source Tympanic, resp. rate 12, height 5\' 11"  (1.803 m), weight 83.4 kg, SpO2 99%. Constitutional: NAD; conversant Eyes: Moist conjunctiva; no lid lag; anicteric Lungs: Normal respiratory effort CV: RRR GI: Abd soft, NT/ND Psychiatric: Appropriate affect  No results found for this or any previous visit (from the past 48 hour(s)).  No results found.  A/P: Troy Dillon is an 52 y.o. male with cmriT3cN1 rectal adenocarcinoma, mid rectum now s/p TNT and surgery  CT CAP 05/2022 no evidence of metastatic disease  cmriT3N1 mid to proximal rectal cancer  CT CAP 01/15/23 - clusters of micronodularity in RUL and 2-3 mm nodule RLL - surveillance as per Dr. Ellin Saba  -We spent time reviewing his procedure, findings, pathology, and plans moving forward. We again reiterated goals with ileostomy including toothpaste consistency and less than 1.2 L/day. We reiterated the importance of hydration.  -He would like the portacatheter to be removed at the time of ileostomy reversal if  at all possible. Will defer to Dr. Kirtland Bouchard regarding this  -Plan flex sig to evaluate anastomosis -The anatomy and physiology of the GI tract was discussed with the patient.  -The planned procedure, material risks (including, but not limited to, pain, bleeding, infection, scarring, damage to surrounding structures- blood vessels/nerves/viscus/organs, leak from anastomosis, need for additional procedures, worsening of pre-existing medical conditions, chronic diarrhea, constipation secondary to narcotic use, pneumonia, heart attack, stroke,  death) benefits and alternatives to surgery were discussed at length. The patient's questions were answered to his satisfaction, he voiced understanding and elected to proceed with surgery. Additionally, we discussed typical postoperative expectations and the recovery process.   Marin Olp, MD [facility]

## 2023-03-22 NOTE — Discharge Instructions (Signed)
No driving for 24 hours, resume normal diet, call surgeons office if any problems persist.

## 2023-03-22 NOTE — Anesthesia Postprocedure Evaluation (Signed)
Anesthesia Post Note  Patient: Troy Dillon  Procedure(s) Performed: FLEXIBLE SIGMOIDOSCOPY     Patient location during evaluation: PACU Anesthesia Type: MAC Level of consciousness: awake and alert Pain management: pain level controlled Vital Signs Assessment: post-procedure vital signs reviewed and stable Respiratory status: spontaneous breathing, nonlabored ventilation, respiratory function stable and patient connected to nasal cannula oxygen Cardiovascular status: stable and blood pressure returned to baseline Postop Assessment: no apparent nausea or vomiting Anesthetic complications: no   No notable events documented.  Last Vitals:  Vitals:   03/22/23 0740 03/22/23 0750  BP: 110/74 112/69  Pulse: 68 65  Resp: 14 14  Temp:    SpO2: 98% 98%    Last Pain:  Vitals:   03/22/23 0750  TempSrc:   PainSc: 0-No pain                 Delesia Martinek

## 2023-03-25 ENCOUNTER — Encounter (HOSPITAL_COMMUNITY): Payer: Self-pay | Admitting: Surgery

## 2023-03-29 ENCOUNTER — Other Ambulatory Visit (HOSPITAL_COMMUNITY): Payer: Self-pay

## 2023-03-29 ENCOUNTER — Inpatient Hospital Stay: Payer: No Typology Code available for payment source

## 2023-03-29 ENCOUNTER — Inpatient Hospital Stay: Payer: No Typology Code available for payment source | Attending: Hematology | Admitting: Hematology

## 2023-03-29 VITALS — BP 119/69 | HR 72 | Temp 99.5°F | Resp 18

## 2023-03-29 DIAGNOSIS — G629 Polyneuropathy, unspecified: Secondary | ICD-10-CM | POA: Diagnosis not present

## 2023-03-29 DIAGNOSIS — C20 Malignant neoplasm of rectum: Secondary | ICD-10-CM

## 2023-03-29 DIAGNOSIS — Z87891 Personal history of nicotine dependence: Secondary | ICD-10-CM | POA: Diagnosis not present

## 2023-03-29 NOTE — Patient Instructions (Addendum)
[facility]  Discharge Instructions  You were seen and examined today by Dr. Ellin Saba.  Your labs are stable.  Moving forward, you will follow-up every 3 months with labs and scans every 6 months.  Follow-up as scheduled.  Thank you for choosing [facility] to provide your oncology and hematology care.   To afford each patient quality time with our provider, please arrive at least 15 minutes before your scheduled appointment time. You may need to reschedule your appointment if you arrive late (10 or more minutes). Arriving late affects you and other patients whose appointments are after yours.  Also, if you miss three or more appointments without notifying the office, you may be dismissed from the clinic at the provider's discretion.    Again, thank you for choosing [facility].  Our hope is that these requests will decrease the amount of time that you wait before being seen by our physicians.   If you have a lab appointment with the Cancer Center - please note that after April 8th, all labs will be drawn in the cancer center.  You do not have to check in or register with the main entrance as you have in the past but will complete your check-in at the cancer center.            _____________________________________________________________  Should you have questions after your visit to [facility], please contact our office at [phone] and follow the prompts.  Our office hours are 8:00 a.m. to 4:30 p.m. Monday - Thursday and 8:00 a.m. to 2:30 p.m. Friday.  Please note that voicemails left after 4:00 p.m. may not be returned until the following business day.  We are closed weekends and all major holidays.  You do have access to a nurse 24-7, just call the main number to the clinic [phone] and do not press any options, hold on the line and a nurse will answer the phone.    For prescription refill  requests, have your pharmacy contact our office and allow 72 hours.    Masks are no longer required in the cancer centers. If you would like for your care team to wear a mask while they are taking care of you, please let them know. You may have one support person who is at least 52 years old accompany you for your appointments.

## 2023-03-29 NOTE — Progress Notes (Signed)
[facility] [address], [place] [address]    Clinic Day:  03/29/2023  Referring physician: [facility]  Patient Care Team: [facility] as PCP - General [name], MD as PCP - Cardiology (Cardiology) Doreatha Massed, MD as Medical Oncologist (Medical Oncology) Therese Sarah, RN as Oncology Nurse Navigator (Medical Oncology)   ASSESSMENT & PLAN:   Assessment: 1.  Stage IIIB (T3cN1) rectal adenocarcinoma, MSI stable: - Presentation with rectal bleeding - Colonoscopy (06/08/2022): Fungating partially obstructing large mass found in the rectum 11 cm from anal verge.  Partially circumferential involving one half of the lumen.  Mass measured 3 cm in length.  Diameter 2 cm.  Oozing present.  1 semipedunculated polyp (40 cm from anus) and 3 sessile polyps in the cecum removed. - Pathology (06/08/2022): Rectal mass 11 cm from anal verge-adenocarcinoma.  MMR preserved.  MSI-stable. - CEA (06/08/2022): 2.9 - MRI pelvis (06/10/2022): T3CN1, tumor extends into sigmoid from the high rectum, extension through muscularis propria, approximately 6 mm beyond the rectal wall.  Tumor begins in the highly portion of the rectum and extends into the sigmoid colon.  Extramural vascular invasion/tumor thrombus along the right lateral margin.  Shortest distance of tumor from mesorectal fascia: 15 mm.  Single high mesorectal or superior rectal lymph node measuring 9 mm.  Distance from tumor to internal anal sphincter is approximately 7-11 cm. - CT CAP (06/10/2022): Circumferential wall thickening of the upper/mid rectum, mildly enlarged high perirectal lymph nodes measuring up to 6 mm in short axis.  No evidence of distant metastatic disease in the chest, abdomen or pelvis. - Neoadjuvant FOLFOX 8 cycles from 06/29/2022 through 10/05/2022 - MRI pelvis (10/13/2022): Evidence of decreased extramural vascular invasion along the right lateral margin.  Tumor  length 3.4 cm, previously 4.5 cm.  Significant interval decrease thickening of high rectum/low sigmoid colon.  Mesorectal lymph nodes 7 mm, previously 9 mm in 4 mm node.  I discussed with radiologist who felt that there is more than 20% clinical improvement. - I have recommended foregoing radiation based on Prospect trial data.  He met with Dr. Cliffton Asters and Dr. Mitzi Hansen who have recommended chemoradiation. - Chemoradiation with Xeloda from 11/11/2022 through 12/18/2022. - Robotic assisted low anterior resection on 02/17/2023 by Dr. Cliffton Asters - Pathology: [id number], 2/22 lymph nodes involved, margins negative, residual moderate differentiated adenocarcinoma discontinuously involving a fibrotic area of about 2.2 cm.  MMR preserved.   2.  Social/family history: - He lives at home with his wife [name].  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  Denies any chemical exposure.  Does not smoke cigarettes but dips tobacco. - Father had colon cancer at age 55. - Paternal grandmother died of colon cancer. - Paternal aunt had colon cancer. - Mother had "male cancer" and underwent hysterectomy.    Plan: 1.  Stage IIIb (T3CN1) rectal adenocarcinoma, MSI-stable: - CT CAP on 01/15/2023: No evidence of metastatic disease. - We reviewed surgical pathology results in detail. - We discussed surveillance plan with blood work including CEA, Signatera testing every 3 months for the first 2 years.  Will also do imaging of the abdomen and pelvis every 6 months during the first 2 years. - We talked about Signatera testing for disease monitoring.  Will send a sample today. - Recommend follow-up in 3 months with blood work and CT scan CAP. - He wants to have Port-A-Cath removed as it hurts sometimes when he is doing work.  It is okay for Dr. Cliffton Asters to remove it when he does reversal of ileostomy.   2.  Peripheral neuropathy: - He does not report any pain in the right forearm.  He stopped taking gabapentin. - He has numbness in  the fingertips and feet.  Will closely monitor.     Orders Placed This Encounter  Procedures   CT CHEST ABDOMEN PELVIS W CONTRAST    Standing Status:   Future    Standing Expiration Date:   03/28/2024    Order Specific Question:   If indicated for the ordered procedure, I authorize the administration of contrast media per Radiology protocol    Answer:   Yes    Order Specific Question:   Does the patient have a contrast media/X-ray dye allergy?    Answer:   No    Order Specific Question:   Preferred imaging location?    Answer:   [facility]    Order Specific Question:   Release to patient    Answer:   Immediate    Order Specific Question:   If indicated for the ordered procedure, I authorize the administration of oral contrast media per Radiology protocol    Answer:   Yes   Signatera    Select as applicable. If patient is on or planning to receive immunotherapy, select drug: Not on Immunotherapy If "Other or Multiple", Write down drug name:  Do not Delete Below This Line   ==========Department Information========== ID: [id number] Department:[facility] [address] [place] [address] Dept: [phone] Dept Fax: [phone]    Standing Status:   Standing    Number of Occurrences:   10    Standing Expiration Date:   03/28/2024    Order Specific Question:   Avelina Laine to follow up with patient for sample collection (mobile phleb, lab, or saliva):    Answer:   No    Order Specific Question:   Surveillance Program draw frequency:    Answer:   Every 3 Months    Order Specific Question:   Surveillance Program draw count:    Answer:   4    Order Specific Question:   Cancer type:    Answer:   Rectal    Order Specific Question:   Stage of diagnosis:    Answer:   III    Order Specific Question:   History of recurrence?    Answer:   No    Order Specific Question:   Current disease status:    Answer:   No evidence of  disease    Order Specific Question:   Date of surgery (MM/DD/YYYY):    Answer:   02/17/2023    Order Specific Question:   Is the patient receiving or planning to receive immunotherapy?    Answer:   No    Order Specific Question:   Patient status:    Answer:   Outpatient    Order Specific Question:   Most recent progress/clinical note attached?    Answer:   Yes    Order Specific Question:   Pathology report attached?    Answer:   Yes    Order Specific Question:   Pathology institution name:    Answer:   [facility]    Order Specific Question:   Pathology institution address and fax number:    Answer:   [phone]    Order Specific Question:   Tissue collection date (MM/DD/YYYY):  Answer:   02/17/2023    Order Specific Question:   By placing this electronic order I confirm the testing ordered herein is medically necessary and this patient has been informed of the details of the genetic test(s) ordered, including the risks, benefits, and alternatives, and has consented to testing.    Answer:   No    Order Specific Question:   What type of billing?    Answer:   [employer] Specific Question:   Release to patient    Answer:   Immediate [1]   CEA    Standing Status:   Standing    Number of Occurrences:   5    Standing Expiration Date:   03/28/2024    Order Specific Question:   Release to patient    Answer:   Immediate [1]    Order Specific Question:   Remote health to draw?    Answer:   No      I,Helena R Teague,acting as a scribe for Doreatha Massed, MD.,have documented all relevant documentation on the behalf of Doreatha Massed, MD,as directed by  Doreatha Massed, MD while in the presence of Doreatha Massed, MD.  I, Doreatha Massed MD, have reviewed the above documentation for accuracy and completeness, and I agree with the above.    Doreatha Massed, MD   8/5/20246:00 PM  CHIEF COMPLAINT:   Diagnosis: stage III rectal  cancer     Cancer Staging  Rectal carcinoma [facility]) Staging form: Colon and Rectum, AJCC 8th Edition - Clinical stage from 06/18/2022: Stage IIIB (cT3, cN1, cM0) - Unsigned    Prior Therapy: 1. FOLFOX 06/29/22 - 10/05/22 2. Xeloda, radiation 11/11/22 - 12/18/22  Current Therapy: Surgery on 01/28/2023   HISTORY OF PRESENT ILLNESS:   Oncology History  Rectal carcinoma (HCC)  06/17/2022 Initial Diagnosis   Rectal carcinoma (HCC)   06/29/2022 -  Chemotherapy   Patient is on Treatment Plan : COLORECTAL FOLFOX q14d x 4 months      Genetic Testing   [facility] Common Cancer Panel+RNA was Negative. Report date is 08/29/2022.  The Common Hereditary Cancers Panel offered by [facility] includes sequencing and/or deletion duplication testing of the following 48 genes: APC, ATM, AXIN2, BAP1, BARD1, BMPR1A, BRCA1, BRCA2, BRIP1, CDH1, CDK4, CDKN2A (p14ARF and p16INK4a only), CHEK2, CTNNA1, DICER1, EPCAM (Deletion/duplication testing only), FH, GREM1 (promoter region duplication testing only), HOXB13, KIT, MBD4, MEN1, MLH1, MSH2, MSH3, MSH6, MUTYH, NF1, NHTL1, PALB2, PDGFRA, PMS2, POLD1, POLE, PTEN, RAD51C, RAD51D, SDHA (sequencing analysis only except exon 14), SDHB, SDHC, SDHD, SMAD4, SMARCA4. STK11, TP53, TSC1, TSC2, and VHL.      INTERVAL HISTORY:   [place] is a 52 y.o. male presenting to clinic today for follow up of stage III rectal cancer. He was last seen by me on 01/20/23.  Since his last visit, he underwent lower anterior resection, diverting loop ileostomy, bilateral tap block, tissue perfusion assessment via firefly injection on 02/17/23 with Dr. Cliffton Asters. Surgical pathology of the rectosigmoid colon revealed: residual invasive moderately differentiated adenocarcinoma, discontinuously involving a fibrotic area of about 2.2 cm; carcinoma invades into pericolonic soft tissue; resection margins are negative for carcinoma; the final distal margin negative for carcinoma; and metastatic carcinoma involving two of  twenty-two lymph nodes.   He also underwent a sigmoidoscopy on 03/22/23 with Dr. Cliffton Asters. He has a colostomy scheduled tomorrow with Dr. Cliffton Asters.   Today, he states that he is doing well overall. His appetite level is at 100%. His energy level is  at 85%. He is accompanied by his wife.   He reports a normal appetite. He has not been taking Gabapentin for 1 month, as he denies any pain from neuropathy. He does note numbness in the fingers and feet. He currently has an ostomy bag with a normal output. He c/o burning at the site of his port and would like to have it removed with his upcoming surgery.   PAST MEDICAL HISTORY:   Past Medical History: Past Medical History:  Diagnosis Date   Anemia    Anxiety    Depression    Rectal cancer (HCC) 06/08/2022    Surgical History: Past Surgical History:  Procedure Laterality Date   COLONOSCOPY  06/08/2022   EYE SURGERY     FLEXIBLE SIGMOIDOSCOPY N/A 02/17/2023   Procedure: FLEXIBLE SIGMOIDOSCOPY;  Surgeon: Andria Meuse, MD;  Location: [facility];  Service: General;  Laterality: N/A;   FLEXIBLE SIGMOIDOSCOPY N/A 03/22/2023   Procedure: Arnell Sieving;  Surgeon: Andria Meuse, MD;  Location: [facility];  Service: General;  Laterality: N/A;   FRACTURE SURGERY     Right Femur- Cables in bone remain in place, rod removed in 2000   IR IMAGING GUIDED PORT INSERTION  06/22/2022   ORTHOPEDIC SURGERY     XI ROBOTIC ASSISTED LOWER ANTERIOR RESECTION Bilateral 02/17/2023   Procedure: XI ROBOTIC ASSISTED LOWER ANTERIOR RESECTION, DIVERTING LOOP ILEOSTOMY, BILATERAL TAP BLOCK, TISSUE PERFUSION ASSESSMENT VIA FIREFLY INJECTION;  Surgeon: Andria Meuse, MD;  Location: [facility];  Service: General;  Laterality: Bilateral;    Social History: Social History   Socioeconomic History   Marital status: Married    Spouse name: Not on file   Number of children: Not on file   Years of education: Not on file   Highest education level: Not on  file  Occupational History   Not on file  Tobacco Use   Smoking status: Former    Current packs/day: 0.00    Average packs/day: 1 pack/day for 15.0 years (15.0 ttl pk-yrs)    Types: Cigarettes    Start date: 43    Quit date: 2003    Years since quitting: 21.6   Smokeless tobacco: Former    Types: Snuff   Tobacco comments:    Some Dipping  Vaping Use   Vaping status: Never Used  Substance and Sexual Activity   Alcohol use: Yes    Comment: occasionally   Drug use: Never   Sexual activity: Not on file  Other Topics Concern   Not on file  Social History Narrative   Not on file   Social Determinants of Health   Financial Resource Strain: Not on file  Food Insecurity: No Food Insecurity (02/17/2023)   Hunger Vital Sign    Worried About Running Out of Food in the Last Year: Never true    Ran Out of Food in the Last Year: Never true  Transportation Needs: No Transportation Needs (02/17/2023)   PRAPARE - Administrator, Civil Service (Medical): No    Lack of Transportation (Non-Medical): No  Physical Activity: Not on file  Stress: Not on file  Social Connections: Not on file  Intimate Partner Violence: Not At Risk (02/17/2023)   Humiliation, Afraid, Rape, and Kick questionnaire    Fear of Current or Ex-Partner: No    Emotionally Abused: No    Physically Abused: No    Sexually Abused: No    Family History: Family History  Problem Relation Age of Onset  Cancer Mother 37 - 77       GYN malignancy details unknown, TAH/BSO   Colon cancer Father 46   Heart attack Father    Colon cancer Paternal Aunt 91   Colon cancer Paternal Grandmother 51    Current Medications:  Current Outpatient Medications:    escitalopram (LEXAPRO) 10 MG tablet, Take 1 tablet (10 mg total) by mouth daily., Disp: 90 tablet, Rfl: 3   loperamide (IMODIUM) 2 MG capsule, Take 1 capsule (2 mg total) by mouth 3 (three) times daily. (Patient taking differently: Take 2 mg by mouth as needed  for diarrhea or loose stools.), Disp: 30 capsule, Rfl: 0   Allergies: Allergies  Allergen Reactions   Hydrocodone Itching   Latex     Eye irritation     REVIEW OF SYSTEMS:   Review of Systems  Constitutional:  Negative for chills, fatigue and fever.  HENT:   Negative for lump/mass, mouth sores, nosebleeds, sore throat and trouble swallowing.   Eyes:  Negative for eye problems.  Respiratory:  Negative for cough and shortness of breath.   Cardiovascular:  Negative for chest pain, leg swelling and palpitations.  Gastrointestinal:  Negative for abdominal pain, constipation, diarrhea, nausea and vomiting.  Genitourinary:  Negative for bladder incontinence, difficulty urinating, dysuria, frequency, hematuria and nocturia.   Musculoskeletal:  Negative for arthralgias, back pain, flank pain, myalgias and neck pain.  Skin:  Negative for itching and rash.  Neurological:  Positive for numbness (in fingertips and feet). Negative for dizziness and headaches.  Hematological:  Does not bruise/bleed easily.  Psychiatric/Behavioral:  Negative for depression, sleep disturbance and suicidal ideas. The patient is not nervous/anxious.   All other systems reviewed and are negative.    VITALS:   Blood pressure 119/69, pulse 72, temperature 99.5 F (37.5 C), temperature source Tympanic, resp. rate 18, SpO2 98%.  Wt Readings from Last 3 Encounters:  03/22/23 183 lb 13.8 oz (83.4 kg)  02/17/23 183 lb 12.8 oz (83.4 kg)  02/04/23 183 lb 12.8 oz (83.4 kg)    There is no height or weight on file to calculate BMI.  Performance status (ECOG): 0 - Asymptomatic  PHYSICAL EXAM:   Physical Exam Vitals and nursing note reviewed. Exam conducted with a chaperone present.  Constitutional:      Appearance: Normal appearance.  Cardiovascular:     Rate and Rhythm: Normal rate and regular rhythm.     Pulses: Normal pulses.     Heart sounds: Normal heart sounds.  Pulmonary:     Effort: Pulmonary effort is  normal.     Breath sounds: Normal breath sounds.  Abdominal:     Palpations: Abdomen is soft. There is no hepatomegaly, splenomegaly or mass.     Tenderness: There is no abdominal tenderness.  Musculoskeletal:     Right lower leg: No edema.     Left lower leg: No edema.  Lymphadenopathy:     Cervical: No cervical adenopathy.     Right cervical: No superficial, deep or posterior cervical adenopathy.    Left cervical: No superficial, deep or posterior cervical adenopathy.     Upper Body:     Right upper body: No supraclavicular or axillary adenopathy.     Left upper body: No supraclavicular or axillary adenopathy.  Neurological:     General: No focal deficit present.     Mental Status: He is alert and oriented to person, place, and time.  Psychiatric:        Mood and  Affect: Mood normal.        Behavior: Behavior normal.     LABS:      Latest Ref Rng & Units 02/20/2023    5:45 AM 02/19/2023    4:30 AM 02/18/2023    4:48 AM  CBC  WBC 4.0 - 10.5 K/uL 5.1  5.3  7.3   Hemoglobin 13.0 - 17.0 g/dL 01.0  27.2  53.6   Hematocrit 39.0 - 52.0 % 39.8  39.4  38.1   Platelets 150 - 400 K/uL 141  145  153       Latest Ref Rng & Units 02/20/2023    5:45 AM 02/19/2023    4:30 AM 02/18/2023    4:48 AM  CMP  Glucose 70 - 99 mg/dL 97  98  644   BUN 6 - 20 mg/dL 16  15  13    Creatinine 0.61 - 1.24 mg/dL 0.34  7.42  5.95   Sodium 135 - 145 mmol/L 133  135  135   Potassium 3.5 - 5.1 mmol/L 3.8  3.9  4.3   Chloride 98 - 111 mmol/L 104  104  105   CO2 22 - 32 mmol/L 23  25  24    Calcium 8.9 - 10.3 mg/dL 8.4  8.7  8.2      Lab Results  Component Value Date   CEA1 2.5 01/15/2023   /  CEA  Date Value Ref Range Status  01/15/2023 2.5 0.0 - 4.7 ng/mL Final    Comment:    (NOTE)                             Nonsmokers          <3.9                             Smokers             <5.6 [employer] Electrochemiluminescence Immunoassay (ECLIA) Values obtained with different assay  methods or kits cannot be used interchangeably.  Results cannot be interpreted as absolute evidence of the presence or absence of malignant disease. Performed At: [facility] [address] [place], [place] [id number] Jolene Schimke MD IR:[id number]    No results found for: "PSA1" No results found for: "[id number]" No results found for: "CAN125"  No results found for: "TOTALPROTELP", "ALBUMINELP", "A1GS", "A2GS", "BETS", "BETA2SER", "GAMS", "MSPIKE", "SPEI" No results found for: "TIBC", "FERRITIN", "IRONPCTSAT" No results found for: "LDH"   STUDIES:   No results found.

## 2023-03-30 ENCOUNTER — Ambulatory Visit
Admission: RE | Admit: 2023-03-30 | Discharge: 2023-03-30 | Disposition: A | Discharge status: Home / Self Care | Payer: No Typology Code available for payment source | Source: Ambulatory Visit | Attending: Surgery | Admitting: Surgery

## 2023-03-30 DIAGNOSIS — C20 Malignant neoplasm of rectum: Secondary | ICD-10-CM

## 2023-03-30 DIAGNOSIS — D012 Carcinoma in situ of rectum: Secondary | ICD-10-CM | POA: Diagnosis not present

## 2023-03-30 DIAGNOSIS — K624 Stenosis of anus and rectum: Secondary | ICD-10-CM | POA: Diagnosis not present

## 2023-03-31 ENCOUNTER — Other Ambulatory Visit: Payer: Self-pay

## 2023-04-02 DIAGNOSIS — H524 Presbyopia: Secondary | ICD-10-CM | POA: Diagnosis not present

## 2023-04-02 DIAGNOSIS — H401331 Pigmentary glaucoma, bilateral, mild stage: Secondary | ICD-10-CM | POA: Diagnosis not present

## 2023-04-29 NOTE — Progress Notes (Signed)
Sent message, via epic in basket, requesting orders in epic from surgeon.  

## 2023-04-30 NOTE — Patient Instructions (Signed)
SURGICAL WAITING ROOM VISITATION  Patients having surgery or a procedure may have no more than 2 support people in the waiting area - these visitors may rotate.    Children under the age of 37 must have an adult with them who is not the patient.  Due to an increase in RSV and influenza rates and associated hospitalizations, children ages 52 and under may not visit patients in [facility] hospitals.  If the patient needs to stay at the hospital during part of their recovery, the visitor guidelines for inpatient rooms apply. Pre-op nurse will coordinate an appropriate time for 1 support person to accompany patient in pre-op.  This support person may not rotate.    Please refer to the [facility] website for the visitor guidelines for Inpatients (after your surgery is over and you are in a regular room).       Your procedure is scheduled on: 05/14/23   Report to [facility] Main Entrance    Report to admitting at  10:15 AM   Call this number if you have problems the morning of surgery [phone]   Do not eat food :After Midnight.   After Midnight you may have the following liquids until ______ AM/ PM DAY OF SURGERY  Water Non-Citrus Juices (without pulp, NO RED-Apple, White grape, White cranberry) Black Coffee (NO MILK/CREAM OR CREAMERS, sugar ok)  Clear Tea (NO MILK/CREAM OR CREAMERS, sugar ok) regular and decaf                             Plain Jell-O (NO RED)                                           Fruit ices (not with fruit pulp, NO RED)                                     Popsicles (NO RED)                                                               Sports drinks like Gatorade (NO RED)              Drink 2 Ensure/G2 drinks AT 10:00 PM the night before surgery.        The day of surgery:  Drink ONE (1) Pre-Surgery Clear Ensure or G2 at AM the morning of surgery. Drink in one sitting. Do not sip.  This drink was given to you during your hospital  pre-op  appointment visit. Nothing else to drink after completing the  Pre-Surgery Clear Ensure or G2.          If you have questions, please contact your surgeon's office.   FOLLOW BOWEL PREP AND ANY ADDITIONAL PRE OP INSTRUCTIONS YOU RECEIVED FROM YOUR SURGEON'S OFFICE!!!     Oral Hygiene is also important to reduce your risk of infection.  Remember - BRUSH YOUR TEETH THE MORNING OF SURGERY WITH YOUR REGULAR TOOTHPASTE  DENTURES WILL BE REMOVED PRIOR TO SURGERY PLEASE DO NOT APPLY "Poly grip" OR ADHESIVES!!!   Stop all vitamins and herbal supplements 7 days before surgery.   Take these medicines the morning of surgery with A SIP OF WATER:              You may not have any metal on your body including hair pins, jewelry, and body piercing             Do not wear lotions, powders,cologne, or deodorant              Men may shave face and neck.   Do not bring valuables to the hospital. [facility] IS NOT             RESPONSIBLE   FOR VALUABLES.   Contacts, glasses, dentures or bridgework may not be worn into surgery.   Bring small overnight bag day of surgery.   DO NOT BRING YOUR HOME MEDICATIONS TO THE HOSPITAL. PHARMACY WILL DISPENSE MEDICATIONS LISTED ON YOUR MEDICATION LIST TO YOU DURING YOUR ADMISSION IN THE HOSPITAL!    Patients discharged on the day of surgery will not be allowed to drive home.  Someone NEEDS to stay with you for the first 24 hours after anesthesia.   Special Instructions: Bring a copy of your healthcare power of attorney and living will documents the day of surgery if you haven't scanned them before.              Please read over the following fact sheets you were given: IF YOU HAVE QUESTIONS ABOUT YOUR PRE-OP INSTRUCTIONS PLEASE CALL [phone] Rosey Bath   If you received a COVID test during your pre-op visit  it is requested that you wear a mask when out in public, stay away from anyone that may not be feeling well and notify  your surgeon if you develop symptoms. If you test positive for Covid or have been in contact with anyone that has tested positive in the last 10 days please notify you surgeon.    [place] - Preparing for Surgery Before surgery, you can play an important role.  Because skin is not sterile, your skin needs to be as free of germs as possible.  You can reduce the number of germs on your skin by washing with CHG (chlorahexidine gluconate) soap before surgery.  CHG is an antiseptic cleaner which kills germs and bonds with the skin to continue killing germs even after washing. Please DO NOT use if you have an allergy to CHG or antibacterial soaps.  If your skin becomes reddened/irritated stop using the CHG and inform your nurse when you arrive at Short Stay. Do not shave (including legs and underarms) for at least 48 hours prior to the first CHG shower.  You may shave your face/neck.  Please follow these instructions carefully:  1.  Shower with CHG Soap the night before surgery and the  morning of surgery.  2.  If you choose to wash your hair, wash your hair first as usual with your normal  shampoo.  3.  After you shampoo, rinse your hair and body thoroughly to remove the shampoo.                             4.  Use CHG as you would any other liquid soap.  You can  apply chg directly to the skin and wash.  Gently with a scrungie or clean washcloth.  5.  Apply the CHG Soap to your body ONLY FROM THE NECK DOWN.   Do   not use on face/ open                           Wound or open sores. Avoid contact with eyes, ears mouth and   genitals (private parts).                       Wash face,  Genitals (private parts) with your normal soap.             6.  Wash thoroughly, paying special attention to the area where your    surgery  will be performed.  7.  Thoroughly rinse your body with warm water from the neck down.  8.  DO NOT shower/wash with your normal soap after using and rinsing off the CHG Soap.                 9.  Pat yourself dry with a clean towel.            10.  Wear clean pajamas.            11.  Place clean sheets on your bed the night of your first shower and do not  sleep with pets. Day of Surgery : Do not apply any lotions/deodorants the morning of surgery.  Please wear clean clothes to the hospital/surgery center.  FAILURE TO FOLLOW THESE INSTRUCTIONS MAY RESULT IN THE CANCELLATION OF YOUR SURGERY  PATIENT SIGNATURE_________________________________  NURSE SIGNATURE__________________________________  ________________________________________________________________________

## 2023-04-30 NOTE — Progress Notes (Signed)
Please send preop orders for PST visit 05/03/23.

## 2023-04-30 NOTE — Progress Notes (Signed)
COVID Vaccine received:  []  No [x]  Yes Date of any COVID positive Test in last 90 days: No PCP - [facility] Cardiologist   Chest x-ray - 01/15/23 Epic EKG -  09/19/22 Epic Stress Test -  ECHO -  Cardiac Cath -   Bowel Prep - [x]  No  []   Yes ______  Pacemaker / ICD device [x]  No []  Yes   Spinal Cord Stimulator:[x]  No []  Yes       History of Sleep Apnea? [x]  No []  Yes   CPAP used?- [x]  No []  Yes    Does the patient monitor blood sugar?          [x]  No []  Yes  []  N/A  Patient has: [x]  NO Hx DM   []  Pre-DM                 []  DM1  []   DM2 Does patient have a [employer] or Dexacom? []  No []  Yes   Fasting Blood Sugar Ranges-  Checks Blood Sugar _____ times a day  GLP1 agonist / usual dose - no GLP1 instructions:  SGLT-2 inhibitors / usual dose - no SGLT-2 instructions:   Blood Thinner / Instructions:no Aspirin Instructions:no  Comments:   Activity level: Patient is able to climb a flight of stairs without difficulty; [x]  No CP  [x]  No SOB,   Patient can perform ADLs without assistance.   Anesthesia review:   Patient denies shortness of breath, fever, cough and chest pain at PAT appointment.  Patient verbalized understanding and agreement to the Pre-Surgical Instructions that were given to them at this PAT appointment. Patient was also educated of the need to review these PAT instructions again prior to his/her surgery.I reviewed the appropriate phone numbers to call if they have any and questions or concerns.

## 2023-05-03 ENCOUNTER — Ambulatory Visit: Payer: Self-pay | Admitting: Surgery

## 2023-05-03 ENCOUNTER — Encounter (HOSPITAL_COMMUNITY)
Admission: RE | Admit: 2023-05-03 | Discharge: 2023-05-03 | Disposition: A | Discharge status: Home / Self Care | Payer: No Typology Code available for payment source | Source: Ambulatory Visit | Attending: Surgery | Admitting: Surgery

## 2023-05-03 ENCOUNTER — Encounter (HOSPITAL_COMMUNITY): Payer: Self-pay

## 2023-05-03 ENCOUNTER — Other Ambulatory Visit: Payer: Self-pay

## 2023-05-03 VITALS — BP 123/85 | HR 72 | Temp 98.2°F | Resp 16 | Ht 69.0 in | Wt 175.0 lb

## 2023-05-03 DIAGNOSIS — Z01812 Encounter for preprocedural laboratory examination: Secondary | ICD-10-CM | POA: Insufficient documentation

## 2023-05-03 DIAGNOSIS — Z01818 Encounter for other preprocedural examination: Secondary | ICD-10-CM | POA: Diagnosis present

## 2023-05-03 HISTORY — DX: Myoneural disorder, unspecified: G70.9

## 2023-05-03 LAB — CBC
HCT: 47.1 % (ref 39.0–52.0)
Hemoglobin: 16.1 g/dL (ref 13.0–17.0)
MCH: 30.5 pg (ref 26.0–34.0)
MCHC: 34.2 g/dL (ref 30.0–36.0)
MCV: 89.2 fL (ref 80.0–100.0)
Platelets: 182 10*3/uL (ref 150–400)
RBC: 5.28 MIL/uL (ref 4.22–5.81)
RDW: 12.6 % (ref 11.5–15.5)
WBC: 4.2 10*3/uL (ref 4.0–10.5)
nRBC: 0 % (ref 0.0–0.2)

## 2023-05-04 ENCOUNTER — Encounter (HOSPITAL_COMMUNITY): Payer: Self-pay | Admitting: Hematology

## 2023-05-14 ENCOUNTER — Inpatient Hospital Stay (HOSPITAL_COMMUNITY)
Admission: RE | Admit: 2023-05-14 | Discharge: 2023-05-16 | DRG: 331 | Disposition: A | Discharge status: Home / Self Care | Payer: 59 | Source: Ambulatory Visit | Attending: Surgery | Admitting: Surgery

## 2023-05-14 ENCOUNTER — Inpatient Hospital Stay (HOSPITAL_COMMUNITY): Payer: 59 | Admitting: Anesthesiology

## 2023-05-14 ENCOUNTER — Encounter (HOSPITAL_COMMUNITY): Payer: Self-pay | Admitting: Surgery

## 2023-05-14 ENCOUNTER — Other Ambulatory Visit (HOSPITAL_COMMUNITY): Payer: Self-pay

## 2023-05-14 ENCOUNTER — Encounter (HOSPITAL_COMMUNITY)
Admission: RE | Disposition: A | Discharge status: Home / Self Care | Payer: Self-pay | Source: Ambulatory Visit | Attending: Surgery

## 2023-05-14 ENCOUNTER — Other Ambulatory Visit: Payer: Self-pay

## 2023-05-14 DIAGNOSIS — Z91040 Latex allergy status: Secondary | ICD-10-CM | POA: Diagnosis not present

## 2023-05-14 DIAGNOSIS — Z452 Encounter for adjustment and management of vascular access device: Secondary | ICD-10-CM | POA: Diagnosis not present

## 2023-05-14 DIAGNOSIS — Z932 Ileostomy status: Secondary | ICD-10-CM

## 2023-05-14 DIAGNOSIS — Z800 Family history of malignant neoplasm of digestive organs: Secondary | ICD-10-CM

## 2023-05-14 DIAGNOSIS — Z432 Encounter for attention to ileostomy: Secondary | ICD-10-CM | POA: Diagnosis not present

## 2023-05-14 DIAGNOSIS — Z85048 Personal history of other malignant neoplasm of rectum, rectosigmoid junction, and anus: Secondary | ICD-10-CM | POA: Diagnosis not present

## 2023-05-14 DIAGNOSIS — Z79899 Other long term (current) drug therapy: Secondary | ICD-10-CM | POA: Diagnosis not present

## 2023-05-14 DIAGNOSIS — Z9221 Personal history of antineoplastic chemotherapy: Secondary | ICD-10-CM | POA: Diagnosis not present

## 2023-05-14 DIAGNOSIS — Z885 Allergy status to narcotic agent status: Secondary | ICD-10-CM | POA: Diagnosis not present

## 2023-05-14 DIAGNOSIS — Z933 Colostomy status: Secondary | ICD-10-CM | POA: Diagnosis not present

## 2023-05-14 DIAGNOSIS — Z8249 Family history of ischemic heart disease and other diseases of the circulatory system: Secondary | ICD-10-CM

## 2023-05-14 DIAGNOSIS — Z9889 Other specified postprocedural states: Principal | ICD-10-CM | POA: Diagnosis present

## 2023-05-14 HISTORY — PX: ILEOSTOMY CLOSURE: SHX1784

## 2023-05-14 HISTORY — PX: PORT-A-CATH REMOVAL: SHX5289

## 2023-05-14 SURGERY — CLOSURE, ILEOSTOMY
Anesthesia: General

## 2023-05-14 MED ORDER — LACTATED RINGERS IV SOLN
INTRAVENOUS | Status: DC
  Administered 2023-05-14: 75 mL/h via INTRAVENOUS

## 2023-05-14 MED ORDER — PHENYLEPHRINE HCL (PRESSORS) 10 MG/ML IV SOLN
INTRAVENOUS | Status: DC | PRN
  Administered 2023-05-14: 80 ug via INTRAVENOUS

## 2023-05-14 MED ORDER — BUPIVACAINE LIPOSOME 1.3 % IJ SUSP
INTRAMUSCULAR | Status: DC | PRN
  Administered 2023-05-14: 20 mL

## 2023-05-14 MED ORDER — LIDOCAINE HCL 1 % IJ SOLN
INTRAMUSCULAR | Status: AC
  Filled 2023-05-14: qty 20

## 2023-05-14 MED ORDER — ONDANSETRON HCL 4 MG/2ML IJ SOLN
INTRAMUSCULAR | Status: DC | PRN
  Administered 2023-05-14: 4 mg via INTRAVENOUS

## 2023-05-14 MED ORDER — DEXAMETHASONE SODIUM PHOSPHATE 10 MG/ML IJ SOLN
INTRAMUSCULAR | Status: AC
  Filled 2023-05-14: qty 1

## 2023-05-14 MED ORDER — BUPIVACAINE LIPOSOME 1.3 % IJ SUSP: 20.0000 mL | Freq: Once | INTRAMUSCULAR | Status: DC

## 2023-05-14 MED ORDER — LIDOCAINE HCL (CARDIAC) PF 100 MG/5ML IV SOSY
PREFILLED_SYRINGE | INTRAVENOUS | Status: DC | PRN
  Administered 2023-05-14: 100 mg via INTRAVENOUS

## 2023-05-14 MED ORDER — FENTANYL CITRATE PF 50 MCG/ML IJ SOSY
PREFILLED_SYRINGE | INTRAMUSCULAR | Status: AC
  Filled 2023-05-14: qty 1

## 2023-05-14 MED ORDER — HEPARIN SODIUM (PORCINE) 5000 UNIT/ML IJ SOLN
5000.0000 [IU] | Freq: Three times a day (TID) | INTRAMUSCULAR | Status: DC
  Administered 2023-05-14 – 2023-05-16 (×5): 5000 [IU] via SUBCUTANEOUS
  Filled 2023-05-14 (×5): qty 1

## 2023-05-14 MED ORDER — ALVIMOPAN 12 MG PO CAPS
12.0000 mg | ORAL_CAPSULE | ORAL | Status: AC
  Administered 2023-05-14: 12 mg via ORAL
  Filled 2023-05-14: qty 1

## 2023-05-14 MED ORDER — ALUM & MAG HYDROXIDE-SIMETH 200-200-20 MG/5ML PO SUSP: 30.0000 mL | Freq: Four times a day (QID) | ORAL | Status: DC | PRN

## 2023-05-14 MED ORDER — GLYCOPYRROLATE 0.2 MG/ML IJ SOLN
INTRAMUSCULAR | Status: DC | PRN
  Administered 2023-05-14 (×2): .1 mg via INTRAVENOUS

## 2023-05-14 MED ORDER — FENTANYL CITRATE (PF) 100 MCG/2ML IJ SOLN
INTRAMUSCULAR | Status: DC | PRN
  Administered 2023-05-14: 75 ug via INTRAVENOUS
  Administered 2023-05-14 (×2): 50 ug via INTRAVENOUS
  Administered 2023-05-14: 75 ug via INTRAVENOUS

## 2023-05-14 MED ORDER — BUPIVACAINE LIPOSOME 1.3 % IJ SUSP
INTRAMUSCULAR | Status: AC
  Filled 2023-05-14: qty 20

## 2023-05-14 MED ORDER — HEPARIN SODIUM (PORCINE) 5000 UNIT/ML IJ SOLN
5000.0000 [IU] | Freq: Once | INTRAMUSCULAR | Status: AC
  Administered 2023-05-14: 5000 [IU] via SUBCUTANEOUS
  Filled 2023-05-14: qty 1

## 2023-05-14 MED ORDER — CHLORHEXIDINE GLUCONATE CLOTH 2 % EX PADS: 6.0000 | MEDICATED_PAD | Freq: Once | CUTANEOUS | Status: DC

## 2023-05-14 MED ORDER — MIDAZOLAM HCL 5 MG/5ML IJ SOLN
INTRAMUSCULAR | Status: DC | PRN
  Administered 2023-05-14: 2 mg via INTRAVENOUS

## 2023-05-14 MED ORDER — TRAMADOL HCL 50 MG PO TABS
50.0000 mg | ORAL_TABLET | Freq: Four times a day (QID) | ORAL | 0 refills | Status: AC | PRN
  Filled 2023-05-14: qty 15, 4d supply, fill #0

## 2023-05-14 MED ORDER — MIDAZOLAM HCL 2 MG/2ML IJ SOLN
INTRAMUSCULAR | Status: AC
  Filled 2023-05-14: qty 2

## 2023-05-14 MED ORDER — TRAMADOL HCL 50 MG PO TABS
50.0000 mg | ORAL_TABLET | Freq: Four times a day (QID) | ORAL | Status: DC | PRN
  Administered 2023-05-14 – 2023-05-15 (×5): 50 mg via ORAL
  Filled 2023-05-14 (×5): qty 1

## 2023-05-14 MED ORDER — BUPIVACAINE-EPINEPHRINE 0.25% -1:200000 IJ SOLN
INTRAMUSCULAR | Status: AC
  Filled 2023-05-14: qty 1

## 2023-05-14 MED ORDER — ORAL CARE MOUTH RINSE: 15.0000 mL | Freq: Once | OROMUCOSAL | Status: AC

## 2023-05-14 MED ORDER — LACTATED RINGERS IV SOLN: INTRAVENOUS | Status: DC

## 2023-05-14 MED ORDER — ACETAMINOPHEN 500 MG PO TABS
1000.0000 mg | ORAL_TABLET | ORAL | Status: AC
  Administered 2023-05-14: 1000 mg via ORAL
  Filled 2023-05-14: qty 2

## 2023-05-14 MED ORDER — ONDANSETRON HCL 4 MG/2ML IJ SOLN
4.0000 mg | Freq: Four times a day (QID) | INTRAMUSCULAR | Status: DC | PRN
  Administered 2023-05-15: 4 mg via INTRAVENOUS
  Filled 2023-05-14: qty 2

## 2023-05-14 MED ORDER — SODIUM CHLORIDE 0.9 % IV SOLN
2.0000 g | INTRAVENOUS | Status: AC
  Administered 2023-05-14: 2 g via INTRAVENOUS
  Filled 2023-05-14: qty 2

## 2023-05-14 MED ORDER — PHENYLEPHRINE 80 MCG/ML (10ML) SYRINGE FOR IV PUSH (FOR BLOOD PRESSURE SUPPORT)
PREFILLED_SYRINGE | INTRAVENOUS | Status: AC
  Filled 2023-05-14: qty 10

## 2023-05-14 MED ORDER — CHLORHEXIDINE GLUCONATE 0.12 % MT SOLN
15.0000 mL | Freq: Once | OROMUCOSAL | Status: AC
  Administered 2023-05-14: 15 mL via OROMUCOSAL

## 2023-05-14 MED ORDER — ESCITALOPRAM OXALATE 20 MG PO TABS
10.0000 mg | ORAL_TABLET | Freq: Every day | ORAL | Status: DC
  Administered 2023-05-15: 10 mg via ORAL
  Filled 2023-05-14 (×2): qty 1

## 2023-05-14 MED ORDER — DROPERIDOL 2.5 MG/ML IJ SOLN: 0.6250 mg | Freq: Once | INTRAMUSCULAR | Status: DC | PRN

## 2023-05-14 MED ORDER — ONDANSETRON HCL 4 MG/2ML IJ SOLN
INTRAMUSCULAR | Status: AC
  Filled 2023-05-14: qty 2

## 2023-05-14 MED ORDER — ONDANSETRON HCL 4 MG PO TABS: 4.0000 mg | ORAL_TABLET | Freq: Four times a day (QID) | ORAL | Status: DC | PRN

## 2023-05-14 MED ORDER — SIMETHICONE 80 MG PO CHEW
40.0000 mg | CHEWABLE_TABLET | Freq: Four times a day (QID) | ORAL | Status: DC | PRN
  Administered 2023-05-15: 40 mg via ORAL
  Filled 2023-05-14: qty 1

## 2023-05-14 MED ORDER — HYDRALAZINE HCL 20 MG/ML IJ SOLN: 10.0000 mg | INTRAMUSCULAR | Status: DC | PRN

## 2023-05-14 MED ORDER — 0.9 % SODIUM CHLORIDE (POUR BTL) OPTIME
TOPICAL | Status: DC | PRN
  Administered 2023-05-14: 2000 mL

## 2023-05-14 MED ORDER — SUGAMMADEX SODIUM 200 MG/2ML IV SOLN
INTRAVENOUS | Status: DC | PRN
Start: 2023-05-14 — End: 2023-05-14
  Administered 2023-05-14: 400 mg via INTRAVENOUS

## 2023-05-14 MED ORDER — PROPOFOL 10 MG/ML IV BOLUS
INTRAVENOUS | Status: DC | PRN
  Administered 2023-05-14: 150 mg via INTRAVENOUS

## 2023-05-14 MED ORDER — GLYCOPYRROLATE 0.2 MG/ML IJ SOLN
INTRAMUSCULAR | Status: AC
  Filled 2023-05-14: qty 1

## 2023-05-14 MED ORDER — PROPOFOL 10 MG/ML IV BOLUS
INTRAVENOUS | Status: AC
  Filled 2023-05-14: qty 20

## 2023-05-14 MED ORDER — DIPHENHYDRAMINE HCL 50 MG/ML IJ SOLN: 12.5000 mg | Freq: Four times a day (QID) | INTRAMUSCULAR | Status: DC | PRN

## 2023-05-14 MED ORDER — ROCURONIUM BROMIDE 10 MG/ML (PF) SYRINGE
PREFILLED_SYRINGE | INTRAVENOUS | Status: AC
  Filled 2023-05-14: qty 10

## 2023-05-14 MED ORDER — HYDROMORPHONE HCL 1 MG/ML IJ SOLN
0.5000 mg | INTRAMUSCULAR | Status: DC | PRN
  Filled 2023-05-14: qty 0.5

## 2023-05-14 MED ORDER — ENSURE PRE-SURGERY PO LIQD
592.0000 mL | Freq: Once | ORAL | Status: DC
  Filled 2023-05-14: qty 592

## 2023-05-14 MED ORDER — FENTANYL CITRATE (PF) 250 MCG/5ML IJ SOLN
INTRAMUSCULAR | Status: AC
  Filled 2023-05-14: qty 5

## 2023-05-14 MED ORDER — ACETAMINOPHEN 500 MG PO TABS
1000.0000 mg | ORAL_TABLET | Freq: Four times a day (QID) | ORAL | Status: DC
  Administered 2023-05-14 – 2023-05-16 (×7): 1000 mg via ORAL
  Filled 2023-05-14 (×7): qty 2

## 2023-05-14 MED ORDER — ENSURE PRE-SURGERY PO LIQD
296.0000 mL | Freq: Once | ORAL | Status: DC
  Filled 2023-05-14: qty 296

## 2023-05-14 MED ORDER — FENTANYL CITRATE PF 50 MCG/ML IJ SOSY
25.0000 ug | PREFILLED_SYRINGE | INTRAMUSCULAR | Status: DC | PRN
  Administered 2023-05-14 (×2): 50 ug via INTRAVENOUS

## 2023-05-14 MED ORDER — FENTANYL CITRATE PF 50 MCG/ML IJ SOSY
PREFILLED_SYRINGE | INTRAMUSCULAR | Status: AC
  Filled 2023-05-14: qty 2

## 2023-05-14 MED ORDER — ENSURE SURGERY PO LIQD: 237.0000 mL | Freq: Two times a day (BID) | ORAL | Status: DC

## 2023-05-14 MED ORDER — BUPIVACAINE-EPINEPHRINE (PF) 0.25% -1:200000 IJ SOLN
INTRAMUSCULAR | Status: DC | PRN
  Administered 2023-05-14: 30 mL via PERINEURAL

## 2023-05-14 MED ORDER — ALVIMOPAN 12 MG PO CAPS
12.0000 mg | ORAL_CAPSULE | Freq: Two times a day (BID) | ORAL | Status: DC
  Filled 2023-05-14: qty 1

## 2023-05-14 MED ORDER — IBUPROFEN 400 MG PO TABS: 600.0000 mg | ORAL_TABLET | Freq: Four times a day (QID) | ORAL | Status: DC | PRN

## 2023-05-14 MED ORDER — DIPHENHYDRAMINE HCL 12.5 MG/5ML PO ELIX: 12.5000 mg | ORAL_SOLUTION | Freq: Four times a day (QID) | ORAL | Status: DC | PRN

## 2023-05-14 MED ORDER — ROCURONIUM BROMIDE 100 MG/10ML IV SOLN
INTRAVENOUS | Status: DC | PRN
  Administered 2023-05-14: 20 mg via INTRAVENOUS
  Administered 2023-05-14: 50 mg via INTRAVENOUS

## 2023-05-14 MED ORDER — ACETAMINOPHEN 10 MG/ML IV SOLN: 1000.0000 mg | Freq: Once | INTRAVENOUS | Status: DC | PRN

## 2023-05-14 MED ORDER — OXYCODONE HCL 5 MG PO TABS: 5.0000 mg | ORAL_TABLET | Freq: Once | ORAL | Status: DC | PRN

## 2023-05-14 MED ORDER — OXYCODONE HCL 5 MG/5ML PO SOLN: 5.0000 mg | Freq: Once | ORAL | Status: DC | PRN

## 2023-05-14 MED ORDER — DEXAMETHASONE SODIUM PHOSPHATE 4 MG/ML IJ SOLN
INTRAMUSCULAR | Status: DC | PRN
Start: 2023-05-14 — End: 2023-05-14
  Administered 2023-05-14: 4 mg via INTRAVENOUS

## 2023-05-14 SURGICAL SUPPLY — 53 items
ADH SKN CLS APL DERMABOND .7 (GAUZE/BANDAGES/DRESSINGS) ×1
APL PRP STRL LF DISP 70% ISPRP (MISCELLANEOUS) ×1
BAG COUNTER SPONGE SURGICOUNT (BAG) IMPLANT
BAG SPNG CNTER NS LX DISP (BAG)
BLADE HEX COATED 2.75 (ELECTRODE) ×1 IMPLANT
CHLORAPREP W/TINT 26 (MISCELLANEOUS) ×1 IMPLANT
COVER MAYO STAND STRL (DRAPES) ×1 IMPLANT
DERMABOND ADVANCED .7 DNX12 (GAUZE/BANDAGES/DRESSINGS) IMPLANT
DRAPE LAPAROSCOPIC ABDOMINAL (DRAPES) ×1 IMPLANT
DRAPE UTILITY XL STRL (DRAPES) IMPLANT
DRAPE WARM FLUID 44X44 (DRAPES) ×1 IMPLANT
DRSG OPSITE POSTOP 4X10 (GAUZE/BANDAGES/DRESSINGS) IMPLANT
DRSG OPSITE POSTOP 4X6 (GAUZE/BANDAGES/DRESSINGS) IMPLANT
DRSG OPSITE POSTOP 4X8 (GAUZE/BANDAGES/DRESSINGS) IMPLANT
DRSG TEGADERM 6X8 (GAUZE/BANDAGES/DRESSINGS) IMPLANT
DRSG TELFA 3X8 NADH STRL (GAUZE/BANDAGES/DRESSINGS) IMPLANT
ELECT REM PT RETURN 15FT ADLT (MISCELLANEOUS) ×1 IMPLANT
GAUZE SPONGE 4X4 12PLY STRL (GAUZE/BANDAGES/DRESSINGS) ×1 IMPLANT
GLOVE BIOGEL PI IND STRL 7.0 (GLOVE) ×1 IMPLANT
GLOVE SURG SS PI 7.0 STRL IVOR (GLOVE) ×1 IMPLANT
GOWN STRL REUS W/ TWL XL LVL3 (GOWN DISPOSABLE) IMPLANT
GOWN STRL REUS W/TWL XL LVL3 (GOWN DISPOSABLE)
HANDLE SUCTION POOLE (INSTRUMENTS) ×1 IMPLANT
HOLDER FOLEY CATH W/STRAP (MISCELLANEOUS) IMPLANT
KIT BASIN OR (CUSTOM PROCEDURE TRAY) ×1 IMPLANT
KIT TURNOVER KIT A (KITS) IMPLANT
LIGASURE IMPACT 36 18CM CVD LR (INSTRUMENTS) IMPLANT
MANIFOLD NEPTUNE II (INSTRUMENTS) ×1 IMPLANT
PACK GENERAL/GYN (CUSTOM PROCEDURE TRAY) ×1 IMPLANT
RELOAD PROXIMATE 75MM BLUE (ENDOMECHANICALS) ×2
RELOAD STAPLE 75 3.8 BLU REG (ENDOMECHANICALS) IMPLANT
STAPLER GUN LINEAR PROX 60 (STAPLE) IMPLANT
STAPLER PROXIMATE 75MM BLUE (STAPLE) IMPLANT
SUCTION POOLE HANDLE (INSTRUMENTS) ×1
SUT NOVA NAB DX-16 0-1 5-0 T12 (SUTURE) IMPLANT
SUT NOVA NAB GS-21 0 18 T12 DT (SUTURE) ×2 IMPLANT
SUT PROLENE 2 0 BLUE (SUTURE) IMPLANT
SUT SILK 2 0 (SUTURE) ×1
SUT SILK 2 0 SH CR/8 (SUTURE) ×1 IMPLANT
SUT SILK 2-0 18XBRD TIE 12 (SUTURE) ×1 IMPLANT
SUT SILK 3 0 (SUTURE) ×1
SUT SILK 3 0 SH CR/8 (SUTURE) ×1 IMPLANT
SUT SILK 3-0 18XBRD TIE 12 (SUTURE) ×1 IMPLANT
SUT VIC AB 2-0 SH 18 (SUTURE) IMPLANT
SUT VIC AB 2-0 SH 27 (SUTURE) ×2
SUT VIC AB 2-0 SH 27X BRD (SUTURE) ×2 IMPLANT
SUT VIC AB 2-0 UR6 27 (SUTURE) IMPLANT
SUT VIC AB 4-0 PS2 18 (SUTURE) ×1 IMPLANT
SUT VLOC BARB 180 ABS3/0GR12 (SUTURE) ×2
SUTURE VLOC BRB 180 ABS3/0GR12 (SUTURE) IMPLANT
TOWEL OR 17X26 10 PK STRL BLUE (TOWEL DISPOSABLE) ×2 IMPLANT
TOWEL OR NON WOVEN STRL DISP B (DISPOSABLE) ×2 IMPLANT
YANKAUER SUCT BULB TIP NO VENT (SUCTIONS) ×1 IMPLANT

## 2023-05-14 NOTE — Anesthesia Preprocedure Evaluation (Signed)
Anesthesia Evaluation  Patient identified by MRN, date of birth, ID band Patient awake    Reviewed: Allergy & Precautions, H&P , NPO status , Patient's Chart, lab work & pertinent test results  Airway Mallampati: II  TM Distance: >3 FB Neck ROM: Full    Dental no notable dental hx. (+) Teeth Intact, Dental Advisory Given   Pulmonary former smoker   Pulmonary exam normal breath sounds clear to auscultation       Cardiovascular negative cardio ROS Normal cardiovascular exam Rhythm:Regular Rate:Normal     Neuro/Psych  PSYCHIATRIC DISORDERS Anxiety Depression    Chemotherapy induced neuropathy    GI/Hepatic Neg liver ROS,,,Rectal cancer   Endo/Other  negative endocrine ROS    Renal/GU negative Renal ROS  negative genitourinary   Musculoskeletal negative musculoskeletal ROS (+)    Abdominal   Peds negative pediatric ROS (+)  Hematology negative hematology ROS (+) Blood dyscrasia, anemia   Anesthesia Other Findings   Reproductive/Obstetrics negative OB ROS                              Anesthesia Physical Anesthesia Plan  ASA: 2  Anesthesia Plan: General   Post-op Pain Management:    Induction: Intravenous  PONV Risk Score and Plan: 2 and Treatment may vary due to age or medical condition, Propofol infusion and Ondansetron  Airway Management Planned: Oral ETT  Additional Equipment:   Intra-op Plan:   Post-operative Plan:   Informed Consent: I have reviewed the patients History and Physical, chart, labs and discussed the procedure including the risks, benefits and alternatives for the proposed anesthesia with the patient or authorized representative who has indicated his/her understanding and acceptance.     Dental advisory given  Plan Discussed with: CRNA and Anesthesiologist  Anesthesia Plan Comments:         Anesthesia Quick Evaluation

## 2023-05-14 NOTE — Op Note (Signed)
05/14/2023  2:12 PM  PATIENT:  Troy Dillon  52 y.o. male  Patient Care Team: Practice, [facility] as PCP - General Branch, Alben Spittle, MD as PCP - Cardiology (Cardiology) Doreatha Massed, MD as Medical Oncologist (Medical Oncology) Therese Sarah, RN as Oncology Nurse Navigator (Medical Oncology)  PRE-OPERATIVE DIAGNOSIS:  Ileostomy status, history of rectal cancer  POST-OPERATIVE DIAGNOSIS:  Same  PROCEDURE:   Takedown of diverting loop ileostomy Removal of port-a-catheter  SURGEON:  Stephanie Coup. Cliffton Asters, MD  ASSISTANT: Melody Haver, MD  ANESTHESIA:   local and general  COUNTS:  Sponge, needle and instrument counts were reported correct x2 at the conclusion of the operation.  EBL: 50 mL  DRAINS: None  SPECIMEN: Ileostomy  COMPLICATIONS: None  FINDINGS: Standard right sided port-a-catheter removed uneventfully.  Loop ileostomy taken down without difficulty.  DISPOSITION: PACU in satisfactory condition  DESCRIPTION: The patient was identified in preop holding and taken to the OR where he was placed on the operating room table. SCDs were placed. General endotracheal anesthesia was induced without difficulty.  Pressure points were then evaluated and padded.  He was then prepped and draped in the usual sterile fashion. A surgical timeout was performed indicating the correct patient, procedure, positioning and need for preoperative antibiotics.   We first began with the clean as part of the procedure it was removing his portacatheter.  The right chest was evaluated and the portacatheter was visible in the right chest.  An incision was made incorporating the prior scar.  Subcutaneous tissue was divided with electrocautery.  The portacatheter was read identified in the pocket in size.  The portacatheter is able to be brought up into the wound.  There is a single apparent Prolene suture which was cut and removed.  The port then freely moved out of the pocket.  Pressure  was then applied to the right IJ insertion site by IR anesthetist.  The portacatheter was then carefully removed without any difficulty.  The portacatheter was inspected and the tubing and port itself are normal in appearance without any evident missing parts.  This was passed off and ultimately discarded.  The site where the tubing tunneled into the portacatheter pocket was occluded using a 3-0 Vicryl figure-of-eight suture.  The pocket was irrigated.  Hemostasis verified.  The incision was then closed in layers using a 3-0 Vicryl deep dermal suture.  The skin was then closed using a running 4-0 Monocryl subcuticular stitch.  The wound was washed and dried.  Dermabond is applied.  A towel was placed over this after the Dermabond dried.  The ileostomy is evaluated in the right lower quadrant.  This was circumferentially incised at the mucocutaneous junction.  Subcutaneous tissue was then divided with electrocautery.  The proximal and distal limbs of the ileostomy were then carefully mobilized sharply using Metzenbaum scissors.  We were able to do this all the way down to the level the rectus fascia.  With an able to grasp the rectus fascia and separate the proximal distal limbs as well as the mesentery from this.  In doing so, were able to enter into the peritoneal cavity.  The peritoneal cavity is then carefully explored and there are no palpable adhesions around where the ostomy was.  The ostomy has been fully mobilized.  Each proximal distal limbs are then inspected and noted to be without any evident serosal injuries.  The proximal and distal limbs were then divided using a GIA blue load stapler after creating a window  in the associated mesentery just to be on the ostomy itself.  The ileostomy mesentery was then ligated and divided maintaining a plane close to the actual stoma using a LigaSure.  The ileostomy was passed off as specimen.  Attention was then turned to creating the enteroenterostomy.  An  enterotomy was created both the proximal distal limbs and orientation was confirmed such a there is no twisting of either limb.  A 75 mm GIA blue stapler was then used to create a side-to-side, functional end-to-end enteroenterostomy.  The staple line is inspected and found to have well-formed staples and hemostatic.  The common enterotomy was then closed using running 3-0 V-Loc sutures in a Connell manner.  The closure was then carefully inspected and found to have no gaps with well apposition and edges.  Both limbs are pink in color.  A 3-0 silk stitches placed at the apex of the anastomosis.  We then incised the fascia cephalad carefully in order to facilitate placing this back into the abdomen in an atraumatic manner.  The mesenteric defect is closed using 3-0 silk sutures.  The bowel was able to be reduced into the peritoneal without difficulty.  The abdomen is inspected and hemostasis verified.  Attention is then turned to fascial closure.  Subcutaneous flaps were carefully raised circumferentially so that were able to expose the rectus fascia.  The rectus fascia is then closed using 2 running #1 PDS sutures.  Local anesthetic is infiltrated at the closure site.  All sponge, needle, and instrument counts were reported correct.  The wound is irrigated.  Hemostasis verified.  A 2-0 Vicryl suture was then used to create a pursestring type stitch to cinch down the former ostomy skin.  The wound is then gently packed with moist Kerlix.  Additional 4 x 4's followed by an ABD pad are placed over this.  He is then awakened from anesthesia, extubated and transferred to a stretcher for transport to recovery in satisfactory condition.

## 2023-05-14 NOTE — H&P (Signed)
CC: Here today for surgery  HPI: Troy Dillon is an 52 y.o. male with history of , whom is seen in the office today as a referral by Dr. Seymour Bars for evaluation of rectal cancer.   Cscope 03/2010 - Dr. Karilyn Cota - redundant hepatic flexure; 2 polyps from sigmoid removed. Tiny polyp at rectosigmoid jxn coagulated using snare tip.  Cscope 06/08/22 Dr. Marcha Solders- Impression:  - Malignant partially obstructing tumor in the rectum.  Biopsied. Tattooed.  - One large polyp at 40 cm proximal to the anus,  removed with a hot snare. Resected and retrieved.  Clips were placed.  - Three small polyps in the cecum, removed with a hot  snare. Resected and retrieved.   Mass in rectum showed adenocarcinoma by report of Dr. Mitzi Hansen. He met with Dr. Marcha Solders at [facility].  MRI Pelvis 06/12/22 - rectal mass cmriT3cN1 extending up into sigmoid; +EMVI. Mass is 7 cm from IAS. Appears just above reflection in mid and upper rectum.  CT CAP 06/12/22 -  1. Circumferential wall thickening of the upper/mid rectum may reflect patient's known rectal mass. 2. Mildly enlarged high perirectal lymph nodes measure up to 6 mm in short axis, nonspecific but suspicious for locoregional nodal disease. 3. No evidence of distant metastatic disease within the chest, abdomen or pelvis.  Following with Drs. Moody and [place].  He reports his colonoscopy was done for rectal bleeding which is also the reason for his colonoscopy back in 2011. He denies abdominal pain, nausea, vomiting, diarrhea.  Genetics negative  MRI Pelvis 10/13/22  *Decreased eccentric high rectal/low sigmoid wall thickening compatible with treatment response. Evaluation for restricted diffusion is limited on this examination secondary to technique however there is probable mild reduced ADC and increased DWI signal within portions of the tumor suggestive of residual disease. Consider further evaluation with colonoscopy. *Decreased size of the high mesorectal or  superior rectal lymph node.  He has met back with medical and now radiation oncology. Has been doing well. No complaints at present. He is here today with his wife.  Completed cXRT 12/18/22 CT CAP 01/15/2023 1. Significant interval decrease in the concentric high rectal/low sigmoid wall thickening. 2. Decreased size of the mesorectal/sigmoid mesentery lymph nodes. 3. Clusters of micro nodularity in the anterior right upper lobe and new 2-3 mm right lower lobe pulmonary nodule, favored to be infectious/inflammatory in etiology. Suggest short-term interval follow-up dedicated chest CT to ensure resolution. 4. No definitive evidence of distant metastatic disease in the chest, abdomen or pelvis.  INTERVAL HX OR 02/17/23 Robotic assisted low anterior resection with double stapled colorectal anastomosis Diagnostic flexible sigmoidoscopy (necessary to confirm location of rectal cancer) Intraoperative assessment of perfusion using ICG fluorescence imaging Bilateral transversus abdominus plane (TAP) blocks  FINDINGS: ?Early/very small right inguinal hernia. Tattoo just above the peritoneal reflection. No obvious mass. Diagnostic flexible sigmoidoscopy was therefore carried out to facilitate identification of the location of the lesion. This is located on the proximal valve of [name]. No evident metastatic disease on visceral parietal peritoneum or liver. Robotic LAR carried out. A well perfused, tension free, hemostatic, air tight 31 mm EEA colorectal anastomosis fashioned 8 cm from the anal verge by flexible sigmoidoscopy.  PATH A. COLON, RECTOSIGMOID, RESECTION:  - Residual invasive moderately differentiated adenocarcinoma,  discontinuously involving a fibrotic area of about 2.2 cm  - Carcinoma invades into pericolonic soft tissue  - Resection margins are negative for carcinoma  - Metastatic carcinoma involving two of twenty-two lymph nodes (2/22)  -  Therapy-related changes  - See oncology  table   B. FINAL DISTAL MARGIN:  - Colonic donut with no specific histologic changes  - Negative for carcinoma   He has been doing well. Denies any complaints at present. He has been doing quite well. Ostomy is working well. Output is less than 1.2 L/day. Consistency is toothpaste. No significant abdominal pain. Some incisional soreness. Good appetite.  He denies any changes in health or health history since we met in the office. No new medications/allergies. He states he is ready for surgery today.  Past Medical History:  Diagnosis Date   Anemia    Anxiety    Depression    Neuromuscular disorder (HCC)    Neuropathy d/t chemo   Rectal cancer (HCC) 06/08/2022    Past Surgical History:  Procedure Laterality Date   COLONOSCOPY  06/08/2022   EYE SURGERY     FLEXIBLE SIGMOIDOSCOPY N/A 02/17/2023   Procedure: FLEXIBLE SIGMOIDOSCOPY;  Surgeon: Andria Meuse, MD;  Location: [facility];  Service: General;  Laterality: N/A;   FLEXIBLE SIGMOIDOSCOPY N/A 03/22/2023   Procedure: Arnell Sieving;  Surgeon: Andria Meuse, MD;  Location: [facility];  Service: General;  Laterality: N/A;   FRACTURE SURGERY     Right Femur- Cables in bone remain in place, rod removed in 2000   IR IMAGING GUIDED PORT INSERTION  06/22/2022   ORTHOPEDIC SURGERY     XI ROBOTIC ASSISTED LOWER ANTERIOR RESECTION Bilateral 02/17/2023   Procedure: XI ROBOTIC ASSISTED LOWER ANTERIOR RESECTION, DIVERTING LOOP ILEOSTOMY, BILATERAL TAP BLOCK, TISSUE PERFUSION ASSESSMENT VIA FIREFLY INJECTION;  Surgeon: Andria Meuse, MD;  Location: [facility];  Service: General;  Laterality: Bilateral;    Family History  Problem Relation Age of Onset   Cancer Mother 77 - 67       GYN malignancy details unknown, TAH/BSO   Colon cancer Father 23   Heart attack Father    Colon cancer Paternal Aunt 77   Colon cancer Paternal Grandmother 21    Social:  reports that he quit smoking about 21 years ago. His smoking use  included cigarettes. He started smoking about 36 years ago. He has a 15 pack-year smoking history. He has quit using smokeless tobacco.  His smokeless tobacco use included snuff. He reports current alcohol use. He reports that he does not use drugs.  Allergies:  Allergies  Allergen Reactions   Hydrocodone Itching   Latex     Eye irritation     Medications: I have reviewed the patient's current medications.  No results found for this or any previous visit (from the past 48 hour(s)).  No results found.   PE Blood pressure 127/79, pulse (!) 57, temperature 97.9 F (36.6 C), temperature source Oral, resp. rate 16, height 5\' 9"  (1.753 m), weight 79.4 kg, SpO2 96%. Constitutional: NAD; conversant Eyes: Moist conjunctiva; no lid lag; anicteric Lungs: Normal respiratory effort CV: RRR Psychiatric: Appropriate affect  No results found for this or any previous visit (from the past 48 hour(s)).  No results found.  A/P: Troy Dillon is an 52 y.o. male with cmriT3cN1 rectal adenocarcinoma, mid rectum  CT CAP 05/2022 no evidence of metastatic disease  cmriT3N1 mid to proximal rectal cancer  CT CAP 01/15/23 - clusters of micronodularity in RUL and 2-3 mm nodule RLL - surveillance as per Dr. Ellin Saba  Robotic LAR/DLI 02/17/23 PATH ypT3N1b margin negative moderately differentiated adenocarcinoma  Flex sig 03/22/23 -  - Patent end-to-end colo-colonic anastomosis, characterized by healthy appearing  mucosa. No specimens collected. No evident 'stricturing'  GGE 03/30/23 - clear  -He would like the portacatheter to be removed at the time of ileostomy reversal if at all possible. I have discussed with Dr. Ellin Saba via epic staff message 03/29/2023. He has cleared him for port removal at time of ileostomy reversal.  -The anatomy and physiology of the GI tract was reviewed with him as it pertains to his current status with a loop ileostomy. -We discussed the technique of loop ileostomy  reversal, removal of port-a-catheter  -The planned procedure, material risks (including, but not limited to, pain, bleeding, infection, scarring, need for blood transfusion, damage to surrounding structures- blood vessels/nerves/viscus/organs, leak from anastomosis, need for additional procedures, low anterior resection syndrome (LARS) = increased fecal urgency and/or frequency, scenarios where a stoma may be necessary and where it may be permanent, worsening of pre-existing medical conditions, chronic diarrhea, constipation secondary to narcotic use, hernia, recurrence, pneumonia, heart attack, stroke, death) benefits and alternatives to surgery were discussed at length. The patient's questions were answered to his satisfaction, he voiced understanding and elected to proceed with surgery. Additionally, we discussed typical postoperative expectations and the recovery process.   Marin Olp, MD [facility]

## 2023-05-14 NOTE — Plan of Care (Signed)
Problem: Education: Goal: Understanding of discharge needs will improve Outcome: Progressing   Problem: Activity: Goal: Ability to tolerate increased activity will improve Outcome: Progressing   Problem: Bowel/Gastric: Goal: Gastrointestinal status for postoperative course will improve Outcome: Progressing   Problem: Nutritional: Goal: Will attain and maintain optimal nutritional status will improve Outcome: Progressing   Problem: Clinical Measurements: Goal: Postoperative complications will be avoided or minimized Outcome: Progressing   Problem: Skin Integrity: Goal: Will show signs of wound healing Outcome: Progressing

## 2023-05-14 NOTE — Anesthesia Procedure Notes (Signed)
Procedure Name: Intubation Date/Time: 05/14/2023 12:26 PM  Performed by: Ahmed Prima, CRNAPre-anesthesia Checklist: Patient identified, Emergency Drugs available, Suction available and Patient being monitored Patient Re-evaluated:Patient Re-evaluated prior to induction Oxygen Delivery Method: Circle system utilized Preoxygenation: Pre-oxygenation with 100% oxygen Induction Type: IV induction Ventilation: Mask ventilation without difficulty Laryngoscope Size: Miller and 2 Grade View: Grade II Tube type: Oral Tube size: 7.5 mm Number of attempts: 1 Airway Equipment and Method: Stylet and Oral airway Placement Confirmation: ETT inserted through vocal cords under direct vision, positive ETCO2 and breath sounds checked- equal and bilateral Secured at: 23 (at lips) cm Tube secured with: Tape Dental Injury: Teeth and Oropharynx as per pre-operative assessment

## 2023-05-14 NOTE — Anesthesia Postprocedure Evaluation (Signed)
Anesthesia Post Note  Patient: Troy Dillon  Procedure(s) Performed: LOOP ILEOSTOMY TAKEDOWN REMOVAL PORT-A-CATH     Patient location during evaluation: PACU Anesthesia Type: General Level of consciousness: awake and alert Pain management: pain level controlled Vital Signs Assessment: post-procedure vital signs reviewed and stable Respiratory status: spontaneous breathing, nonlabored ventilation, respiratory function stable and patient connected to nasal cannula oxygen Cardiovascular status: blood pressure returned to baseline and stable Postop Assessment: no apparent nausea or vomiting Anesthetic complications: no   No notable events documented.  Last Vitals:  Vitals:   05/14/23 1550 05/14/23 1657  BP: (!) 131/96 (!) 133/95  Pulse: (!) 58 62  Resp: 18 16  Temp: 36.4 C (!) 36.4 C  SpO2: 99% 100%    Last Pain:  Vitals:   05/14/23 1715  TempSrc:   PainSc: 7                  [place]

## 2023-05-14 NOTE — Discharge Instructions (Signed)
POST OP INSTRUCTIONS AFTER COLON SURGERY  DIET: Be sure to include lots of fluids daily to stay hydrated - 64oz of water per day (8, 8 oz glasses).  Avoid fast food or heavy meals for the first couple of weeks as your are more likely to get nauseated. Avoid raw/uncooked fruits or vegetables for the first 4 weeks (its ok to have these if they are blended into smoothie form). If you have fruits/vegetables, make sure they are cooked until soft enough to mash on the roof of your mouth and chew your food well. Otherwise, diet as tolerated.  Take your usually prescribed home medications unless otherwise directed.  PAIN CONTROL: Pain is best controlled by a usual combination of three different methods TOGETHER: Ice/Heat Over the counter pain medication Prescription pain medication Most patients will experience some swelling and bruising around the surgical site.  Ice packs or heating pads (30-60 minutes up to 6 times a day) will help. Some people prefer to use ice alone, heat alone, alternating between ice & heat.  Experiment to what works for you.  Swelling and bruising can take several weeks to resolve.   It is helpful to take an over-the-counter pain medication regularly for the first few weeks: Ibuprofen (Motrin/Advil) - 200mg  tabs - take 3 tabs (600mg ) every 6 hours as needed for pain (unless you have been directed previously to avoid NSAIDs/ibuprofen) Acetaminophen (Tylenol) - you may take 650mg  every 6 hours as needed. You can take this with motrin as they act differently on the body. If you are taking a narcotic pain medication that has acetaminophen in it, do not take over the counter tylenol at the same time. NOTE: You may take both of these medications together - most patients  find it most helpful when alternating between the two (i.e. Ibuprofen at 6am, tylenol at 9am, ibuprofen at 12pm ..Marland Kitchen) A  prescription for pain medication should be given to you upon discharge.  Take your pain medication as  prescribed if your pain is not adequatly controlled with the over-the-counter pain reliefs mentioned above.  Avoid getting constipated.  Between the surgery and the pain medications, it is common to experience some constipation.  Increasing fluid intake and taking a fiber supplement (such as Metamucil, Citrucel, FiberCon, MiraLax, etc) 1-2 times a day regularly will usually help prevent this problem from occurring.  A mild laxative (prune juice, Milk of Magnesia, MiraLax, etc) should be taken according to package directions if there are no bowel movements after 48 hours.    Dressing: You may bathe normally.  Remove your dressing daily.  It is okay to get soap and water in the wound during bathing.  Pat dry after shower and cover with clean dry gauze.  This may be secured this with tape.  No further wound packing should be necessary.  ACTIVITIES as tolerated:   Avoid heavy lifting (>10lbs or 1 gallon of milk) for the next 6 weeks. You may resume regular daily activities as tolerated--such as daily self-care, walking, climbing stairs--gradually increasing activities as tolerated.  If you can walk 30 minutes without difficulty, it is safe to try more intense activity such as jogging, treadmill, bicycling, low-impact aerobics.  DO NOT PUSH THROUGH PAIN.  Let pain be your guide: If it hurts to do something, don't do it. You may drive when you are no longer taking prescription pain medication, you can comfortably wear a seatbelt, and you can safely maneuver your car and apply brakes.  FOLLOW UP in our  office Please call [facility] at [phone] to set up an appointment to see your surgeon in the office for a follow-up appointment approximately 2 weeks after your surgery. Make sure that you call for this appointment the day you arrive home to insure a convenient appointment time.  9. If you have disability or family leave forms that need to be completed, you may have them completed by your primary care  physician's office; for return to work instructions, please ask our office staff and they will be happy to assist you in obtaining this documentation   When to call us [phone]: Poor pain control Reactions / problems with new medications (rash/itching, etc)  Fever over 101.5 F (38.5 C) Inability to urinate Nausea/vomiting Worsening swelling or bruising Continued bleeding from incision. Increased pain, redness, or drainage from the incision  The clinic staff is available to answer your questions during regular business hours (8:30am-5pm).  Please don't hesitate to call and ask to speak to one of our nurses for clinical concerns.   A surgeon from [facility] is always on call at the hospitals   If you have a medical emergency, go to the nearest emergency room or call 911.  [facility] [address], [address], [place], [place]  [address] MAIN: [phone] FAX: [phone] [email or web address]

## 2023-05-14 NOTE — Transfer of Care (Signed)
Immediate Anesthesia Transfer of Care Note  Patient: Troy Dillon  Procedure(s) Performed: LOOP ILEOSTOMY TAKEDOWN REMOVAL PORT-A-CATH  Patient Location: PACU  Anesthesia Type:General  Level of Consciousness: awake, alert , oriented, and patient cooperative  Airway & Oxygen Therapy: Patient Spontanous Breathing  Post-op Assessment: Report given to RN and Post -op Vital signs reviewed and stable  Post vital signs: Reviewed and stable  Last Vitals:  Vitals Value Taken Time  BP 160/99 05/14/23 1427  Temp    Pulse 85 05/14/23 1429  Resp 17 05/14/23 1429  SpO2 97 % on RA 05/14/23 1429  Vitals shown include unfiled device data.  Last Pain:  Vitals:   05/14/23 1014  TempSrc: Oral         Complications: No notable events documented.

## 2023-05-15 ENCOUNTER — Encounter (HOSPITAL_COMMUNITY): Payer: Self-pay | Admitting: Surgery

## 2023-05-15 LAB — CBC
HCT: 42.9 % (ref 39.0–52.0)
Hemoglobin: 14.7 g/dL (ref 13.0–17.0)
MCH: 30.9 pg (ref 26.0–34.0)
MCHC: 34.3 g/dL (ref 30.0–36.0)
MCV: 90.3 fL (ref 80.0–100.0)
Platelets: 144 10*3/uL — ABNORMAL LOW (ref 150–400)
RBC: 4.75 MIL/uL (ref 4.22–5.81)
RDW: 12.5 % (ref 11.5–15.5)
WBC: 6.4 10*3/uL (ref 4.0–10.5)
nRBC: 0 % (ref 0.0–0.2)

## 2023-05-15 LAB — BASIC METABOLIC PANEL
Anion gap: 8 (ref 5–15)
BUN: 12 mg/dL (ref 6–20)
CO2: 25 mmol/L (ref 22–32)
Calcium: 8.7 mg/dL — ABNORMAL LOW (ref 8.9–10.3)
Chloride: 102 mmol/L (ref 98–111)
Creatinine, Ser: 0.97 mg/dL (ref 0.61–1.24)
GFR, Estimated: >60 mL/min (ref >60)
Glucose, Bld: 115 mg/dL — ABNORMAL HIGH (ref 70–99)
Potassium: 4.5 mmol/L (ref 3.5–5.1)
Sodium: 135 mmol/L (ref 135–145)

## 2023-05-15 NOTE — Progress Notes (Signed)
1 Day Post-Op   Subjective/Chief Complaint: Patient with good pain control postop day 1  1 BM   Objective: Vital signs in last 24 hours: Temp:  [97.5 F (36.4 C)-98.4 F (36.9 C)] 97.5 F (36.4 C) (09/21 0822) Pulse Rate:  [52-93] 55 (09/21 0822) Resp:  [12-18] 18 (09/21 0518) BP: (116-160)/(78-100) 116/88 (09/21 0822) SpO2:  [94 %-100 %] 100 % (09/21 0822) Weight:  [79.2 kg-79.4 kg] 79.2 kg (09/21 0500) Last BM Date : 05/14/23  Intake/Output from previous day: 09/20 0701 - 09/21 0700 In: 2061.2 [P.O.:600; I.V.:1361.2; IV Piggyback:100] Out: 50 [Blood:50] Intake/Output this shift: No intake/output data recorded.  Abdomen: Ostomy site is clean with packing in place.  Appropriate tenderness at incision.  Flat.  Nondistended. Port extraction site clean Lab Results:  Recent Labs    05/15/23 0511  WBC 6.4  HGB 14.7  HCT 42.9  PLT 144*   BMET Recent Labs    05/15/23 0511  NA 135  K 4.5  CL 102  CO2 25  GLUCOSE 115*  BUN 12  CREATININE 0.97  CALCIUM 8.7*   PT/INR No results for input(s): "LABPROT", "INR" in the last 72 hours. ABG No results for input(s): "PHART", "HCO3" in the last 72 hours.  Invalid input(s): "PCO2", "PO2"  Studies/Results: No results found.  Anti-infectives: Anti-infectives (From admission, onward)    Start     Dose/Rate Route Frequency Ordered Stop   05/14/23 1015  cefoTEtan (CEFOTAN) 2 g in sodium chloride 0.9 % 100 mL IVPB        2 g 200 mL/hr over 30 Minutes Intravenous On call to O.R. 05/14/23 1011 05/14/23 1259       Assessment/Plan: s/p Procedure(s): LOOP ILEOSTOMY TAKEDOWN (N/A) REMOVAL PORT-A-CATH (N/A) Advance diet  If he tolerates soft diet, plan for discharge Sunday  LOS: 1 day    Troy Dillon 05/15/2023

## 2023-05-16 LAB — CBC
HCT: 41.7 % (ref 39.0–52.0)
Hemoglobin: 14.2 g/dL (ref 13.0–17.0)
MCH: 30.9 pg (ref 26.0–34.0)
MCHC: 34.1 g/dL (ref 30.0–36.0)
MCV: 90.7 fL (ref 80.0–100.0)
Platelets: 141 10*3/uL — ABNORMAL LOW (ref 150–400)
RBC: 4.6 MIL/uL (ref 4.22–5.81)
RDW: 13 % (ref 11.5–15.5)
WBC: 6 10*3/uL (ref 4.0–10.5)
nRBC: 0 % (ref 0.0–0.2)

## 2023-05-16 LAB — BASIC METABOLIC PANEL
Anion gap: 3 — ABNORMAL LOW (ref 5–15)
BUN: 9 mg/dL (ref 6–20)
CO2: 30 mmol/L (ref 22–32)
Calcium: 8.2 mg/dL — ABNORMAL LOW (ref 8.9–10.3)
Chloride: 102 mmol/L (ref 98–111)
Creatinine, Ser: 1.14 mg/dL (ref 0.61–1.24)
GFR, Estimated: >60 mL/min (ref >60)
Glucose, Bld: 105 mg/dL — ABNORMAL HIGH (ref 70–99)
Potassium: 4 mmol/L (ref 3.5–5.1)
Sodium: 135 mmol/L (ref 135–145)

## 2023-05-16 MED ORDER — TRAMADOL HCL 50 MG PO TABS
50.0000 mg | ORAL_TABLET | Freq: Four times a day (QID) | ORAL | 0 refills | Status: DC | PRN
Start: 2023-05-16 — End: 2023-10-12

## 2023-05-16 NOTE — Progress Notes (Signed)
Reviewed written d/c instructions w pt and his wife, all questions answered and they both verbalized understanding. Reviewed/demonstrated daily guaze dsg changes to old stoma site, supplies provided and they again verbalized understanding. D/C via w/c w all belongings in stable condition.

## 2023-05-16 NOTE — Plan of Care (Signed)
Problem: Education: Goal: Understanding of discharge needs will improve Outcome: Adequate for Discharge Goal: Verbalization of understanding of the causes of altered bowel function will improve Outcome: Adequate for Discharge   Problem: Activity: Goal: Ability to tolerate increased activity will improve Outcome: Adequate for Discharge   Problem: Bowel/Gastric: Goal: Gastrointestinal status for postoperative course will improve Outcome: Adequate for Discharge   Problem: Health Behavior/Discharge Planning: Goal: Identification of community resources to assist with postoperative recovery needs will improve Outcome: Adequate for Discharge   Problem: Nutritional: Goal: Will attain and maintain optimal nutritional status will improve Outcome: Adequate for Discharge   Problem: Clinical Measurements: Goal: Postoperative complications will be avoided or minimized Outcome: Adequate for Discharge   Problem: Respiratory: Goal: Respiratory status will improve Outcome: Adequate for Discharge   Problem: Skin Integrity: Goal: Will show signs of wound healing Outcome: Adequate for Discharge   Problem: Education: Goal: Knowledge of General Education information will improve Description: Including pain rating scale, medication(s)/side effects and non-pharmacologic comfort measures Outcome: Adequate for Discharge   Problem: Health Behavior/Discharge Planning: Goal: Ability to manage health-related needs will improve Outcome: Adequate for Discharge   Problem: Clinical Measurements: Goal: Ability to maintain clinical measurements within normal limits will improve Outcome: Adequate for Discharge Goal: Will remain free from infection Outcome: Adequate for Discharge Goal: Diagnostic test results will improve Outcome: Adequate for Discharge Goal: Respiratory complications will improve Outcome: Adequate for Discharge Goal: Cardiovascular complication will be avoided Outcome: Adequate for  Discharge   Problem: Activity: Goal: Risk for activity intolerance will decrease Outcome: Adequate for Discharge   Problem: Nutrition: Goal: Adequate nutrition will be maintained Outcome: Adequate for Discharge   Problem: Coping: Goal: Level of anxiety will decrease Outcome: Adequate for Discharge   Problem: Elimination: Goal: Will not experience complications related to bowel motility Outcome: Adequate for Discharge Goal: Will not experience complications related to urinary retention Outcome: Adequate for Discharge   Problem: Pain Managment: Goal: General experience of comfort will improve Outcome: Adequate for Discharge   Problem: Safety: Goal: Ability to remain free from injury will improve Outcome: Adequate for Discharge   Problem: Skin Integrity: Goal: Risk for impaired skin integrity will decrease Outcome: Adequate for Discharge   Problem: Education: Goal: Required Educational Video(s) Outcome: Adequate for Discharge   Problem: Clinical Measurements: Goal: Ability to maintain clinical measurements within normal limits will improve Outcome: Adequate for Discharge Goal: Postoperative complications will be avoided or minimized Outcome: Adequate for Discharge   Problem: Skin Integrity: Goal: Demonstration of wound healing without infection will improve Outcome: Adequate for Discharge   Problem: Education: Goal: Knowledge of General Education information will improve Description: Including pain rating scale, medication(s)/side effects and non-pharmacologic comfort measures Outcome: Adequate for Discharge   Problem: Health Behavior/Discharge Planning: Goal: Ability to manage health-related needs will improve Outcome: Adequate for Discharge   Problem: Clinical Measurements: Goal: Ability to maintain clinical measurements within normal limits will improve Outcome: Adequate for Discharge Goal: Will remain free from infection Outcome: Adequate for  Discharge Goal: Diagnostic test results will improve Outcome: Adequate for Discharge Goal: Respiratory complications will improve Outcome: Adequate for Discharge Goal: Cardiovascular complication will be avoided Outcome: Adequate for Discharge   Problem: Activity: Goal: Risk for activity intolerance will decrease Outcome: Adequate for Discharge   Problem: Nutrition: Goal: Adequate nutrition will be maintained Outcome: Adequate for Discharge   Problem: Coping: Goal: Level of anxiety will decrease Outcome: Adequate for Discharge   Problem: Elimination: Goal: Will not experience complications related to bowel motility  Outcome: Adequate for Discharge Goal: Will not experience complications related to urinary retention Outcome: Adequate for Discharge   Problem: Pain Managment: Goal: General experience of comfort will improve Outcome: Adequate for Discharge   Problem: Safety: Goal: Ability to remain free from injury will improve Outcome: Adequate for Discharge   Problem: Skin Integrity: Goal: Risk for impaired skin integrity will decrease Outcome: Adequate for Discharge

## 2023-05-16 NOTE — Discharge Summary (Signed)
Physician Discharge Summary  Patient ID: Troy Dillon MRN: [id number] DOB/AGE: 1971-03-13 52 y.o.  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 Bowel function returned and was tolerating diet. Pain control was good and ambulating in the halls.       Treatments: surgery: closure of loop ileostomy   Discharge Exam: Blood pressure 116/68, pulse 67, temperature 97.7 F (36.5 C), temperature source Oral, resp. rate 17, height 5\' 9"  (1.753 m), weight 77.8 kg, SpO2 97%. General appearance: alert and cooperative Chest wall: port site CDI  Incision/Wound: ABDOMEN soft ostomy site open and packing removed clean   Disposition: Discharge disposition: 01-Home or Self Care       Discharge Instructions     Diet - low sodium heart healthy   Complete by: As directed    Increase activity slowly   Complete by: As directed       Allergies as of 05/16/2023       Reactions   Hydrocodone Itching   Latex    Eye irritation         Medication List     TAKE these medications    escitalopram 10 MG tablet Commonly known as: LEXAPRO Take 1 tablet (10 mg total) by mouth daily.   loperamide 2 MG capsule Commonly known as: IMODIUM Take 1 capsule (2 mg total) by mouth 3 (three) times daily.   traMADol 50 MG tablet Commonly known as: Ultram Take 1 tablet (50 mg total) by mouth every 6 (six) hours as needed for up to 5 days (postop pain not controlled with tylenol and ibuprofen first).        Follow-up Information     Andria Meuse, MD Follow up on 06/02/2023.   Specialties: General Surgery, Colon and Rectal Surgery Why: Please arrive by 1:45 pm Contact information: [address] [place] [address] [phone]                  Signed: Clovis Pu Anwyn Kriegel 05/16/2023, 9:26 AM

## 2023-05-16 NOTE — Progress Notes (Signed)
Transition of Care [facility]) - Inpatient Brief Assessment   Patient Details  Name: Troy Dillon MRN: [id number] Date of Birth: April 28, 1971  Transition of Care [facility]) CM/SW Contact:    Adrian Prows, RN Phone Number: 05/16/2023, 10:39 AM   Clinical Narrative: Spoke w/ pt and wife Troy Dillon [phone]  in room. Brief TOC assessment completed.   Transition of Care Asessment: Insurance and Status: Insurance coverage has been reviewed Patient has primary care physician: Yes Home environment has been reviewed: yes Prior level of function:: independent Prior/Current Home Services: No current home services Social Determinants of Health Reivew: SDOH reviewed no interventions necessary Readmission risk has been reviewed: Yes Transition of care needs: no transition of care needs at this time

## 2023-05-17 ENCOUNTER — Other Ambulatory Visit: Payer: Self-pay

## 2023-05-17 ENCOUNTER — Other Ambulatory Visit (HOSPITAL_COMMUNITY): Payer: Self-pay

## 2023-05-17 LAB — SURGICAL PATHOLOGY

## 2023-05-27 ENCOUNTER — Encounter: Payer: Self-pay | Admitting: Hematology

## 2023-06-15 ENCOUNTER — Encounter: Payer: Self-pay | Admitting: Hematology

## 2023-06-21 ENCOUNTER — Encounter: Payer: Self-pay | Admitting: Hematology

## 2023-06-22 ENCOUNTER — Other Ambulatory Visit: Payer: No Typology Code available for payment source

## 2023-06-22 ENCOUNTER — Ambulatory Visit (HOSPITAL_COMMUNITY)
Admission: RE | Admit: 2023-06-22 | Discharge: 2023-06-22 | Disposition: A | Discharge status: Home / Self Care | Payer: 59 | Source: Ambulatory Visit | Attending: Hematology | Admitting: Hematology

## 2023-06-22 ENCOUNTER — Inpatient Hospital Stay: Payer: 59 | Attending: Hematology

## 2023-06-22 DIAGNOSIS — R911 Solitary pulmonary nodule: Secondary | ICD-10-CM | POA: Diagnosis not present

## 2023-06-22 DIAGNOSIS — C20 Malignant neoplasm of rectum: Secondary | ICD-10-CM | POA: Insufficient documentation

## 2023-06-22 DIAGNOSIS — R140 Abdominal distension (gaseous): Secondary | ICD-10-CM | POA: Diagnosis not present

## 2023-06-22 DIAGNOSIS — K449 Diaphragmatic hernia without obstruction or gangrene: Secondary | ICD-10-CM | POA: Diagnosis not present

## 2023-06-22 LAB — COMPREHENSIVE METABOLIC PANEL
ALT: 16 U/L (ref 0–44)
AST: 20 U/L (ref 15–41)
Albumin: 3.8 g/dL (ref 3.5–5.0)
Alkaline Phosphatase: 113 U/L (ref 38–126)
Anion gap: 8 (ref 5–15)
BUN: 16 mg/dL (ref 6–20)
CO2: 28 mmol/L (ref 22–32)
Calcium: 8.6 mg/dL — ABNORMAL LOW (ref 8.9–10.3)
Chloride: 101 mmol/L (ref 98–111)
Creatinine, Ser: 1.03 mg/dL (ref 0.61–1.24)
GFR, Estimated: >60 mL/min (ref >60)
Glucose, Bld: 137 mg/dL — ABNORMAL HIGH (ref 70–99)
Potassium: 3.8 mmol/L (ref 3.5–5.1)
Sodium: 137 mmol/L (ref 135–145)
Total Bilirubin: 0.7 mg/dL (ref 0.3–1.2)
Total Protein: 7.4 g/dL (ref 6.5–8.1)

## 2023-06-22 LAB — CBC WITH DIFFERENTIAL/PLATELET
Abs Immature Granulocytes: 0.01 10*3/uL (ref 0.00–0.07)
Basophils Absolute: 0 10*3/uL (ref 0.0–0.1)
Basophils Relative: 1 %
Eosinophils Absolute: 0.2 10*3/uL (ref 0.0–0.5)
Eosinophils Relative: 5 %
HCT: 42.5 % (ref 39.0–52.0)
Hemoglobin: 14.6 g/dL (ref 13.0–17.0)
Immature Granulocytes: 0 %
Lymphocytes Relative: 13 %
Lymphs Abs: 0.5 10*3/uL — ABNORMAL LOW (ref 0.7–4.0)
MCH: 31.3 pg (ref 26.0–34.0)
MCHC: 34.4 g/dL (ref 30.0–36.0)
MCV: 91 fL (ref 80.0–100.0)
Monocytes Absolute: 0.4 10*3/uL (ref 0.1–1.0)
Monocytes Relative: 11 %
Neutro Abs: 2.4 10*3/uL (ref 1.7–7.7)
Neutrophils Relative %: 70 %
Platelets: 158 10*3/uL (ref 150–400)
RBC: 4.67 MIL/uL (ref 4.22–5.81)
RDW: 13.2 % (ref 11.5–15.5)
WBC: 3.4 10*3/uL — ABNORMAL LOW (ref 4.0–10.5)
nRBC: 0 % (ref 0.0–0.2)

## 2023-06-22 LAB — MAGNESIUM: Magnesium: 2 mg/dL (ref 1.7–2.4)

## 2023-06-22 MED ORDER — IOHEXOL 9 MG/ML PO SOLN
ORAL | Status: AC
  Filled 2023-06-22: qty 1000

## 2023-06-22 MED ORDER — IOHEXOL 300 MG/ML  SOLN: 100.0000 mL | Freq: Once | INTRAMUSCULAR | Status: DC | PRN

## 2023-06-22 MED ORDER — IOHEXOL 300 MG/ML  SOLN
100.0000 mL | Freq: Once | INTRAMUSCULAR | Status: AC | PRN
  Administered 2023-06-22: 100 mL via INTRAVENOUS

## 2023-06-23 LAB — CEA: CEA: 1.9 ng/mL (ref 0.0–4.7)

## 2023-07-06 ENCOUNTER — Inpatient Hospital Stay: Payer: No Typology Code available for payment source | Attending: Hematology | Admitting: Hematology

## 2023-07-06 VITALS — BP 128/86 | HR 73 | Temp 98.2°F | Resp 18 | Wt 182.8 lb

## 2023-07-06 DIAGNOSIS — Z800 Family history of malignant neoplasm of digestive organs: Secondary | ICD-10-CM | POA: Insufficient documentation

## 2023-07-06 DIAGNOSIS — Z923 Personal history of irradiation: Secondary | ICD-10-CM | POA: Diagnosis not present

## 2023-07-06 DIAGNOSIS — C20 Malignant neoplasm of rectum: Secondary | ICD-10-CM | POA: Diagnosis not present

## 2023-07-06 DIAGNOSIS — Z87891 Personal history of nicotine dependence: Secondary | ICD-10-CM | POA: Insufficient documentation

## 2023-07-06 DIAGNOSIS — G629 Polyneuropathy, unspecified: Secondary | ICD-10-CM | POA: Insufficient documentation

## 2023-07-06 NOTE — Patient Instructions (Addendum)
[facility] at [facility] Discharge Instructions   You were seen and examined today by Dr. Ellin Saba.  He reviewed the results of your lab work which are normal/stable.   He reviewed the results of your CT scan which was normal/stable.   We will see you back in 3 months. We will repeat lab work prior to this visit.   Return as scheduled.    Thank you for choosing [facility] at [facility] to provide your oncology and hematology care.  To afford each patient quality time with our provider, please arrive at least 15 minutes before your scheduled appointment time.   If you have a lab appointment with the Cancer Center please come in thru the Main Entrance and check in at the main information desk.  You need to re-schedule your appointment should you arrive 10 or more minutes late.  We strive to give you quality time with our providers, and arriving late affects you and other patients whose appointments are after yours.  Also, if you no show three or more times for appointments you may be dismissed from the clinic at the providers discretion.     Again, thank you for choosing [facility].  Our hope is that these requests will decrease the amount of time that you wait before being seen by our physicians.       _____________________________________________________________  Should you have questions after your visit to [facility], please contact our office at [phone] and follow the prompts.  Our office hours are 8:00 a.m. and 4:30 p.m. Monday - Friday.  Please note that voicemails left after 4:00 p.m. may not be returned until the following business day.  We are closed weekends and major holidays.  You do have access to a nurse 24-7, just call the main number to the clinic [phone] and do not press any options, hold on the line and a nurse will answer the phone.    For prescription refill requests, have your  pharmacy contact our office and allow 72 hours.    Due to Covid, you will need to wear a mask upon entering the hospital. If you do not have a mask, a mask will be given to you at the Main Entrance upon arrival. For doctor visits, patients may have 1 support person age 36 or older with them. For treatment visits, patients can not have anyone with them due to social distancing guidelines and our immunocompromised population.

## 2023-07-06 NOTE — Progress Notes (Signed)
[facility] [address], [place] [address]    Clinic Day:  07/06/2023  Referring physician: [facility]  Patient Care Team: [facility] as PCP - General [name], MD as PCP - Cardiology (Cardiology) Doreatha Massed, MD as Medical Oncologist (Medical Oncology) Therese Sarah, RN as Oncology Nurse Navigator (Medical Oncology)   ASSESSMENT & PLAN:   Assessment: 1.  Stage IIIB (T3cN1) rectal adenocarcinoma, MSI stable: - Presentation with rectal bleeding - Colonoscopy (06/08/2022): Fungating partially obstructing large mass found in the rectum 11 cm from anal verge.  Partially circumferential involving one half of the lumen.  Mass measured 3 cm in length.  Diameter 2 cm.  Oozing present.  1 semipedunculated polyp (40 cm from anus) and 3 sessile polyps in the cecum removed. - Pathology (06/08/2022): Rectal mass 11 cm from anal verge-adenocarcinoma.  MMR preserved.  MSI-stable. - CEA (06/08/2022): 2.9 - MRI pelvis (06/10/2022): T3CN1, tumor extends into sigmoid from the high rectum, extension through muscularis propria, approximately 6 mm beyond the rectal wall.  Tumor begins in the highly portion of the rectum and extends into the sigmoid colon.  Extramural vascular invasion/tumor thrombus along the right lateral margin.  Shortest distance of tumor from mesorectal fascia: 15 mm.  Single high mesorectal or superior rectal lymph node measuring 9 mm.  Distance from tumor to internal anal sphincter is approximately 7-11 cm. - CT CAP (06/10/2022): Circumferential wall thickening of the upper/mid rectum, mildly enlarged high perirectal lymph nodes measuring up to 6 mm in short axis.  No evidence of distant metastatic disease in the chest, abdomen or pelvis. - Neoadjuvant FOLFOX 8 cycles from 06/29/2022 through 10/05/2022 - MRI pelvis (10/13/2022): Evidence of decreased extramural vascular invasion along the right lateral margin.  Tumor  length 3.4 cm, previously 4.5 cm.  Significant interval decrease thickening of high rectum/low sigmoid colon.  Mesorectal lymph nodes 7 mm, previously 9 mm in 4 mm node.  I discussed with radiologist who felt that there is more than 20% clinical improvement. - I have recommended foregoing radiation based on Prospect trial data.  He met with Dr. Cliffton Asters and Dr. Mitzi Hansen who have recommended chemoradiation. - Chemoradiation with Xeloda from 11/11/2022 through 12/18/2022. - Robotic assisted low anterior resection on 02/17/2023 by Dr. Cliffton Asters - Pathology: [id number], 2/22 lymph nodes involved, margins negative, residual moderate differentiated adenocarcinoma discontinuously involving a fibrotic area of about 2.2 cm.  MMR preserved.   2.  Social/family history: - He lives at home with his wife Troy Dillon.  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  Denies any chemical exposure.  Does not smoke cigarettes but dips tobacco. - Father had colon cancer at age 51. - Paternal grandmother died of colon cancer. - Paternal aunt had colon cancer. - Mother had "male cancer" and underwent hysterectomy.    Plan: 1.  Stage IIIb (T3CN1) rectal adenocarcinoma, MSI-stable: - He had reversal of ileostomy and port removed by Dr. Cliffton Asters. - No change in bowel habits reported. - Reviewed labs from 06/22/2023: Normal LFTs.  CEA was normal at 1.9. - CT CAP from 06/22/2023: No new or progressive findings in the chest, abdomen or pelvis.  No significant interval change in the high rectal wall thickening.  Continued decrease in size of the mesorectal/sigmoid mesentery lymph nodes which are too small to definitely characterize.  Stable 2-3 mm right lower lobe lung nodule.  Resolved right anterior upper lobe micronodules.  No new nodules. - Recommend follow-up  in 3 months with repeat CEA and labs.  Discussed surveillance plan with follow-up visits every 3 months and CT scan every 6 months for the first 2 years.   2.  Peripheral  neuropathy: - He has numbness in the fingertips and feet which is stable.  It is not interfering in day-to-day activities.  No pharmacological intervention needed.     Orders Placed This Encounter  Procedures   CBC with Differential    Standing Status:   Future    Standing Expiration Date:   07/05/2024   Comprehensive metabolic panel    Standing Status:   Future    Standing Expiration Date:   07/05/2024   CEA    Standing Status:   Future    Standing Expiration Date:   07/05/2024      Troy Dillon,acting as a scribe for Doreatha Massed, MD.,have documented all relevant documentation on the behalf of Doreatha Massed, MD,as directed by  Doreatha Massed, MD while in the presence of Doreatha Massed, MD.  I, Doreatha Massed MD, have reviewed the above documentation for accuracy and completeness, and I agree with the above.     Doreatha Massed, MD   11/12/20244:45 PM  CHIEF COMPLAINT:   Diagnosis: stage III rectal cancer     Cancer Staging  Rectal carcinoma [facility]) Staging form: Colon and Rectum, AJCC 8th Edition - Clinical stage from 06/18/2022: Stage IIIB (cT3, cN1, cM0) - Unsigned    Prior Therapy: 1. FOLFOX 06/29/22 - 10/05/22 2. Xeloda, radiation 11/11/22 - 12/18/22  Current Therapy: Surgery on 01/28/2023   HISTORY OF PRESENT ILLNESS:   Oncology History  Rectal carcinoma (HCC)  06/17/2022 Initial Diagnosis   Rectal carcinoma (HCC)   06/29/2022 -  Chemotherapy   Patient is on Treatment Plan : COLORECTAL FOLFOX q14d x 4 months      Genetic Testing   [facility] Common Cancer Panel+RNA was Negative. Report date is 08/29/2022.  The Common Hereditary Cancers Panel offered by [facility] includes sequencing and/or deletion duplication testing of the following 48 genes: APC, ATM, AXIN2, BAP1, BARD1, BMPR1A, BRCA1, BRCA2, BRIP1, CDH1, CDK4, CDKN2A (p14ARF and p16INK4a only), CHEK2, CTNNA1, DICER1, EPCAM (Deletion/duplication testing only), FH, GREM1  (promoter region duplication testing only), HOXB13, KIT, MBD4, MEN1, MLH1, MSH2, MSH3, MSH6, MUTYH, NF1, NHTL1, PALB2, PDGFRA, PMS2, POLD1, POLE, PTEN, RAD51C, RAD51D, SDHA (sequencing analysis only except exon 14), SDHB, SDHC, SDHD, SMAD4, SMARCA4. STK11, TP53, TSC1, TSC2, and VHL.      INTERVAL HISTORY:   Troy Dillon is a 52 y.o. male presenting to clinic today for follow up of stage III rectal cancer. He was last seen by me on 03/29/23.  Since his last visit, he underwent loop ileostomy takedown with port removal on 05/14/23 with Dr. Cliffton Asters. Pathology of the ileostomy biopsy revealed: ileocutaneous anastomosis showing mucosal erosion and reactive changes, serosal adhesions, and margin viable and free of inflammation.   He had CT C/A/P on 06/22/23 that found: no new or progressive findings in the chest, abdomen or pelvis; no significant interval change in the high rectal/low sigmoid wall thickening; continued decrease in size of the mesorectal/sigmoid mesentery lymph nodes which are now too small to definitively characterize previously measuring up to 3 mm; stable 2-3 mm right lower lobe pulmonary nodule; resolved right anterior upper lobe micronodules; and no new pulmonary nodules or masses. He had a post-op visit with Dr. Cliffton Asters on 06/02/23.   Today, he states that he is doing well overall. His appetite level is at 100%. His energy  level is at 100%.   His neuropathy is constant tingling in the feet and fingertips. He denies any pain from neuropathy. He reports normal BM's with solid stools. He denies any nausea or vomiting. He notes a normal appetite.   PAST MEDICAL HISTORY:   Past Medical History: Past Medical History:  Diagnosis Date   Anemia    Anxiety    Depression    Neuromuscular disorder (HCC)    Neuropathy d/t chemo   Rectal cancer (HCC) 06/08/2022    Surgical History: Past Surgical History:  Procedure Laterality Date   COLONOSCOPY  06/08/2022   EYE SURGERY     FLEXIBLE SIGMOIDOSCOPY  N/A 02/17/2023   Procedure: FLEXIBLE SIGMOIDOSCOPY;  Surgeon: Andria Meuse, MD;  Location: [facility];  Service: General;  Laterality: N/A;   FLEXIBLE SIGMOIDOSCOPY N/A 03/22/2023   Procedure: Arnell Sieving;  Surgeon: Andria Meuse, MD;  Location: [facility];  Service: General;  Laterality: N/A;   FRACTURE SURGERY     Right Femur- Cables in bone remain in place, rod removed in 2000   ILEOSTOMY CLOSURE N/A 05/14/2023   Procedure: LOOP ILEOSTOMY TAKEDOWN;  Surgeon: Andria Meuse, MD;  Location: [facility];  Service: General;  Laterality: N/A;   IR IMAGING GUIDED PORT INSERTION  06/22/2022   ORTHOPEDIC SURGERY     PORT-A-CATH REMOVAL N/A 05/14/2023   Procedure: REMOVAL PORT-A-CATH;  Surgeon: Andria Meuse, MD;  Location: [facility];  Service: General;  Laterality: N/A;   XI ROBOTIC ASSISTED LOWER ANTERIOR RESECTION Bilateral 02/17/2023   Procedure: XI ROBOTIC ASSISTED LOWER ANTERIOR RESECTION, DIVERTING LOOP ILEOSTOMY, BILATERAL TAP BLOCK, TISSUE PERFUSION ASSESSMENT VIA FIREFLY INJECTION;  Surgeon: Andria Meuse, MD;  Location: [facility];  Service: General;  Laterality: Bilateral;    Social History: Social History   Socioeconomic History   Marital status: Married    Spouse name: Not on file   Number of children: Not on file   Years of education: Not on file   Highest education level: Not on file  Occupational History   Not on file  Tobacco Use   Smoking status: Former    Current packs/day: 0.00    Average packs/day: 1 pack/day for 15.0 years (15.0 ttl pk-yrs)    Types: Cigarettes    Start date: 43    Quit date: 2003    Years since quitting: 21.8   Smokeless tobacco: Former    Types: Snuff   Tobacco comments:    Some Dipping  Vaping Use   Vaping status: Never Used  Substance and Sexual Activity   Alcohol use: Yes    Comment: occasionally   Drug use: Never   Sexual activity: Not on file  Other Topics Concern   Not on file  Social History  Narrative   Not on file   Social Determinants of Health   Financial Resource Strain: Not on file  Food Insecurity: No Food Insecurity (05/16/2023)   Hunger Vital Sign    Worried About Running Out of Food in the Last Year: Never true    Ran Out of Food in the Last Year: Never true  Transportation Needs: No Transportation Needs (05/16/2023)   PRAPARE - Administrator, Civil Service (Medical): No    Lack of Transportation (Non-Medical): No  Physical Activity: Not on file  Stress: Not on file  Social Connections: Not on file  Intimate Partner Violence: Not At Risk (05/16/2023)   Humiliation, Afraid, Rape, and Kick questionnaire    Fear  of Current or Ex-Partner: No    Emotionally Abused: No    Physically Abused: No    Sexually Abused: No    Family History: Family History  Problem Relation Age of Onset   Cancer Mother 37 - 74       GYN malignancy details unknown, TAH/BSO   Colon cancer Father 33   Heart attack Father    Colon cancer Paternal Aunt 56   Colon cancer Paternal Grandmother 48    Current Medications:  Current Outpatient Medications:    escitalopram (LEXAPRO) 10 MG tablet, Take 1 tablet (10 mg total) by mouth daily., Disp: 90 tablet, Rfl: 3   loperamide (IMODIUM) 2 MG capsule, Take 1 capsule (2 mg total) by mouth 3 (three) times daily., Disp: 30 capsule, Rfl: 0   traMADol (ULTRAM) 50 MG tablet, Take 1 tablet (50 mg total) by mouth every 6 (six) hours as needed., Disp: 20 tablet, Rfl: 0   Allergies: Allergies  Allergen Reactions   Hydrocodone Itching   Latex     Eye irritation     REVIEW OF SYSTEMS:   Review of Systems  Constitutional:  Negative for chills, fatigue and fever.  HENT:   Negative for lump/mass, mouth sores, nosebleeds, sore throat and trouble swallowing.   Eyes:  Negative for eye problems.  Respiratory:  Negative for cough and shortness of breath.   Cardiovascular:  Negative for chest pain, leg swelling and palpitations.   Gastrointestinal:  Negative for abdominal pain, constipation, diarrhea, nausea and vomiting.  Genitourinary:  Negative for bladder incontinence, difficulty urinating, dysuria, frequency, hematuria and nocturia.   Musculoskeletal:  Negative for arthralgias, back pain, flank pain, myalgias and neck pain.  Skin:  Negative for itching and rash.  Neurological:  Negative for dizziness, headaches and numbness.       +tingling hands and feet  Hematological:  Does not bruise/bleed easily.  Psychiatric/Behavioral:  Negative for depression, sleep disturbance and suicidal ideas. The patient is not nervous/anxious.   All other systems reviewed and are negative.    VITALS:   Blood pressure 128/86, pulse 73, temperature 98.2 F (36.8 C), temperature source Oral, resp. rate 18, weight 182 lb 12.8 oz (82.9 kg), SpO2 98%.  Wt Readings from Last 3 Encounters:  07/06/23 182 lb 12.8 oz (82.9 kg)  05/16/23 171 lb 8.3 oz (77.8 kg)  05/03/23 175 lb (79.4 kg)    Body mass index is 26.99 kg/m.  Performance status (ECOG): 0 - Asymptomatic  PHYSICAL EXAM:   Physical Exam Vitals and nursing note reviewed. Exam conducted with a chaperone present.  Constitutional:      Appearance: Normal appearance.  Cardiovascular:     Rate and Rhythm: Normal rate and regular rhythm.     Pulses: Normal pulses.     Heart sounds: Normal heart sounds.  Pulmonary:     Effort: Pulmonary effort is normal.     Breath sounds: Normal breath sounds.  Abdominal:     Palpations: Abdomen is soft. There is no hepatomegaly, splenomegaly or mass.     Tenderness: There is no abdominal tenderness.  Musculoskeletal:     Right lower leg: No edema.     Left lower leg: No edema.  Lymphadenopathy:     Cervical: No cervical adenopathy.     Right cervical: No superficial, deep or posterior cervical adenopathy.    Left cervical: No superficial, deep or posterior cervical adenopathy.     Upper Body:     Right upper body: No  supraclavicular  or axillary adenopathy.     Left upper body: No supraclavicular or axillary adenopathy.  Neurological:     General: No focal deficit present.     Mental Status: He is alert and oriented to person, place, and time.  Psychiatric:        Mood and Affect: Mood normal.        Behavior: Behavior normal.     LABS:      Latest Ref Rng & Units 06/22/2023    1:53 PM 05/16/2023    5:22 AM 05/15/2023    5:11 AM  CBC  WBC 4.0 - 10.5 K/uL 3.4  6.0  6.4   Hemoglobin 13.0 - 17.0 g/dL 16.1  09.6  04.5   Hematocrit 39.0 - 52.0 % 42.5  41.7  42.9   Platelets 150 - 400 K/uL 158  141  144       Latest Ref Rng & Units 06/22/2023    1:53 PM 05/16/2023    5:22 AM 05/15/2023    5:11 AM  CMP  Glucose 70 - 99 mg/dL 409  811  914   BUN 6 - 20 mg/dL 16  9  12    Creatinine 0.61 - 1.24 mg/dL 7.82  9.56  2.13   Sodium 135 - 145 mmol/L 137  135  135   Potassium 3.5 - 5.1 mmol/L 3.8  4.0  4.5   Chloride 98 - 111 mmol/L 101  102  102   CO2 22 - 32 mmol/L 28  30  25    Calcium 8.9 - 10.3 mg/dL 8.6  8.2  8.7   Total Protein 6.5 - 8.1 g/dL 7.4     Total Bilirubin 0.3 - 1.2 mg/dL 0.7     Alkaline Phos 38 - 126 U/L 113     AST 15 - 41 U/L 20     ALT 0 - 44 U/L 16        Lab Results  Component Value Date   CEA1 1.9 06/22/2023   /  CEA  Date Value Ref Range Status  06/22/2023 1.9 0.0 - 4.7 ng/mL Final    Comment:    (NOTE)                             Nonsmokers          <3.9                             Smokers             <5.6 [employer] Electrochemiluminescence Immunoassay (ECLIA) Values obtained with different assay methods or kits cannot be used interchangeably.  Results cannot be interpreted as absolute evidence of the presence or absence of malignant disease. Performed At: [facility] [address] [place], [place] [id number] Jolene Schimke MD [id number]    No results found for: "PSA1" No results found for: "[id number]" No results found for: "CAN125"  No  results found for: "TOTALPROTELP", "ALBUMINELP", "A1GS", "A2GS", "BETS", "BETA2SER", "GAMS", "MSPIKE", "SPEI" No results found for: "TIBC", "FERRITIN", "IRONPCTSAT" No results found for: "LDH"   STUDIES:   CT CHEST ABDOMEN PELVIS W CONTRAST  Result Date: 06/22/2023 CLINICAL DATA:  History of rectal cancer, follow-up/monitor. * Tracking Code: BO * EXAM: CT CHEST, ABDOMEN, AND PELVIS WITH CONTRAST TECHNIQUE: Multidetector CT imaging of the chest, abdomen and pelvis was performed following the standard protocol during bolus administration of  intravenous contrast. RADIATION DOSE REDUCTION: This exam was performed according to the departmental dose-optimization program which includes automated exposure control, adjustment of the mA and/or kV according to patient size and/or use of iterative reconstruction technique. CONTRAST:  OMNIPAQUE IOHEXOL 300 MG/ML  SOLN COMPARISON:  Multiple priors including most recent CT Jan 15, 2023 FINDINGS: CT CHEST FINDINGS Cardiovascular: Interval removal of the right chest Port-A-Cath. Normal caliber thoracic aorta. No central pulmonary embolus on this nondedicated study. Normal size heart. No significant pericardial effusion/thickening. Mediastinum/Nodes: No suspicious thyroid nodule. No pathologically enlarged mediastinal, hilar or axillary lymph nodes. Small hiatal hernia. Lungs/Pleura: Stable 2-3 mm right lower lobe pulmonary nodule. Resolved right anterior upper lobe micronodules. No new pulmonary nodules or masses. Musculoskeletal: No chest wall mass or suspicious bone lesions identified. CT ABDOMEN PELVIS FINDINGS Hepatobiliary: No focal liver abnormality is seen. No gallstones, gallbladder wall thickening, or biliary dilatation. Pancreas: Unremarkable. No pancreatic ductal dilatation or surrounding inflammatory changes. Spleen: Normal in size without focal abnormality. Adrenals/Urinary Tract: Adrenal glands are unremarkable. Kidneys are normal, without renal  calculi, focal lesion, or hydronephrosis. Bladder is unremarkable. Stomach/Bowel: Radiopaque enteric contrast material traverses the ascending colon. Stomach is distended with ingested material and gas without focal wall thickening. Small hiatal hernia. No pathologic dilation of small or large bowel. No significant interval change in the high rectal/low sigmoid wall thickening for instance on image 108/2. Vascular/Lymphatic: Continued decrease in size of the mesorectal/sigmoid mesentery lymph nodes which are now too small to definitively characterize previously measuring up to 3 mm. No new pathologically enlarged abdominal or pelvic lymph nodes. Reproductive: Prostate is unremarkable. Other: Similar presacral/mesorectal soft tissue thickening/stranding. Musculoskeletal: No aggressive lytic or blastic lesion of bone. Surgical fixation tract in the right femur. Limbus type L1 vertebral body. IMPRESSION: 1. No new or progressive findings in the chest, abdomen or pelvis. 2. No significant interval change in the high rectal/low sigmoid wall thickening. 3. Continued decrease in size of the mesorectal/sigmoid mesentery lymph nodes which are now too small to definitively characterize previously measuring up to 3 mm. 4. Stable 2-3 mm right lower lobe pulmonary nodule. Resolved right anterior upper lobe micronodules. No new pulmonary nodules or masses. Electronically Signed   By: Maudry Mayhew M.D.   On: 06/22/2023 16:41

## 2023-07-08 ENCOUNTER — Other Ambulatory Visit: Payer: Self-pay

## 2023-08-05 ENCOUNTER — Encounter: Payer: Self-pay | Admitting: Hematology

## 2023-09-29 DIAGNOSIS — H401331 Pigmentary glaucoma, bilateral, mild stage: Secondary | ICD-10-CM | POA: Diagnosis not present

## 2023-10-05 ENCOUNTER — Inpatient Hospital Stay: Payer: 59 | Attending: Hematology

## 2023-10-05 DIAGNOSIS — C20 Malignant neoplasm of rectum: Secondary | ICD-10-CM | POA: Diagnosis not present

## 2023-10-05 DIAGNOSIS — G629 Polyneuropathy, unspecified: Secondary | ICD-10-CM | POA: Insufficient documentation

## 2023-10-05 DIAGNOSIS — Z800 Family history of malignant neoplasm of digestive organs: Secondary | ICD-10-CM | POA: Diagnosis not present

## 2023-10-05 LAB — COMPREHENSIVE METABOLIC PANEL
ALT: 15 U/L (ref 0–44)
AST: 19 U/L (ref 15–41)
Albumin: 4 g/dL (ref 3.5–5.0)
Alkaline Phosphatase: 100 U/L (ref 38–126)
Anion gap: 7 (ref 5–15)
BUN: 16 mg/dL (ref 6–20)
CO2: 24 mmol/L (ref 22–32)
Calcium: 8.8 mg/dL — ABNORMAL LOW (ref 8.9–10.3)
Chloride: 103 mmol/L (ref 98–111)
Creatinine, Ser: 1.03 mg/dL (ref 0.61–1.24)
GFR, Estimated: >60 mL/min (ref >60)
Glucose, Bld: 110 mg/dL — ABNORMAL HIGH (ref 70–99)
Potassium: 4.2 mmol/L (ref 3.5–5.1)
Sodium: 134 mmol/L — ABNORMAL LOW (ref 135–145)
Total Bilirubin: 1 mg/dL (ref 0.0–1.2)
Total Protein: 7.4 g/dL (ref 6.5–8.1)

## 2023-10-05 LAB — CBC WITH DIFFERENTIAL/PLATELET
Abs Immature Granulocytes: 0.01 10*3/uL (ref 0.00–0.07)
Basophils Absolute: 0 10*3/uL (ref 0.0–0.1)
Basophils Relative: 1 %
Eosinophils Absolute: 0.2 10*3/uL (ref 0.0–0.5)
Eosinophils Relative: 5 %
HCT: 41.9 % (ref 39.0–52.0)
Hemoglobin: 14.9 g/dL (ref 13.0–17.0)
Immature Granulocytes: 0 %
Lymphocytes Relative: 29 %
Lymphs Abs: 1 10*3/uL (ref 0.7–4.0)
MCH: 31.6 pg (ref 26.0–34.0)
MCHC: 35.6 g/dL (ref 30.0–36.0)
MCV: 88.8 fL (ref 80.0–100.0)
Monocytes Absolute: 0.4 10*3/uL (ref 0.1–1.0)
Monocytes Relative: 12 %
Neutro Abs: 1.9 10*3/uL (ref 1.7–7.7)
Neutrophils Relative %: 53 %
Platelets: 164 10*3/uL (ref 150–400)
RBC: 4.72 MIL/uL (ref 4.22–5.81)
RDW: 12.4 % (ref 11.5–15.5)
WBC: 3.6 10*3/uL — ABNORMAL LOW (ref 4.0–10.5)
nRBC: 0 % (ref 0.0–0.2)

## 2023-10-06 LAB — CEA: CEA: 1.5 ng/mL (ref 0.0–4.7)

## 2023-10-12 ENCOUNTER — Inpatient Hospital Stay (HOSPITAL_BASED_OUTPATIENT_CLINIC_OR_DEPARTMENT_OTHER): Payer: 59 | Admitting: Hematology

## 2023-10-12 ENCOUNTER — Inpatient Hospital Stay: Payer: 59 | Admitting: Hematology

## 2023-10-12 ENCOUNTER — Encounter: Payer: Self-pay | Admitting: Hematology

## 2023-10-12 VITALS — BP 124/89 | HR 84 | Temp 98.2°F | Resp 18 | Wt 190.7 lb

## 2023-10-12 DIAGNOSIS — Z800 Family history of malignant neoplasm of digestive organs: Secondary | ICD-10-CM | POA: Diagnosis not present

## 2023-10-12 DIAGNOSIS — R5383 Other fatigue: Secondary | ICD-10-CM

## 2023-10-12 DIAGNOSIS — C20 Malignant neoplasm of rectum: Secondary | ICD-10-CM

## 2023-10-12 DIAGNOSIS — G629 Polyneuropathy, unspecified: Secondary | ICD-10-CM | POA: Diagnosis not present

## 2023-10-12 LAB — TSH: TSH: 5.84 u[IU]/mL — ABNORMAL HIGH (ref 0.350–4.500)

## 2023-10-12 LAB — IRON AND TIBC
Iron: 75 ug/dL (ref 45–182)
Saturation Ratios: 18 % (ref 17.9–39.5)
TIBC: 407 ug/dL (ref 250–450)
UIBC: 332 ug/dL

## 2023-10-12 LAB — FERRITIN: Ferritin: 59 ng/mL (ref 24–336)

## 2023-10-12 MED ORDER — LOPERAMIDE HCL 2 MG PO CAPS: 2.0000 mg | ORAL_CAPSULE | ORAL | 3 refills | Status: DC | PRN

## 2023-10-12 NOTE — Progress Notes (Signed)
[facility] [address], [place] [address]    Clinic Day:  10/12/2023  Referring physician: [facility]  Patient Care Team: [facility] as PCP - General Troy Fus, MD as PCP - Cardiology (Cardiology) Troy Massed, MD as Medical Oncologist (Medical Oncology) Troy Sarah, RN as Oncology Nurse Navigator (Medical Oncology)   ASSESSMENT & PLAN:   Assessment: 1.  Stage IIIB (T3cN1) rectal adenocarcinoma, MSI stable: - Presentation with rectal bleeding - Colonoscopy (06/08/2022): Fungating partially obstructing large mass found in the rectum 11 cm from anal verge.  Partially circumferential involving one half of the lumen.  Mass measured 3 cm in length.  Diameter 2 cm.  Oozing present.  1 semipedunculated polyp (40 cm from anus) and 3 sessile polyps in the cecum removed. - Pathology (06/08/2022): Rectal mass 11 cm from anal verge-adenocarcinoma.  MMR preserved.  MSI-stable. - CEA (06/08/2022): 2.9 - MRI pelvis (06/10/2022): T3CN1, tumor extends into sigmoid from the high rectum, extension through muscularis propria, approximately 6 mm beyond the rectal wall.  Tumor begins in the highly portion of the rectum and extends into the sigmoid colon.  Extramural vascular invasion/tumor thrombus along the right lateral margin.  Shortest distance of tumor from mesorectal fascia: 15 mm.  Single high mesorectal or superior rectal lymph node measuring 9 mm.  Distance from tumor to internal anal sphincter is approximately 7-11 cm. - CT CAP (06/10/2022): Circumferential wall thickening of the upper/mid rectum, mildly enlarged high perirectal lymph nodes measuring up to 6 mm in short axis.  No evidence of distant metastatic disease in the chest, abdomen or pelvis. - Neoadjuvant FOLFOX 8 cycles from 06/29/2022 through 10/05/2022 - MRI pelvis (10/13/2022): Evidence of decreased extramural vascular invasion along the right lateral margin.  Tumor  length 3.4 cm, previously 4.5 cm.  Significant interval decrease thickening of high rectum/low sigmoid colon.  Mesorectal lymph nodes 7 mm, previously 9 mm in 4 mm node.  I discussed with radiologist who felt that there is more than 20% clinical improvement. - I have recommended foregoing radiation based on Prospect trial data.  He met with Dr. Cliffton Dillon and Dr. Mitzi Dillon who have recommended chemoradiation. - Chemoradiation with Xeloda from 11/11/2022 through 12/18/2022. - Robotic assisted low anterior resection on 02/17/2023 by Dr. Cliffton Dillon - Pathology: [id number], 2/22 lymph nodes involved, margins negative, residual moderate differentiated adenocarcinoma discontinuously involving a fibrotic area of about 2.2 cm.  MMR preserved.   2.  Social/family history: - He lives at home with his wife Troy Dillon.  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  Denies any chemical exposure.  Does not smoke cigarettes but dips tobacco. - Father had colon cancer at age 34. - Paternal grandmother died of colon cancer. - Paternal aunt had colon cancer. - Mother had "male cancer" and underwent hysterectomy.    Plan: 1.  Stage IIIb (T3CN1) rectal adenocarcinoma, MSI-stable: - He reports about 5-6 loose bowel movements per day.  He takes 1 Imodium tablet 2 mg daily which improves his bowel movements to twice a day. - Reviewed labs from 10/05/2023: LFTs and creatinine normal.  CBC grossly normal with mild leukopenia.  CEA is 1.5. - Recommend follow-up in 3 months with repeat CT CAP with contrast and CEA level. - Will make a referral to our GI group for colonoscopy which should be repeated 1 year after initial diagnosis. - He reports tiredness which is worsening in the last 3 to 4 months.  He started  taking a multivitamin tablet.  Will check ferritin, iron panel, TSH and testosterone levels today.   2.  Peripheral neuropathy: - He has constant numbness in the right hand first 3 fingers.  He has on and off numbness in the toes.  No  intervention necessary.     Orders Placed This Encounter  Procedures   CT CHEST ABDOMEN PELVIS W CONTRAST    Standing Status:   Future    Expected Date:   01/09/2024    Expiration Date:   10/11/2024    If indicated for the ordered procedure, I authorize the administration of contrast media per Radiology protocol:   Yes    Does the patient have a contrast media/X-ray dye allergy?:   No    Preferred imaging location?:   [name]    If indicated for the ordered procedure, I authorize the administration of oral contrast media per Radiology protocol:   Yes   Iron and TIBC ([facility] ONLY)    Standing Status:   Future    Expected Date:   10/12/2023    Expiration Date:   10/11/2024   Ferritin    Standing Status:   Future    Expected Date:   10/12/2023    Expiration Date:   10/11/2024   TSH    Standing Status:   Future    Expected Date:   10/12/2023    Expiration Date:   10/11/2024   Testosterone    Standing Status:   Future    Expected Date:   10/12/2023    Expiration Date:   10/11/2024      Troy Dillon,acting as a scribe for Troy Massed, MD.,have documented all relevant documentation on the behalf of Troy Massed, MD,as directed by  Troy Massed, MD while in the presence of Troy Massed, MD.  I, Troy Massed MD, have reviewed the above documentation for accuracy and completeness, and I agree with the above.      Troy Massed, MD   2/18/20253:53 PM  CHIEF COMPLAINT:   Diagnosis: stage III rectal cancer     Cancer Staging  Rectal carcinoma [facility]) Staging form: Colon and Rectum, AJCC 8th Edition - Clinical stage from 06/18/2022: Stage IIIB (cT3, cN1, cM0) - Unsigned    Prior Therapy: 1. FOLFOX 06/29/22 - 10/05/22 2. Xeloda, radiation 11/11/22 - 12/18/22  Current Therapy: Surgery on 01/28/2023   HISTORY OF PRESENT ILLNESS:   Oncology History  Rectal carcinoma (HCC)  06/17/2022 Initial Diagnosis    Rectal carcinoma (HCC)   06/29/2022 -  Chemotherapy   Patient is on Treatment Plan : COLORECTAL FOLFOX q14d x 4 months      Genetic Testing   [facility] Common Cancer Panel+RNA was Negative. Report date is 08/29/2022.  The Common Hereditary Cancers Panel offered by [facility] includes sequencing and/or deletion duplication testing of the following 48 genes: APC, ATM, AXIN2, BAP1, BARD1, BMPR1A, BRCA1, BRCA2, BRIP1, CDH1, CDK4, CDKN2A (p14ARF and p16INK4a only), CHEK2, CTNNA1, DICER1, EPCAM (Deletion/duplication testing only), FH, GREM1 (promoter region duplication testing only), HOXB13, KIT, MBD4, MEN1, MLH1, MSH2, MSH3, MSH6, MUTYH, NF1, NHTL1, PALB2, PDGFRA, PMS2, POLD1, POLE, PTEN, RAD51C, RAD51D, SDHA (sequencing analysis only except exon 14), SDHB, SDHC, SDHD, SMAD4, SMARCA4. STK11, TP53, TSC1, TSC2, and VHL.      INTERVAL HISTORY:   Troy Dillon is a 53 y.o. male presenting to clinic today for follow up of stage III rectal cancer. He was last seen by me on 07/06/23.  Today, he states that he is  doing well overall. His appetite level is at 100%. His energy level is at 50%.  He is accompanied by his wife. Troy Dillon reports diarrhea and requires Imodium OD since low anterior resection on 01/28/23. When taking Imodium, he has 2 BM's a day. He denies any new pains. Neuropathy is stable in the form of numbness in the first 3 fingers of the right hand and on and off numbness in the toes. Troy Dillon denies dropping objects or any difficulty holding objects.   He reports tiredness and decreased energy for the last 3-4 months and would like to know what he can do to improve symptoms. Troy Dillon started a multivitamin which slightly improved symptoms.  PAST MEDICAL HISTORY:   Past Medical History: Past Medical History:  Diagnosis Date   Anemia    Anxiety    Depression    Neuromuscular disorder (HCC)    Neuropathy d/t chemo   Rectal cancer (HCC) 06/08/2022    Surgical History: Past Surgical History:  Procedure Laterality  Date   COLONOSCOPY  06/08/2022   EYE SURGERY     FLEXIBLE SIGMOIDOSCOPY N/A 02/17/2023   Procedure: FLEXIBLE SIGMOIDOSCOPY;  Surgeon: Andria Meuse, MD;  Location: [facility];  Service: General;  Laterality: N/A;   FLEXIBLE SIGMOIDOSCOPY N/A 03/22/2023   Procedure: Arnell Sieving;  Surgeon: Andria Meuse, MD;  Location: [facility];  Service: General;  Laterality: N/A;   FRACTURE SURGERY     Right Femur- Cables in bone remain in place, rod removed in 2000   ILEOSTOMY CLOSURE N/A 05/14/2023   Procedure: LOOP ILEOSTOMY TAKEDOWN;  Surgeon: Andria Meuse, MD;  Location: [facility];  Service: General;  Laterality: N/A;   IR IMAGING GUIDED PORT INSERTION  06/22/2022   ORTHOPEDIC SURGERY     PORT-A-CATH REMOVAL N/A 05/14/2023   Procedure: REMOVAL PORT-A-CATH;  Surgeon: Andria Meuse, MD;  Location: [facility];  Service: General;  Laterality: N/A;   XI ROBOTIC ASSISTED LOWER ANTERIOR RESECTION Bilateral 02/17/2023   Procedure: XI ROBOTIC ASSISTED LOWER ANTERIOR RESECTION, DIVERTING LOOP ILEOSTOMY, BILATERAL TAP BLOCK, TISSUE PERFUSION ASSESSMENT VIA FIREFLY INJECTION;  Surgeon: Andria Meuse, MD;  Location: [facility];  Service: General;  Laterality: Bilateral;    Social History: Social History   Socioeconomic History   Marital status: Married    Spouse name: Not on file   Number of children: Not on file   Years of education: Not on file   Highest education level: Not on file  Occupational History   Not on file  Tobacco Use   Smoking status: Former    Current packs/day: 0.00    Average packs/day: 1 pack/day for 15.0 years (15.0 ttl pk-yrs)    Types: Cigarettes    Start date: 75    Quit date: 2003    Years since quitting: 22.1   Smokeless tobacco: Former    Types: Snuff   Tobacco comments:    Some Dipping  Vaping Use   Vaping status: Never Used  Substance and Sexual Activity   Alcohol use: Yes    Comment: occasionally   Drug use: Never   Sexual  activity: Not on file  Other Topics Concern   Not on file  Social History Narrative   Not on file   Social Drivers of Health   Financial Resource Strain: Not on file  Food Insecurity: No Food Insecurity (05/16/2023)   Hunger Vital Sign    Worried About Running Out of Food in the Last Year: Never true  Ran Out of Food in the Last Year: Never true  Transportation Needs: No Transportation Needs (05/16/2023)   PRAPARE - Administrator, Civil Service (Medical): No    Lack of Transportation (Non-Medical): No  Physical Activity: Not on file  Stress: Not on file  Social Connections: Not on file  Intimate Partner Violence: Not At Risk (05/16/2023)   Humiliation, Afraid, Rape, and Kick questionnaire    Fear of Current or Ex-Partner: No    Emotionally Abused: No    Physically Abused: No    Sexually Abused: No    Family History: Family History  Problem Relation Age of Onset   Cancer Mother 66 - 48       GYN malignancy details unknown, TAH/BSO   Colon cancer Father 80   Heart attack Father    Colon cancer Paternal Aunt 19   Colon cancer Paternal Grandmother 75    Current Medications:  Current Outpatient Medications:    escitalopram (LEXAPRO) 10 MG tablet, Take 1 tablet (10 mg total) by mouth daily., Disp: 90 tablet, Rfl: 3   loperamide (IMODIUM) 2 MG capsule, Take 1 capsule (2 mg total) by mouth 3 (three) times daily., Disp: 30 capsule, Rfl: 0   loperamide (IMODIUM) 2 MG capsule, Take 1 capsule (2 mg total) by mouth as needed for diarrhea or loose stools., Disp: 60 capsule, Rfl: 3   Allergies: Allergies  Allergen Reactions   Hydrocodone Itching   Latex     Eye irritation     REVIEW OF SYSTEMS:   Review of Systems  Constitutional:  Positive for fatigue. Negative for chills and fever.  HENT:   Negative for lump/mass, mouth sores, nosebleeds, sore throat and trouble swallowing.   Eyes:  Negative for eye problems.  Respiratory:  Negative for cough and shortness of  breath.   Cardiovascular:  Negative for chest pain, leg swelling and palpitations.  Gastrointestinal:  Positive for diarrhea. Negative for abdominal pain, constipation, nausea and vomiting.  Genitourinary:  Negative for bladder incontinence, difficulty urinating, dysuria, frequency, hematuria and nocturia.   Musculoskeletal:  Negative for arthralgias, back pain, flank pain, myalgias and neck pain.  Skin:  Negative for itching and rash.  Neurological:  Positive for numbness (in fingers). Negative for dizziness and headaches.  Hematological:  Does not bruise/bleed easily.  Psychiatric/Behavioral:  Negative for depression, sleep disturbance and suicidal ideas. The patient is not nervous/anxious.   All other systems reviewed and are negative.    VITALS:   Blood pressure 124/89, pulse 84, temperature 98.2 F (36.8 C), temperature source Oral, resp. rate 18, weight 190 lb 11.2 oz (86.5 kg), SpO2 98%.  Wt Readings from Last 3 Encounters:  10/12/23 190 lb 11.2 oz (86.5 kg)  07/06/23 182 lb 12.8 oz (82.9 kg)  05/16/23 171 lb 8.3 oz (77.8 kg)    Body mass index is 28.16 kg/m.  Performance status (ECOG): 0 - Asymptomatic  PHYSICAL EXAM:   Physical Exam Vitals and nursing note reviewed. Exam conducted with a chaperone present.  Constitutional:      Appearance: Normal appearance.  Cardiovascular:     Rate and Rhythm: Normal rate and regular rhythm.     Pulses: Normal pulses.     Heart sounds: Normal heart sounds.  Pulmonary:     Effort: Pulmonary effort is normal.     Breath sounds: Normal breath sounds.  Abdominal:     Palpations: Abdomen is soft. There is no hepatomegaly, splenomegaly or mass.     Tenderness:  There is no abdominal tenderness.  Musculoskeletal:     Right lower leg: No edema.     Left lower leg: No edema.  Lymphadenopathy:     Cervical: No cervical adenopathy.     Right cervical: No superficial, deep or posterior cervical adenopathy.    Left cervical: No  superficial, deep or posterior cervical adenopathy.     Upper Body:     Right upper body: No supraclavicular or axillary adenopathy.     Left upper body: No supraclavicular or axillary adenopathy.  Neurological:     General: No focal deficit present.     Mental Status: He is alert and oriented to person, place, and time.  Psychiatric:        Mood and Affect: Mood normal.        Behavior: Behavior normal.     LABS:      Latest Ref Rng & Units 10/05/2023    3:03 PM 06/22/2023    1:53 PM 05/16/2023    5:22 AM  CBC  WBC 4.0 - 10.5 K/uL 3.6  3.4  6.0   Hemoglobin 13.0 - 17.0 g/dL 24.4  01.0  27.2   Hematocrit 39.0 - 52.0 % 41.9  42.5  41.7   Platelets 150 - 400 K/uL 164  158  141       Latest Ref Rng & Units 10/05/2023    3:03 PM 06/22/2023    1:53 PM 05/16/2023    5:22 AM  CMP  Glucose 70 - 99 mg/dL 536  644  034   BUN 6 - 20 mg/dL 16  16  9    Creatinine 0.61 - 1.24 mg/dL 7.42  5.95  6.38   Sodium 135 - 145 mmol/L 134  137  135   Potassium 3.5 - 5.1 mmol/L 4.2  3.8  4.0   Chloride 98 - 111 mmol/L 103  101  102   CO2 22 - 32 mmol/L 24  28  30    Calcium 8.9 - 10.3 mg/dL 8.8  8.6  8.2   Total Protein 6.5 - 8.1 g/dL 7.4  7.4    Total Bilirubin 0.0 - 1.2 mg/dL 1.0  0.7    Alkaline Phos 38 - 126 U/L 100  113    AST 15 - 41 U/L 19  20    ALT 0 - 44 U/L 15  16       Lab Results  Component Value Date   CEA1 1.5 10/05/2023   /  CEA  Date Value Ref Range Status  10/05/2023 1.5 0.0 - 4.7 ng/mL Final    Comment:    (NOTE)                             Nonsmokers          <3.9                             Smokers             <5.6 [employer] Electrochemiluminescence Immunoassay (ECLIA) Values obtained with different assay methods or kits cannot be used interchangeably.  Results cannot be interpreted as absolute evidence of the presence or absence of malignant disease. Performed At: [facility] [address] [place], [place] [id number] Jolene Schimke MD  [id number]    No results found for: "PSA1" No results found for: "CAN199" No results found for: "CAN125"  No results found for: "TOTALPROTELP", "ALBUMINELP", "A1GS", "A2GS", "BETS", "BETA2SER", "GAMS", "MSPIKE", "SPEI" No results found for: "TIBC", "FERRITIN", "IRONPCTSAT" No results found for: "LDH"   STUDIES:   No results found.

## 2023-10-13 ENCOUNTER — Other Ambulatory Visit: Payer: Self-pay

## 2023-10-13 LAB — SIGNATERA
SIGNATERA MTM READOUT: 0 MTM/ml
SIGNATERA TEST RESULT: NEGATIVE

## 2023-10-13 LAB — TESTOSTERONE: Testosterone: 405 ng/dL (ref 264–916)

## 2023-10-13 NOTE — Progress Notes (Signed)
Per Dr. Ellin Saba, patient called with lab results.  Verbalized understanding.

## 2023-10-14 ENCOUNTER — Encounter: Payer: Self-pay | Admitting: Hematology

## 2023-10-19 ENCOUNTER — Encounter (INDEPENDENT_AMBULATORY_CARE_PROVIDER_SITE_OTHER): Payer: Self-pay | Admitting: *Deleted

## 2023-10-20 ENCOUNTER — Ambulatory Visit (INDEPENDENT_AMBULATORY_CARE_PROVIDER_SITE_OTHER): Payer: No Typology Code available for payment source | Admitting: Gastroenterology

## 2023-10-20 ENCOUNTER — Encounter (INDEPENDENT_AMBULATORY_CARE_PROVIDER_SITE_OTHER): Payer: Self-pay

## 2023-10-20 ENCOUNTER — Encounter (INDEPENDENT_AMBULATORY_CARE_PROVIDER_SITE_OTHER): Payer: Self-pay | Admitting: Gastroenterology

## 2023-10-20 VITALS — BP 120/79 | HR 69 | Temp 97.8°F | Ht 69.0 in | Wt 189.8 lb

## 2023-10-20 DIAGNOSIS — Z85048 Personal history of other malignant neoplasm of rectum, rectosigmoid junction, and anus: Secondary | ICD-10-CM

## 2023-10-20 DIAGNOSIS — K909 Intestinal malabsorption, unspecified: Secondary | ICD-10-CM | POA: Insufficient documentation

## 2023-10-20 DIAGNOSIS — Z860101 Personal history of adenomatous and serrated colon polyps: Secondary | ICD-10-CM

## 2023-10-20 DIAGNOSIS — Z08 Encounter for follow-up examination after completed treatment for malignant neoplasm: Secondary | ICD-10-CM | POA: Diagnosis not present

## 2023-10-20 DIAGNOSIS — Z87891 Personal history of nicotine dependence: Secondary | ICD-10-CM

## 2023-10-20 DIAGNOSIS — Z800 Family history of malignant neoplasm of digestive organs: Secondary | ICD-10-CM

## 2023-10-20 DIAGNOSIS — C20 Malignant neoplasm of rectum: Secondary | ICD-10-CM

## 2023-10-20 MED ORDER — PEG 3350-KCL-NA BICARB-NACL 420 G PO SOLR: 4000.0000 mL | Freq: Once | ORAL | 0 refills | Status: AC

## 2023-10-20 MED ORDER — PSYLLIUM 58.6 % PO PACK: 1.0000 | PACK | Freq: Two times a day (BID) | ORAL | 2 refills | Status: AC

## 2023-10-20 NOTE — H&P (View-Only) (Signed)
 Troy Dillon , M.D. Gastroenterology & Hepatology [facility] [address] [place], [place] [address] Primary Care Physician: Practice, [facility] [address] [place] [address]  Chief Complaint:  History of Rectal Cancer; surveillance colonoscopy   History of Present Illness:  Troy Dillon is a 53 y.o. male with history of stage III rectal cancer diagnosed 05/2022 at [facility] health followed by LAR 01/2023 , Loop ileostomy reversal 03/2023 , follows with Oncology at [facility] and Surgery at [facility] who presents for evaluation of surveillance colonoscopy .  Patient reports that he would have 5-6 liquid bowel movements if he does not take imodium .  He was seen by [facility] surgery and was told this is self-limiting and would get better. other than that patient does not have any active complaints.The patient denies having any nausea, vomiting, fever, chills, hematochezia, melena, hematemesis, abdominal distention, abdominal pain,  jaundice, pruritus or weight loss.  Last UJW:JXBJ  Last Colonoscopy: Flexible sig:  02/2023 at [facility]  - Patent end- to- end colo- colonic anastomosis, characterized by healthy appearing mucosa. No specimens collected.  05/2022 at [facility]    - Malignant partially obstructing tumor in the rectum.                         Biopsied. Tattooed.                         - One large polyp at 40 cm proximal to the anus,                         removed with a hot snare. Resected and retrieved.                         Clips were placed.                         - Three small polyps in the cecum, removed with a hot                         snare. Resected and retrieved.   Specimen 1: Colon, polyp(s), 40cm -  TUBULOVILLOUS ADENOMA, TWO FRAGMENTS.NO HIGH GRADE DYSPLASIA OR MALIGNANCY. Specimen 2: Colon, polyp(s), cecal -  TUBULAR ADENOMA, ONE FRAGMENT.NO HIGH GRADE DYSPLASIA OR MALIGNANCY. -  COLONIC  MUCOSA, ONE FRAGMENT.  NO DYSPLASIA OR MALIGNANCY. Specimen 3: Colon, polyp(s), cecal -  TUBULAR ADENOMA, ONE FRAGMENT.NO HIGH GRADE DYSPLASIA OR MALIGNANCY. Specimen 4: Colon, polyp(s), cecal -  TUBULAR ADENOMA, ONE FRAGMENT.NO HIGH GRADE DYSPLASIA OR MALIGNANCY. Specimen 5: Colon, biopsy, mass in rectum 11cm from anal verge -  ADENOCARCINOMA.SEE COMMENT.  Robotic LAR/DLI 02/17/23 PATH [id number] margin negative moderately differentiated adenocarcinoma   FHx: Father and paternal grandparents had colon cancer. Denies any known family history of colorectal, breast, endometrial or ovarian cancer  Social Hx: Denies use of tobacco/drugs; social EtOH use. Here today with his wife. 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[place], [place]    Past Medical History: Past Medical History:  Diagnosis Date   Anemia    Anxiety    Depression    Neuromuscular disorder (HCC)    Neuropathy d/t chemo   Rectal cancer (HCC) 06/08/2022    Past Surgical History: Past Surgical History:  Procedure Laterality Date   COLONOSCOPY  06/08/2022   EYE SURGERY     FLEXIBLE SIGMOIDOSCOPY N/A 02/17/2023   Procedure: FLEXIBLE SIGMOIDOSCOPY;  Surgeon: Andria Meuse, MD;  Location: [facility];  Service: General;  Laterality: N/A;   FLEXIBLE SIGMOIDOSCOPY N/A 03/22/2023   Procedure: FLEXIBLE SIGMOIDOSCOPY;  Surgeon: Andria Meuse, MD;  [facility];  Service: General;  Laterality: N/A;   FRACTURE SURGERY     Right Femur- Cables in bone remain in place, rod removed in 2000   ILEOSTOMY CLOSURE N/A 05/14/2023   Procedure: LOOP ILEOSTOMY TAKEDOWN;  Surgeon: Andria Meuse, MD;  Location: [facility];  Service: General;  Laterality: N/A;   IR IMAGING GUIDED PORT INSERTION  06/22/2022   ORTHOPEDIC SURGERY     PORT-A-CATH REMOVAL N/A 05/14/2023   Procedure: REMOVAL PORT-A-CATH;  Surgeon: Andria Meuse, MD;  Location: [facility];  Service: General;  Laterality: N/A;   XI ROBOTIC ASSISTED LOWER ANTERIOR RESECTION Bilateral 02/17/2023    Procedure: XI ROBOTIC ASSISTED LOWER ANTERIOR RESECTION, DIVERTING LOOP ILEOSTOMY, BILATERAL TAP BLOCK, TISSUE PERFUSION ASSESSMENT VIA FIREFLY INJECTION;  Surgeon: Andria Meuse, MD;  Location: [facility];  Service: General;  Laterality: Bilateral;    Family History: Family History  Problem Relation Age of Onset   Cancer Mother 69 - 18       GYN malignancy details unknown, TAH/BSO   Colon cancer Father 24   Heart attack Father    Colon cancer Paternal Aunt 81   Colon cancer Paternal Grandmother 88    Social History: Social History   Tobacco Use  Smoking Status Former   Current packs/day: 0.00   Average packs/day: 1 pack/day for 15.0 years (15.0 ttl pk-yrs)   Types: Cigarettes   Start date: 75   Quit date: 2003   Years since quitting: 22.1   Passive exposure: Past  Smokeless Tobacco Former   Types: Snuff  Tobacco Comments   Some Dipping   Social History   Substance and Sexual Activity  Alcohol Use Yes   Comment: occasionally   Social History   Substance and Sexual Activity  Drug Use Never    Allergies: Allergies  Allergen Reactions   Hydrocodone Itching   Latex     Eye irritation     Medications: Current Outpatient Medications  Medication Sig Dispense Refill   escitalopram (LEXAPRO) 10 MG tablet Take 1 tablet (10 mg total) by mouth daily. 90 tablet 3   loperamide (IMODIUM) 2 MG capsule Take 1 capsule (2 mg total) by mouth as needed for diarrhea or loose stools. 60 capsule 3   Multiple Vitamin (MULTIVITAMIN) tablet Take 1 tablet by mouth daily.     PRESCRIPTION MEDICATION Prescription eye drop for glaucoma.     No current facility-administered medications for this visit.    Review of Systems: GENERAL: negative for malaise, night sweats HEENT: No changes in hearing or vision, no nose bleeds or other nasal problems. NECK: Negative for lumps, goiter, pain and significant neck swelling RESPIRATORY: Negative for cough, wheezing CARDIOVASCULAR:  Negative for chest pain, leg swelling, palpitations, orthopnea GI: SEE HPI MUSCULOSKELETAL: Negative for joint pain or swelling, back pain, and muscle pain. SKIN: Negative for lesions, rash HEMATOLOGY Negative for prolonged bleeding, bruising easily, and swollen nodes. ENDOCRINE: Negative for cold or heat intolerance, polyuria, polydipsia and goiter. NEURO: negative for tremor, gait imbalance, syncope and seizures. The remainder of the review of systems is noncontributory.   Physical Exam: BP 120/79   Pulse 69   Temp 97.8 F (36.6 C)  Ht 5\' 9"  (1.753 m)   Wt 189 lb 12.8 oz (86.1 kg)   BMI 28.03 kg/m  GENERAL: The patient is AO x3, in no acute distress. HEENT: Head is normocephalic and atraumatic. EOMI are intact. Mouth is well hydrated and without lesions. NECK: Supple. No masses LUNGS: Clear to auscultation. No presence of rhonchi/wheezing/rales. Adequate chest expansion HEART: RRR, normal s1 and s2. ABDOMEN: Soft, nontender, no guarding, no peritoneal signs, and nondistended. BS +. No masses.  Imaging/Labs: as above     Latest Ref Rng & Units 10/05/2023    3:03 PM 06/22/2023    1:53 PM 05/16/2023    5:22 AM  CBC  WBC 4.0 - 10.5 K/uL 3.6  3.4  6.0   Hemoglobin 13.0 - 17.0 g/dL 82.9  56.2  13.0   Hematocrit 39.0 - 52.0 % 41.9  42.5  41.7   Platelets 150 - 400 K/uL 164  158  141    Lab Results  Component Value Date   IRON 75 10/12/2023   TIBC 407 10/12/2023   FERRITIN 59 10/12/2023    I personally reviewed and interpreted the available labs, imaging and endoscopic files.  Ct chest, abdomen and pelvis  IMPRESSION: 1. No new or progressive findings in the chest, abdomen or pelvis. 2. No significant interval change in the high rectal/low sigmoid wall thickening. 3. Continued decrease in size of the mesorectal/sigmoid mesentery lymph nodes which are now too small to definitively characterize previously measuring up to 3 mm. 4. Stable 2-3 mm right lower lobe  pulmonary nodule. Resolved right anterior upper lobe micronodules. No new pulmonary nodules or masses.   Impression and Plan:  Troy Dillon is a 53 y.o. male with history of stage III rectal cancer diagnosed 05/2022 at [facility] followed by LAR 01/2023 , Loop ileostomy reversal 03/2023 , follows with Oncology at [facility] and Surgery at [facility] who presents for evaluation of surveillance colonoscopy .  #History of stage III rectal cancer #History of advance adenomas ( TVA)  Patient last completed colonoscopy was 05/2022.  Posttreatment colonoscopy to be repeated 1 year after diagnosis and hence patient is due.  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   Also recommended first-degree relative to pursue screening colonoscopy at least age 26 or earlier if any symptoms  All questions were answered.      Troy Lawman, MD Gastroenterology and Hepatology [facility]astroenter[facility]   This chart has been completed using [facility] Dictation software, and while attempts have been made to ensure accuracy , certain words and phrases may not be transcribed as intended

## 2023-10-20 NOTE — Patient Instructions (Signed)
 It was very nice to meet you today, as dicussed with will plan for the following :  1) colonoscopy

## 2023-10-20 NOTE — Progress Notes (Signed)
 Troy Dillon , M.D. Gastroenterology & Hepatology [facility]enterology [address] [place], [place] [address] Primary Care Physician: Practice, [facility] [address] [place] [address]  Chief Complaint:  History of Rectal Cancer; surveillance colonoscopy   History of Present Illness:  Troy Dillon is a 53 y.o. male with history of stage III rectal cancer diagnosed 05/2022 at [facility] health followed by LAR 01/2023 , Loop ileostomy reversal 03/2023 , follows with Oncology at [facility] and Surgery at [facility] who presents for evaluation of surveillance colonoscopy .  Patient reports that he would have 5-6 liquid bowel movements if he does not take imodium .  He was seen by [facility] surgery and was told this is self-limiting and would get better. other than that patient does not have any active complaints.The patient denies having any nausea, vomiting, fever, chills, hematochezia, melena, hematemesis, abdominal distention, abdominal pain,  jaundice, pruritus or weight loss.  Last UJW:JXBJ  Last Colonoscopy: Flexible sig:  02/2023 at [facility]  - Patent end- to- end colo- colonic anastomosis, characterized by healthy appearing mucosa. No specimens collected.  05/2022 at [facility]    - Malignant partially obstructing tumor in the rectum.                         Biopsied. Tattooed.                         - One large polyp at 40 cm proximal to the anus,                         removed with a hot snare. Resected and retrieved.                         Clips were placed.                         - Three small polyps in the cecum, removed with a hot                         snare. Resected and retrieved.   Specimen 1: Colon, polyp(s), 40cm -  TUBULOVILLOUS ADENOMA, TWO FRAGMENTS.NO HIGH GRADE DYSPLASIA OR MALIGNANCY. Specimen 2: Colon, polyp(s), cecal -  TUBULAR ADENOMA, ONE FRAGMENT.NO HIGH GRADE DYSPLASIA OR MALIGNANCY. -  COLONIC  MUCOSA, ONE FRAGMENT.  NO DYSPLASIA OR MALIGNANCY. Specimen 3: Colon, polyp(s), cecal -  TUBULAR ADENOMA, ONE FRAGMENT.NO HIGH GRADE DYSPLASIA OR MALIGNANCY. Specimen 4: Colon, polyp(s), cecal -  TUBULAR ADENOMA, ONE FRAGMENT.NO HIGH GRADE DYSPLASIA OR MALIGNANCY. Specimen 5: Colon, biopsy, mass in rectum 11cm from anal verge -  ADENOCARCINOMA.SEE COMMENT.  Robotic LAR/DLI 02/17/23 PATH [id number] margin negative moderately differentiated adenocarcinoma   FHx: Father and paternal grandparents had colon cancer. Denies any known family history of colorectal, breast, endometrial or ovarian cancer  Social Hx: Denies use of tobacco/drugs; social EtOH use. Here today with his wife. 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[place], [place]    Past Medical History: Past Medical History:  Diagnosis Date   Anemia    Anxiety    Depression    Neuromuscular disorder (HCC)    Neuropathy d/t chemo   Rectal cancer (HCC) 06/08/2022    Past Surgical History: Past Surgical History:  Procedure Laterality Date   COLONOSCOPY  06/08/2022   EYE SURGERY     FLEXIBLE SIGMOIDOSCOPY N/A 02/17/2023   Procedure: FLEXIBLE SIGMOIDOSCOPY;  Surgeon: Andria Meuse, MD;  [facility]: [facility];  Service: General;  Laterality: N/A;   FLEXIBLE SIGMOIDOSCOPY N/A 03/22/2023   Procedure: FLEXIBLE SIGMOIDOSCOPY;  Surgeon: Andria Meuse, MD;  [facility];  Service: General;  Laterality: N/A;   FRACTURE SURGERY     Right Femur- Cables in bone remain in place, rod removed in 2000   ILEOSTOMY CLOSURE N/A 05/14/2023   Procedure: LOOP ILEOSTOMY TAKEDOWN;  Surgeon: Andria Meuse, MD;  Location: [facility];  Service: General;  Laterality: N/A;   IR IMAGING GUIDED PORT INSERTION  06/22/2022   ORTHOPEDIC SURGERY     PORT-A-CATH REMOVAL N/A 05/14/2023   Procedure: REMOVAL PORT-A-CATH;  Surgeon: Andria Meuse, MD;  Location: [facility];  Service: General;  Laterality: N/A;   XI ROBOTIC ASSISTED LOWER ANTERIOR RESECTION Bilateral 02/17/2023    Procedure: XI ROBOTIC ASSISTED LOWER ANTERIOR RESECTION, DIVERTING LOOP ILEOSTOMY, BILATERAL TAP BLOCK, TISSUE PERFUSION ASSESSMENT VIA FIREFLY INJECTION;  Surgeon: Andria Meuse, MD;  Location: [facility];  Service: General;  Laterality: Bilateral;    Family History: Family History  Problem Relation Age of Onset   Cancer Mother 69 - 18       GYN malignancy details unknown, TAH/BSO   Colon cancer Father 24   Heart attack Father    Colon cancer Paternal Aunt 81   Colon cancer Paternal Grandmother 88    Social History: Social History   Tobacco Use  Smoking Status Former   Current packs/day: 0.00   Average packs/day: 1 pack/day for 15.0 years (15.0 ttl pk-yrs)   Types: Cigarettes   Start date: 75   Quit date: 2003   Years since quitting: 22.1   Passive exposure: Past  Smokeless Tobacco Former   Types: Snuff  Tobacco Comments   Some Dipping   Social History   Substance and Sexual Activity  Alcohol Use Yes   Comment: occasionally   Social History   Substance and Sexual Activity  Drug Use Never    Allergies: Allergies  Allergen Reactions   Hydrocodone Itching   Latex     Eye irritation     Medications: Current Outpatient Medications  Medication Sig Dispense Refill   escitalopram (LEXAPRO) 10 MG tablet Take 1 tablet (10 mg total) by mouth daily. 90 tablet 3   loperamide (IMODIUM) 2 MG capsule Take 1 capsule (2 mg total) by mouth as needed for diarrhea or loose stools. 60 capsule 3   Multiple Vitamin (MULTIVITAMIN) tablet Take 1 tablet by mouth daily.     PRESCRIPTION MEDICATION Prescription eye drop for glaucoma.     No current facility-administered medications for this visit.    Review of Systems: GENERAL: negative for malaise, night sweats HEENT: No changes in hearing or vision, no nose bleeds or other nasal problems. NECK: Negative for lumps, goiter, pain and significant neck swelling RESPIRATORY: Negative for cough, wheezing CARDIOVASCULAR:  Negative for chest pain, leg swelling, palpitations, orthopnea GI: SEE HPI MUSCULOSKELETAL: Negative for joint pain or swelling, back pain, and muscle pain. SKIN: Negative for lesions, rash HEMATOLOGY Negative for prolonged bleeding, bruising easily, and swollen nodes. ENDOCRINE: Negative for cold or heat intolerance, polyuria, polydipsia and goiter. NEURO: negative for tremor, gait imbalance, syncope and seizures. The remainder of the review of systems is noncontributory.   Physical Exam: BP 120/79   Pulse 69   Temp 97.8 F (36.6 C)  Ht 5\' 9"  (1.753 m)   Wt 189 lb 12.8 oz (86.1 kg)   BMI 28.03 kg/m  GENERAL: The patient is AO x3, in no acute distress. HEENT: Head is normocephalic and atraumatic. EOMI are intact. Mouth is well hydrated and without lesions. NECK: Supple. No masses LUNGS: Clear to auscultation. No presence of rhonchi/wheezing/rales. Adequate chest expansion HEART: RRR, normal s1 and s2. ABDOMEN: Soft, nontender, no guarding, no peritoneal signs, and nondistended. BS +. No masses.  Imaging/Labs: as above     Latest Ref Rng & Units 10/05/2023    3:03 PM 06/22/2023    1:53 PM 05/16/2023    5:22 AM  CBC  WBC 4.0 - 10.5 K/uL 3.6  3.4  6.0   Hemoglobin 13.0 - 17.0 g/dL 82.9  56.2  13.0   Hematocrit 39.0 - 52.0 % 41.9  42.5  41.7   Platelets 150 - 400 K/uL 164  158  141    Lab Results  Component Value Date   IRON 75 10/12/2023   TIBC 407 10/12/2023   FERRITIN 59 10/12/2023    I personally reviewed and interpreted the available labs, imaging and endoscopic files.  Ct chest, abdomen and pelvis  IMPRESSION: 1. No new or progressive findings in the chest, abdomen or pelvis. 2. No significant interval change in the high rectal/low sigmoid wall thickening. 3. Continued decrease in size of the mesorectal/sigmoid mesentery lymph nodes which are now too small to definitively characterize previously measuring up to 3 mm. 4. Stable 2-3 mm right lower lobe  pulmonary nodule. Resolved right anterior upper lobe micronodules. No new pulmonary nodules or masses.   Impression and Plan:  Troy Dillon is a 53 y.o. male with history of stage III rectal cancer diagnosed 05/2022 at [facility] followed by LAR 01/2023 , Loop ileostomy reversal 03/2023 , follows with Oncology at [facility] and Surgery at [facility] [place] who presents for evaluation of surveillance colonoscopy .  #History of stage III rectal cancer #History of advance adenomas ( TVA)  Patient last completed colonoscopy was 05/2022.  Posttreatment colonoscopy to be repeated 1 year after diagnosis and hence patient is due.  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   Also recommended first-degree relative to pursue screening colonoscopy at least age 26 or earlier if any symptoms  All questions were answered.      Troy Lawman, MD Gastroenterology and Hepatology [facility] Gastroenterology   This chart has been completed using [facility] Dictation software, and while attempts have been made to ensure accuracy , certain words and phrases may not be transcribed as intended

## 2023-10-21 ENCOUNTER — Telehealth (INDEPENDENT_AMBULATORY_CARE_PROVIDER_SITE_OTHER): Payer: Self-pay | Admitting: *Deleted

## 2023-10-21 NOTE — Telephone Encounter (Signed)
 Patient left message - his eye drops he takes is [place]

## 2023-10-21 NOTE — Telephone Encounter (Signed)
Thanks  Added to med list

## 2023-10-21 NOTE — Addendum Note (Signed)
 Addended by: Metro Kung on: 10/21/2023 03:56 PM   Modules accepted: Orders

## 2023-11-04 ENCOUNTER — Encounter (HOSPITAL_COMMUNITY)
Admission: RE | Admit: 2023-11-04 | Discharge: 2023-11-04 | Disposition: A | Discharge status: Home / Self Care | Payer: No Typology Code available for payment source | Source: Ambulatory Visit | Attending: Gastroenterology | Admitting: Gastroenterology

## 2023-11-04 NOTE — Pre-Procedure Instructions (Signed)
 Attempted pre-op phone call. MB full and could not leave message.

## 2023-11-05 NOTE — Pre-Procedure Instructions (Signed)
 Attempted pre-op phone call, MB full.

## 2023-11-08 ENCOUNTER — Other Ambulatory Visit: Payer: Self-pay

## 2023-11-08 ENCOUNTER — Encounter (HOSPITAL_COMMUNITY): Payer: Self-pay | Admitting: Gastroenterology

## 2023-11-10 NOTE — Anesthesia Preprocedure Evaluation (Signed)
 Anesthesia Evaluation  Patient identified by MRN, date of birth, ID band Patient awake    Reviewed: Allergy & Precautions, H&P , NPO status , Patient's Chart, lab work & pertinent test results  Airway Mallampati: II  TM Distance: >3 FB Neck ROM: Full    Dental no notable dental hx. (+) Teeth Intact, Dental Advisory Given   Pulmonary neg pulmonary ROS, former smoker   Pulmonary exam normal breath sounds clear to auscultation       Cardiovascular negative cardio ROS Normal cardiovascular exam Rhythm:Regular Rate:Normal     Neuro/Psych  PSYCHIATRIC DISORDERS Anxiety Depression    Neuromuscular disorder  Neuromuscular disease  negative psych ROS   GI/Hepatic negative GI ROS, Neg liver ROS,,,  Endo/Other  negative endocrine ROS    Renal/GU negative Renal ROS  negative genitourinary   Musculoskeletal negative musculoskeletal ROS (+)    Abdominal   Peds negative pediatric ROS (+)  Hematology negative hematology ROS (+)   Anesthesia Other Findings Rectal cancer  Reproductive/Obstetrics negative OB ROS                             Anesthesia Physical Anesthesia Plan  ASA: 2  Anesthesia Plan: General   Post-op Pain Management: Minimal or no pain anticipated   Induction: Intravenous  PONV Risk Score and Plan: 2 and Propofol infusion  Airway Management Planned: Natural Airway and Nasal Cannula  Additional Equipment: None  Intra-op Plan:   Post-operative Plan:   Informed Consent: I have reviewed the patients History and Physical, chart, labs and discussed the procedure including the risks, benefits and alternatives for the proposed anesthesia with the patient or authorized representative who has indicated his/her understanding and acceptance.     Dental advisory given  Plan Discussed with: CRNA  Anesthesia Plan Comments:        Anesthesia Quick Evaluation

## 2023-11-11 ENCOUNTER — Encounter (HOSPITAL_COMMUNITY)
Admission: RE | Disposition: A | Discharge status: Home / Self Care | Payer: Self-pay | Source: Home / Self Care | Attending: Gastroenterology

## 2023-11-11 ENCOUNTER — Encounter (HOSPITAL_COMMUNITY): Payer: Self-pay | Admitting: Gastroenterology

## 2023-11-11 ENCOUNTER — Ambulatory Visit (HOSPITAL_BASED_OUTPATIENT_CLINIC_OR_DEPARTMENT_OTHER): Payer: Self-pay | Admitting: Anesthesiology

## 2023-11-11 ENCOUNTER — Ambulatory Visit (HOSPITAL_COMMUNITY)
Admission: RE | Admit: 2023-11-11 | Discharge: 2023-11-11 | Disposition: A | Discharge status: Home / Self Care | Payer: No Typology Code available for payment source | Attending: Gastroenterology | Admitting: Gastroenterology

## 2023-11-11 ENCOUNTER — Ambulatory Visit (HOSPITAL_COMMUNITY): Payer: Self-pay | Admitting: Anesthesiology

## 2023-11-11 DIAGNOSIS — Z87891 Personal history of nicotine dependence: Secondary | ICD-10-CM | POA: Insufficient documentation

## 2023-11-11 DIAGNOSIS — Z85048 Personal history of other malignant neoplasm of rectum, rectosigmoid junction, and anus: Secondary | ICD-10-CM | POA: Diagnosis not present

## 2023-11-11 DIAGNOSIS — K6289 Other specified diseases of anus and rectum: Secondary | ICD-10-CM

## 2023-11-11 DIAGNOSIS — Z1211 Encounter for screening for malignant neoplasm of colon: Secondary | ICD-10-CM | POA: Diagnosis not present

## 2023-11-11 DIAGNOSIS — F32A Depression, unspecified: Secondary | ICD-10-CM | POA: Diagnosis not present

## 2023-11-11 DIAGNOSIS — F419 Anxiety disorder, unspecified: Secondary | ICD-10-CM | POA: Diagnosis not present

## 2023-11-11 DIAGNOSIS — Z1212 Encounter for screening for malignant neoplasm of rectum: Secondary | ICD-10-CM | POA: Diagnosis not present

## 2023-11-11 DIAGNOSIS — Z800 Family history of malignant neoplasm of digestive organs: Secondary | ICD-10-CM | POA: Insufficient documentation

## 2023-11-11 DIAGNOSIS — K621 Rectal polyp: Secondary | ICD-10-CM

## 2023-11-11 DIAGNOSIS — Z980 Intestinal bypass and anastomosis status: Secondary | ICD-10-CM | POA: Diagnosis not present

## 2023-11-11 HISTORY — PX: COLONOSCOPY WITH PROPOFOL: SHX5780

## 2023-11-11 LAB — HM COLONOSCOPY

## 2023-11-11 SURGERY — COLONOSCOPY WITH PROPOFOL
Anesthesia: General

## 2023-11-11 MED ORDER — LACTATED RINGERS IV SOLN: INTRAVENOUS | Status: DC | PRN

## 2023-11-11 MED ORDER — SODIUM CHLORIDE 0.9% FLUSH: 3.0000 mL | Freq: Two times a day (BID) | INTRAVENOUS | Status: DC

## 2023-11-11 MED ORDER — SODIUM CHLORIDE 0.9% FLUSH: 3.0000 mL | INTRAVENOUS | Status: DC | PRN

## 2023-11-11 MED ORDER — PROPOFOL 500 MG/50ML IV EMUL
INTRAVENOUS | Status: DC | PRN
Start: 2023-11-11 — End: 2023-11-11
  Administered 2023-11-11: 150 ug/kg/min via INTRAVENOUS

## 2023-11-11 MED ORDER — LIDOCAINE HCL (CARDIAC) PF 100 MG/5ML IV SOSY
PREFILLED_SYRINGE | INTRAVENOUS | Status: DC | PRN
Start: 2023-11-11 — End: 2023-11-11
  Administered 2023-11-11: 50 mg via INTRATRACHEAL

## 2023-11-11 MED ORDER — PROPOFOL 10 MG/ML IV BOLUS
INTRAVENOUS | Status: DC | PRN
  Administered 2023-11-11: 20 mg via INTRAVENOUS

## 2023-11-11 NOTE — Interval H&P Note (Signed)
 History and Physical Interval Note:  11/11/2023 7:55 AM  Troy Dillon  has presented today for surgery, with the diagnosis of RECTAL CANCER SURVEILLANCE.  The various methods of treatment have been discussed with the patient and family. After consideration of risks, benefits and other options for treatment, the patient has consented to  Procedure(s) with comments: COLONOSCOPY WITH PROPOFOL (N/A) - 9:00AM;ASA 1-2 as a surgical intervention.  The patient's history has been reviewed, patient examined, no change in status, stable for surgery.  I have reviewed the patient's chart and labs.  Questions were answered to the patient's satisfaction.     Troy Dillon

## 2023-11-11 NOTE — Anesthesia Postprocedure Evaluation (Signed)
 Anesthesia Post Note  Patient: Troy Dillon  Procedure(s) Performed: COLONOSCOPY WITH PROPOFOL  Patient location during evaluation: Short Stay Anesthesia Type: General Level of consciousness: awake and alert Pain management: pain level controlled Vital Signs Assessment: post-procedure vital signs reviewed and stable Respiratory status: spontaneous breathing Cardiovascular status: blood pressure returned to baseline and stable Postop Assessment: no apparent nausea or vomiting Anesthetic complications: no   No notable events documented.   Last Vitals:  Vitals:   11/11/23 0721  BP: 121/87  Pulse: 65  Resp: 15  Temp: 37.1 C  SpO2: 99%    Last Pain:  Vitals:   11/11/23 0834  TempSrc:   PainSc: 0-No pain                 Danasha Melman

## 2023-11-11 NOTE — Discharge Instructions (Signed)

## 2023-11-11 NOTE — Transfer of Care (Signed)
 Immediate Anesthesia Transfer of Care Note  Patient: Troy Dillon  Procedure(s) Performed: COLONOSCOPY WITH PROPOFOL  Patient Location: Short Stay  Anesthesia Type:General  Level of Consciousness: awake  Airway & Oxygen Therapy: Patient Spontanous Breathing  Post-op Assessment: Report given to RN  Post vital signs: Reviewed and stable  Last Vitals:  Vitals Value Taken Time  BP    Temp    Pulse 71 11/11/23 0904  Resp 13 11/11/23 0904  SpO2 98 % 11/11/23 0904  Vitals shown include unfiled device data.  Last Pain:  Vitals:   11/11/23 0834  TempSrc:   PainSc: 0-No pain         Complications: No notable events documented.

## 2023-11-11 NOTE — Op Note (Signed)
 [facility] Patient Name: Troy Dillon Procedure Date: 11/11/2023 8:22 AM MRN: [id number] Date of Birth: 1971/02/09 Attending MD: Sanjuan Dame , MD, [id number] CSN: [id number] Age: 53 Admit Type: Outpatient Procedure:                Colonoscopy Indications:              High risk colon cancer surveillance: Personal                            history of rectal cancer Providers:                Sanjuan Dame, MD, Francoise Ceo RN, RN, Pandora Leiter, Technician Referring MD:              Medicines:                Monitored Anesthesia Care Complications:            No immediate complications. Estimated Blood Loss:     Estimated blood loss: none. Procedure:                Pre-Anesthesia Assessment:                           - Prior to the procedure, a History and Physical                            was performed, and patient medications and                            allergies were reviewed. The patient's tolerance of                            previous anesthesia was also reviewed. The risks                            and benefits of the procedure and the sedation                            options and risks were discussed with the patient.                            All questions were answered, and informed consent                            was obtained. Prior Anticoagulants: The patient has                            taken no anticoagulant or antiplatelet agents. ASA                            Grade Assessment: III - A patient with severe  systemic disease. After reviewing the risks and                            benefits, the patient was deemed in satisfactory                            condition to undergo the procedure.                           After obtaining informed consent, the colonoscope                            was passed under direct vision. Throughout the                            procedure, the patient's blood  pressure, pulse, and                            oxygen saturations were monitored continuously. The                            [id number]-HQ190L ([id number]) scope was introduced through                            the anus and advanced to the the cecum, identified                            by appendiceal orifice and ileocecal valve. The                            colonoscopy was performed without difficulty. The                            patient tolerated the procedure well. The quality                            of the bowel preparation was evaluated using the                            BBPS [facility] Bowel Preparation Scale) with scores                            of: Right Colon = 3, Transverse Colon = 3 and Left                            Colon = 3 (entire mucosa seen well with no residual                            staining, small fragments of stool or opaque                            liquid). The total BBPS score equals 9. The  ileocecal valve, appendiceal orifice, and rectum                            were photographed. Scope In: 8:41:24 AM Scope Out: 8:56:44 AM Scope Withdrawal Time: 0 hours 13 minutes 32 seconds  Total Procedure Duration: 0 hours 15 minutes 20 seconds  Findings:      There was evidence of a prior end-to-end colo-rectal anastomosis in the       recto-sigmoid colon. This was patent and was characterized by healthy       appearing mucosa. The anastomosis was traversed. Biopsies were taken       with a cold forceps for histology.      There is no endoscopic evidence of mass in the rectum.      An area of mildly erythematous mucosa was found in the rectum. Biopsies       were taken with a cold forceps for histology. Impression:               - Patent end-to-end colo-rectal anastomosis,                            characterized by healthy appearing mucosa. Biopsied.                           - Erythematous mucosa in the rectum. Biopsied. Moderate  Sedation:      Per Anesthesia Care Recommendation:           - Patient has a contact number available for                            emergencies. The signs and symptoms of potential                            delayed complications were discussed with the                            patient. Return to normal activities tomorrow.                            Written discharge instructions were provided to the                            patient.                           - High fiber diet.                           - Continue present medications.                           - Await pathology results.                           - Repeat colonoscopy in 3 years for surveillance.                           - Return to  primary care physician as previously                            scheduled. Procedure Code(s):        --- Professional ---                           [phone], Colonoscopy, flexible; with biopsy, single                            or multiple Diagnosis Code(s):        --- Professional ---                           Z85.048, Personal history of other malignant                            neoplasm of rectum, rectosigmoid junction, and anus                           Z98.0, Intestinal bypass and anastomosis status                           K62.89, Other specified diseases of anus and rectum CPT copyright 2022 [employer]. All rights reserved. The codes documented in this report are preliminary and upon coder review may  be revised to meet current compliance requirements. Sanjuan Dame, MD Sanjuan Dame, MD 11/11/2023 9:04:48 AM This report has been signed electronically. Number of Addenda: 0

## 2023-11-12 ENCOUNTER — Encounter (INDEPENDENT_AMBULATORY_CARE_PROVIDER_SITE_OTHER): Payer: Self-pay | Admitting: *Deleted

## 2023-11-12 ENCOUNTER — Encounter (HOSPITAL_COMMUNITY): Payer: Self-pay | Admitting: Gastroenterology

## 2023-11-12 LAB — SURGICAL PATHOLOGY

## 2023-11-15 ENCOUNTER — Encounter (INDEPENDENT_AMBULATORY_CARE_PROVIDER_SITE_OTHER): Payer: Self-pay | Admitting: *Deleted

## 2023-11-15 NOTE — Progress Notes (Signed)
 I reviewed the pathology results. Ann, can you send her a letter with the findings as described below please?  Repeat colonoscopy in 3 years  Thanks,  [name], MD Gastroenterology and Hepatology [facility]  ---------------------------------------------------------------------------------------------  [facility] [address], [address], [place], [place] [address] Phone:  [phone]   11/15/23 [place], [place]   Dear Troy Dillon,  I am writing to inform you that the biopsies taken during your recent endoscopic examination showed:  A.  RECTUM, BIOPSY:       Hyperplastic polyp.       Negative for dysplasia.    What does this mean?  I am writing to let you know the results of your recent colonoscopy.  You had hyperplastic changes to the rectum. These findings are NOT cancer and do not turn into cancer.  Given these findings, it is recommended that your next colonoscopy be performed in 3 years given history of rectal cancer . Also continue to follow up with oncology as scheduled   Also I value your feedback , so if you get a survey , please take the time to fill it out and thank you for choosing [facility]  Please call us at [phone] if you have persistent problems or have questions about your condition that have not been fully answered at this time.  Sincerely,  [name], MD Gastroenterology and Hepatology

## 2023-12-21 ENCOUNTER — Encounter: Payer: Self-pay | Admitting: Hematology

## 2024-01-05 ENCOUNTER — Encounter: Payer: Self-pay | Admitting: Hematology

## 2024-01-05 ENCOUNTER — Encounter (HOSPITAL_COMMUNITY): Payer: Self-pay

## 2024-01-10 ENCOUNTER — Inpatient Hospital Stay: Payer: No Typology Code available for payment source | Attending: Hematology

## 2024-01-10 ENCOUNTER — Ambulatory Visit (HOSPITAL_COMMUNITY)
Admission: RE | Admit: 2024-01-10 | Discharge: 2024-01-10 | Disposition: A | Discharge status: Home / Self Care | Payer: No Typology Code available for payment source | Source: Ambulatory Visit | Attending: Hematology | Admitting: Hematology

## 2024-01-10 DIAGNOSIS — C20 Malignant neoplasm of rectum: Secondary | ICD-10-CM

## 2024-01-10 DIAGNOSIS — Z87891 Personal history of nicotine dependence: Secondary | ICD-10-CM | POA: Insufficient documentation

## 2024-01-10 DIAGNOSIS — I700 Atherosclerosis of aorta: Secondary | ICD-10-CM | POA: Diagnosis not present

## 2024-01-10 DIAGNOSIS — Z79899 Other long term (current) drug therapy: Secondary | ICD-10-CM | POA: Insufficient documentation

## 2024-01-10 DIAGNOSIS — G629 Polyneuropathy, unspecified: Secondary | ICD-10-CM | POA: Insufficient documentation

## 2024-01-10 DIAGNOSIS — Z8049 Family history of malignant neoplasm of other genital organs: Secondary | ICD-10-CM | POA: Insufficient documentation

## 2024-01-10 MED ORDER — IOHEXOL 300 MG/ML  SOLN
100.0000 mL | Freq: Once | INTRAMUSCULAR | Status: AC | PRN
  Administered 2024-01-10: 100 mL via INTRAVENOUS

## 2024-01-12 ENCOUNTER — Inpatient Hospital Stay

## 2024-01-12 DIAGNOSIS — Z87891 Personal history of nicotine dependence: Secondary | ICD-10-CM | POA: Diagnosis not present

## 2024-01-12 DIAGNOSIS — Z8049 Family history of malignant neoplasm of other genital organs: Secondary | ICD-10-CM | POA: Diagnosis not present

## 2024-01-12 DIAGNOSIS — Z79899 Other long term (current) drug therapy: Secondary | ICD-10-CM | POA: Diagnosis not present

## 2024-01-12 DIAGNOSIS — C20 Malignant neoplasm of rectum: Secondary | ICD-10-CM | POA: Diagnosis not present

## 2024-01-12 DIAGNOSIS — G629 Polyneuropathy, unspecified: Secondary | ICD-10-CM | POA: Diagnosis not present

## 2024-01-12 LAB — COMPREHENSIVE METABOLIC PANEL WITH GFR
ALT: 18 U/L (ref 0–44)
AST: 21 U/L (ref 15–41)
Albumin: 3.7 g/dL (ref 3.5–5.0)
Alkaline Phosphatase: 121 U/L (ref 38–126)
Anion gap: 4 — ABNORMAL LOW (ref 5–15)
BUN: 18 mg/dL (ref 6–20)
CO2: 24 mmol/L (ref 22–32)
Calcium: 8.7 mg/dL — ABNORMAL LOW (ref 8.9–10.3)
Chloride: 106 mmol/L (ref 98–111)
Creatinine, Ser: 1.15 mg/dL (ref 0.61–1.24)
GFR, Estimated: >60 mL/min (ref >60)
Glucose, Bld: 92 mg/dL (ref 70–99)
Potassium: 3.8 mmol/L (ref 3.5–5.1)
Sodium: 134 mmol/L — ABNORMAL LOW (ref 135–145)
Total Bilirubin: 1 mg/dL (ref 0.0–1.2)
Total Protein: 6.9 g/dL (ref 6.5–8.1)

## 2024-01-12 LAB — CBC WITH DIFFERENTIAL/PLATELET
Abs Immature Granulocytes: 0.01 10*3/uL (ref 0.00–0.07)
Basophils Absolute: 0 10*3/uL (ref 0.0–0.1)
Basophils Relative: 1 %
Eosinophils Absolute: 0.1 10*3/uL (ref 0.0–0.5)
Eosinophils Relative: 4 %
HCT: 41.5 % (ref 39.0–52.0)
Hemoglobin: 14.9 g/dL (ref 13.0–17.0)
Immature Granulocytes: 0 %
Lymphocytes Relative: 24 %
Lymphs Abs: 1 10*3/uL (ref 0.7–4.0)
MCH: 32.2 pg (ref 26.0–34.0)
MCHC: 35.9 g/dL (ref 30.0–36.0)
MCV: 89.6 fL (ref 80.0–100.0)
Monocytes Absolute: 0.5 10*3/uL (ref 0.1–1.0)
Monocytes Relative: 12 %
Neutro Abs: 2.4 10*3/uL (ref 1.7–7.7)
Neutrophils Relative %: 59 %
Platelets: 167 10*3/uL (ref 150–400)
RBC: 4.63 MIL/uL (ref 4.22–5.81)
RDW: 12.1 % (ref 11.5–15.5)
WBC: 4 10*3/uL (ref 4.0–10.5)
nRBC: 0 % (ref 0.0–0.2)

## 2024-01-13 LAB — CEA: CEA: 1.8 ng/mL (ref 0.0–4.7)

## 2024-01-18 ENCOUNTER — Inpatient Hospital Stay: Payer: No Typology Code available for payment source | Admitting: Hematology

## 2024-01-18 VITALS — BP 123/79 | HR 69 | Temp 98.3°F | Resp 18 | Wt 191.8 lb

## 2024-01-18 DIAGNOSIS — Z79899 Other long term (current) drug therapy: Secondary | ICD-10-CM | POA: Diagnosis not present

## 2024-01-18 DIAGNOSIS — Z8049 Family history of malignant neoplasm of other genital organs: Secondary | ICD-10-CM | POA: Diagnosis not present

## 2024-01-18 DIAGNOSIS — C20 Malignant neoplasm of rectum: Secondary | ICD-10-CM

## 2024-01-18 DIAGNOSIS — G629 Polyneuropathy, unspecified: Secondary | ICD-10-CM | POA: Diagnosis not present

## 2024-01-18 DIAGNOSIS — Z87891 Personal history of nicotine dependence: Secondary | ICD-10-CM | POA: Diagnosis not present

## 2024-01-18 MED ORDER — HYDROCORTISONE (PERIANAL) 2.5 % EX CREA: 1.0000 | TOPICAL_CREAM | Freq: Two times a day (BID) | CUTANEOUS | 6 refills | Status: AC

## 2024-01-18 NOTE — Progress Notes (Signed)
 [facility] [address], [place] [address]    Clinic Day:  01/18/2024  Referring physician: [facility], [facility]  Patient Care Team: [facility] as PCP - General [name], MD as PCP - Cardiology (Cardiology) [name], MD as Medical Oncologist (Medical Oncology) [name], RN as Oncology Nurse Navigator (Medical Oncology)   ASSESSMENT & PLAN:   Assessment: 1.  Stage IIIB (T3cN1) rectal adenocarcinoma, MSI stable: - Presentation with rectal bleeding - Colonoscopy (06/08/2022): Fungating partially obstructing large mass found in the rectum 11 cm from anal verge.  Partially circumferential involving one half of the lumen.  Mass measured 3 cm in length.  Diameter 2 cm.  Oozing present.  1 semipedunculated polyp (40 cm from anus) and 3 sessile polyps in the cecum removed. - Pathology (06/08/2022): Rectal mass 11 cm from anal verge-adenocarcinoma.  MMR preserved.  MSI-stable. - CEA (06/08/2022): 2.9 - MRI pelvis (06/10/2022): T3CN1, tumor extends into sigmoid from the high rectum, extension through muscularis propria, approximately 6 mm beyond the rectal wall.  Tumor begins in the highly portion of the rectum and extends into the sigmoid colon.  Extramural vascular invasion/tumor thrombus along the right lateral margin.  Shortest distance of tumor from mesorectal fascia: 15 mm.  Single high mesorectal or superior rectal lymph node measuring 9 mm.  Distance from tumor to internal anal sphincter is approximately 7-11 cm. - CT CAP (06/10/2022): Circumferential wall thickening of the upper/mid rectum, mildly enlarged high perirectal lymph nodes measuring up to 6 mm in short axis.  No evidence of distant metastatic disease in the chest, abdomen or pelvis. - Neoadjuvant FOLFOX 8 cycles from 06/29/2022 through 10/05/2022 - MRI pelvis (10/13/2022): Evidence of decreased extramural vascular invasion along the right lateral margin.  Tumor  length 3.4 cm, previously 4.5 cm.  Significant interval decrease thickening of high rectum/low sigmoid colon.  Mesorectal lymph nodes 7 mm, previously 9 mm in 4 mm node.  I discussed with radiologist who felt that there is more than 20% clinical improvement. - I have recommended foregoing radiation based on Prospect trial data.  He met with Dr. Camilo Cella and Dr. Jeryl Moris who have recommended chemoradiation. - Chemoradiation with Xeloda  from 11/11/2022 through 12/18/2022. - Robotic assisted low anterior resection on 02/17/2023 by Dr. Camilo Cella - Pathology: [id number], 2/22 lymph nodes involved, margins negative, residual moderate differentiated adenocarcinoma discontinuously involving a fibrotic area of about 2.2 cm.  MMR preserved.   2.  Social/family history: - He lives at home with his wife Troy Dillon.  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  Denies any chemical exposure.  Does not smoke cigarettes but dips tobacco. - Father had colon cancer at age 5. - Paternal grandmother died of colon cancer. - Paternal aunt had colon cancer. - Mother had "male cancer" and underwent hysterectomy.    Plan: 1.  Stage IIIb (T3CN1) rectal adenocarcinoma, MSI-stable: - He is having loose bowel movements twice daily.  He reports irritation of the perianal area.  Will give Anusol HC to apply topically as needed. - Reviewed labs from 01/12/2024: LFTs are normal.  CBC is normal.  CEA is 1.8.  Signatera test from February is negative.  Signatera test from 01/12/2024 is pending. - CT CAP on 01/10/2024): 3 mm nodule of the right lung base is no longer present.  However there is a new 3 mm nodule of the dependent right lower lobe.  No evidence of lymphadenopathy or metastatic disease. - He recently  had colonoscopy which showed normal anastomosis. - We discussed surveillance plan with labs and CEA every 3 months until April 2026, imaging and Signatera test every 6 months until April 2026.  After that we will do labs and Signatera every  6 months and imaging once a year up to 5 years.   2.  Peripheral neuropathy: - Constant numbness in the right hand first 3 fingers is stable.  No intervention necessary.     Orders Placed This Encounter  Procedures   CBC with Differential    Standing Status:   Future    Expected Date:   04/17/2024    Expiration Date:   01/17/2025   Comprehensive metabolic panel    Standing Status:   Future    Expected Date:   04/17/2024    Expiration Date:   01/17/2025   CEA    Standing Status:   Future    Expected Date:   04/17/2024    Expiration Date:   01/17/2025      Hurman Maiden R Teague,acting as a scribe for [name], MD.,have documented all relevant documentation on the behalf of [name], MD,as directed by  [name], MD while in the presence of [name], MD.  I, [name] MD, have reviewed the above documentation for accuracy and completeness, and I agree with the above.     [name], MD   5/27/20253:43 PM  CHIEF COMPLAINT:   Diagnosis: stage III rectal cancer     Cancer Staging  Rectal carcinoma [facility]) Staging form: Colon and Rectum, AJCC 8th Edition - Clinical stage from 06/18/2022: Stage IIIB (cT3, cN1, cM0) - Unsigned    Prior Therapy: 1. FOLFOX 06/29/22 - 10/05/22 2. Xeloda , radiation 11/11/22 - 12/18/22  Current Therapy: Surgery on 01/28/2023   HISTORY OF PRESENT ILLNESS:   Oncology History  Rectal carcinoma (HCC)  06/17/2022 Initial Diagnosis   Rectal carcinoma (HCC)   06/29/2022 -  Chemotherapy   Patient is on Treatment Plan : COLORECTAL FOLFOX q14d x 4 months      Genetic Testing   [facility] Common Cancer Panel+RNA was Negative. Report date is 08/29/2022.  The Common Hereditary Cancers Panel offered by [facility] includes sequencing and/or deletion duplication testing of the following 48 genes: APC, ATM, AXIN2, BAP1, BARD1, BMPR1A, BRCA1, BRCA2, BRIP1, CDH1, CDK4, CDKN2A (p14ARF and p16INK4a only), CHEK2, CTNNA1,  DICER1, EPCAM (Deletion/duplication testing only), FH, GREM1 (promoter region duplication testing only), HOXB13, KIT, MBD4, MEN1, MLH1, MSH2, MSH3, MSH6, MUTYH, NF1, NHTL1, PALB2, PDGFRA, PMS2, POLD1, POLE, PTEN, RAD51C, RAD51D, SDHA (sequencing analysis only except exon 14), SDHB, SDHC, SDHD, SMAD4, SMARCA4. STK11, TP53, TSC1, TSC2, and VHL.      INTERVAL HISTORY:   Troy [facility] is a 53 y.o. male presenting to clinic today for follow up of stage III rectal cancer. He was last seen by me on 10/12/23.  Since his last visit, he had CT CAP on 01/10/24 that found: Status post rectosigmoid colon resection and reanastomosis. Unchanged postoperative/post treatment appearance of the pelvis with perirectal and presacral fat stranding. A previously seen 0.3 cm nodule near the right lung base is no longer present, however there is a new 0.3 cm nodule of the dependent right lower lobe. These transient nodules are almost certainly infectious or inflammatory. 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 No evidence of lymphadenopathy or metastatic disease in the abdomen or pelvis. Coronary artery disease.  Troy [facility] underwent a colonoscopy on 11/11/23 with Dr. Alita Irwin.  Today, he states that he is doing well overall. His appetite level is at 100%. His energy level is at 100%.  He is accompanied by his wife. Troy Dillon takes an imodium  once daily. He has soft stools and typically has BM's twice daily. Numbness in the first 3 fingers of the right hand is table and he denies any pain to the area. He denies any recent infections and notes a normal appetite.   Troy Dillon notes soreness and irritation on the anus after excessive diarrhea. He has tried multiple OTC medication and recently dermaplast. Dermaplast has slightly improved symptoms, though he would like a prescription cream or an alternative treatment.   PAST MEDICAL HISTORY:   Past Medical  History: Past Medical History:  Diagnosis Date   Anemia    Anxiety    Depression    Neuromuscular disorder (HCC)    Neuropathy d/t chemo   Rectal cancer (HCC) 06/08/2022    Surgical History: Past Surgical History:  Procedure Laterality Date   COLONOSCOPY  06/08/2022   COLONOSCOPY WITH PROPOFOL  N/A 11/11/2023   Procedure: COLONOSCOPY WITH PROPOFOL ;  Surgeon: Hargis Lias, MD;  [facility];  Service: Endoscopy;  Laterality: N/A;  9:00AM;ASA 1-2   EYE SURGERY     FLEXIBLE SIGMOIDOSCOPY N/A 02/17/2023   Procedure: FLEXIBLE SIGMOIDOSCOPY;  Surgeon: Melvenia Stabs, MD;  Location: [facility];  Service: General;  Laterality: N/A;   FLEXIBLE SIGMOIDOSCOPY N/A 03/22/2023   Procedure: FLEXIBLE SIGMOIDOSCOPY;  Surgeon: Melvenia Stabs, MD;  Location: [facility];  Service: General;  Laterality: N/A;   FRACTURE SURGERY     Right Femur- Cables in bone remain in place, rod removed in 2000   ILEOSTOMY CLOSURE N/A 05/14/2023   Procedure: LOOP ILEOSTOMY TAKEDOWN;  Surgeon: Melvenia Stabs, MD;  Location: [facility];  Service: General;  Laterality: N/A;   IR IMAGING GUIDED PORT INSERTION  06/22/2022   ORTHOPEDIC SURGERY     PORT-A-CATH REMOVAL N/A 05/14/2023   Procedure: REMOVAL PORT-A-CATH;  Surgeon: Melvenia Stabs, MD;  Location: [facility];  Service: General;  Laterality: N/A;   XI ROBOTIC ASSISTED LOWER ANTERIOR RESECTION Bilateral 02/17/2023   Procedure: XI ROBOTIC ASSISTED LOWER ANTERIOR RESECTION, DIVERTING LOOP ILEOSTOMY, BILATERAL TAP BLOCK, TISSUE PERFUSION ASSESSMENT VIA FIREFLY INJECTION;  Surgeon: Melvenia Stabs, MD;  Location: [facility];  Service: General;  Laterality: Bilateral;    Social History: Social History   Socioeconomic History   Marital status: Married    Spouse name: Not on file   Number of children: Not on file   Years of education: Not on file   Highest education level: Not on file  Occupational History   Not on file  Tobacco Use    Smoking status: Former    Current packs/day: 0.00    Average packs/day: 1 pack/day for 15.0 years (15.0 ttl pk-yrs)    Types: Cigarettes    Start date: 57    Quit date: 2003    Years since quitting: 22.4    Passive exposure: Past   Smokeless tobacco: Former    Types: Snuff   Tobacco comments:    Some Dipping  Vaping Use   Vaping status: Never Used  Substance and Sexual Activity   Alcohol use: Yes    Comment: occasionally   Drug use: Never   Sexual activity: Not on file  Other Topics Concern   Not on file  Social History Narrative   Not on file   Social Drivers of Health  Financial Resource Strain: Not on file  Food Insecurity: No Food Insecurity (05/16/2023)   Hunger Vital Sign    Worried About Running Out of Food in the Last Year: Never true    Ran Out of Food in the Last Year: Never true  Transportation Needs: No Transportation Needs (05/16/2023)   PRAPARE - Administrator, Civil Service (Medical): No    Lack of Transportation (Non-Medical): No  Physical Activity: Not on file  Stress: Not on file  Social Connections: Not on file  Intimate Partner Violence: Not At Risk (05/16/2023)   Humiliation, Afraid, Rape, and Kick questionnaire    Fear of Current or Ex-Partner: No    Emotionally Abused: No    Physically Abused: No    Sexually Abused: No    Family History: Family History  Problem Relation Age of Onset   Cancer Mother 74 - 6       GYN malignancy details unknown, TAH/BSO   Colon cancer Father 60   Heart attack Father    Colon cancer Paternal Aunt 59   Colon cancer Paternal Grandmother 51    Current Medications:  Current Outpatient Medications:    escitalopram  (LEXAPRO ) 10 MG tablet, Take 1 tablet (10 mg total) by mouth daily., Disp: 90 tablet, Rfl: 3   hydrocortisone (ANUSOL-HC) 2.5 % rectal cream, Place 1 Application rectally 2 (two) times daily., Disp: 30 g, Rfl: 6   loperamide  (IMODIUM ) 2 MG capsule, Take 1 capsule (2 mg total) by mouth  as needed for diarrhea or loose stools., Disp: 60 capsule, Rfl: 3   Multiple Vitamin (MULTIVITAMIN) tablet, Take 1 tablet by mouth daily., Disp: , Rfl:    PRESCRIPTION MEDICATION, Simbrinza - Prescription eye drop for glaucoma., Disp: , Rfl:    psyllium (METAMUCIL) 58.6 % packet, Take 1 packet by mouth 2 (two) times daily., Disp: 60 packet, Rfl: 2   Allergies: Allergies  Allergen Reactions   Hydrocodone Itching   Latex     Eye irritation     REVIEW OF SYSTEMS:   Review of Systems  Constitutional:  Negative for chills, fatigue and fever.  HENT:   Negative for lump/mass, mouth sores, nosebleeds, sore throat and trouble swallowing.   Eyes:  Negative for eye problems.  Respiratory:  Negative for cough and shortness of breath.   Cardiovascular:  Negative for chest pain, leg swelling and palpitations.  Gastrointestinal:  Negative for abdominal pain, constipation, diarrhea, nausea and vomiting.  Genitourinary:  Negative for bladder incontinence, difficulty urinating, dysuria, frequency, hematuria and nocturia.   Musculoskeletal:  Negative for arthralgias, back pain, flank pain, myalgias and neck pain.  Skin:  Negative for itching and rash.  Neurological:  Negative for dizziness, headaches and numbness.  Hematological:  Does not bruise/bleed easily.  Psychiatric/Behavioral:  Negative for depression, sleep disturbance and suicidal ideas. The patient is nervous/anxious.   All other systems reviewed and are negative.    VITALS:   Blood pressure 123/79, pulse 69, temperature 98.3 F (36.8 C), resp. rate 18, weight 191 lb 12.8 oz (87 kg), SpO2 98%.  Wt Readings from Last 3 Encounters:  01/18/24 191 lb 12.8 oz (87 kg)  11/11/23 189 lb 13.1 oz (86.1 kg)  10/20/23 189 lb 12.8 oz (86.1 kg)    Body mass index is 28.32 kg/m.  Performance status (ECOG): 0 - Asymptomatic  PHYSICAL EXAM:   Physical Exam Vitals and nursing note reviewed. Exam conducted with a chaperone present.   Constitutional:      Appearance:  Normal appearance.  Cardiovascular:     Rate and Rhythm: Normal rate and regular rhythm.     Pulses: Normal pulses.     Heart sounds: Normal heart sounds.  Pulmonary:     Effort: Pulmonary effort is normal.     Breath sounds: Normal breath sounds.  Abdominal:     Palpations: Abdomen is soft. There is no hepatomegaly, splenomegaly or mass.     Tenderness: There is no abdominal tenderness.  Musculoskeletal:     Right lower leg: No edema.     Left lower leg: No edema.  Lymphadenopathy:     Cervical: No cervical adenopathy.     Right cervical: No superficial, deep or posterior cervical adenopathy.    Left cervical: No superficial, deep or posterior cervical adenopathy.     Upper Body:     Right upper body: No supraclavicular or axillary adenopathy.     Left upper body: No supraclavicular or axillary adenopathy.  Neurological:     General: No focal deficit present.     Mental Status: He is alert and oriented to person, place, and time.  Psychiatric:        Mood and Affect: Mood normal.        Behavior: Behavior normal.     LABS:      Latest Ref Rng & Units 01/12/2024    3:07 PM 10/05/2023    3:03 PM 06/22/2023    1:53 PM  CBC  WBC 4.0 - 10.5 K/uL 4.0  3.6  3.4   Hemoglobin 13.0 - 17.0 g/dL 78.2  95.6  21.3   Hematocrit 39.0 - 52.0 % 41.5  41.9  42.5   Platelets 150 - 400 K/uL 167  164  158       Latest Ref Rng & Units 01/12/2024    3:07 PM 10/05/2023    3:03 PM 06/22/2023    1:53 PM  CMP  Glucose 70 - 99 mg/dL 92  086  578   BUN 6 - 20 mg/dL 18  16  16    Creatinine 0.61 - 1.24 mg/dL 4.69  6.29  5.28   Sodium 135 - 145 mmol/L 134  134  137   Potassium 3.5 - 5.1 mmol/L 3.8  4.2  3.8   Chloride 98 - 111 mmol/L 106  103  101   CO2 22 - 32 mmol/L 24  24  28    Calcium  8.9 - 10.3 mg/dL 8.7  8.8  8.6   Total Protein 6.5 - 8.1 g/dL 6.9  7.4  7.4   Total Bilirubin 0.0 - 1.2 mg/dL 1.0  1.0  0.7   Alkaline Phos 38 - 126 U/L 121  100  113    AST 15 - 41 U/L 21  19  20    ALT 0 - 44 U/L 18  15  16       Lab Results  Component Value Date   CEA1 1.8 01/12/2024   /  CEA  Date Value Ref Range Status  01/12/2024 1.8 0.0 - 4.7 ng/mL Final    Comment:    (NOTE)                             Nonsmokers          <3.9                             Smokers             <  5.6 [employer] Electrochemiluminescence Immunoassay (ECLIA) Values obtained with different assay methods or kits cannot be used interchangeably.  Results cannot be interpreted as absolute evidence of the presence or absence of malignant disease. Performed At: [facility] [address] [place], [place] [id number] Pearlean Botts MD ZH:[id number]    No results found for: "PSA1" No results found for: "[id number]" No results found for: "CAN125"  No results found for: "TOTALPROTELP", "ALBUMINELP", "A1GS", "A2GS", "BETS", "BETA2SER", "GAMS", "MSPIKE", "SPEI" Lab Results  Component Value Date   TIBC 407 10/12/2023   FERRITIN 59 10/12/2023   IRONPCTSAT 18 10/12/2023   No results found for: "LDH"   STUDIES:   CT CHEST ABDOMEN PELVIS W CONTRAST Result Date: 01/10/2024 CLINICAL DATA:  Rectal cancer restaging status post resection * Tracking Code: BO * EXAM: CT CHEST, ABDOMEN, AND PELVIS WITH CONTRAST TECHNIQUE: Multidetector CT imaging of the chest, abdomen and pelvis was performed following the standard protocol during bolus administration of intravenous contrast. RADIATION DOSE REDUCTION: This exam was performed according to the departmental dose-optimization program which includes automated exposure control, adjustment of the mA and/or kV according to patient size and/or use of iterative reconstruction technique. CONTRAST:  OMNIPAQUE  IOHEXOL  300 MG/ML  SOLN COMPARISON:  06/22/2023 FINDINGS: CT CHEST FINDINGS Cardiovascular: No significant extracardiac vascular findings. Normal heart size. Left coronary artery calcifications. No pericardial effusion.  Mediastinum/Nodes: No enlarged mediastinal, hilar, or axillary lymph nodes. Thyroid  gland, trachea, and esophagus demonstrate no significant findings. Lungs/Pleura: Diffuse bilateral bronchial wall thickening and background of fine centrilobular pulmonary nodularity throughout the lungs. A previously seen 0.3 cm nodule near the right lung base is no longer present, however there is a new 0.3 cm nodule of the dependent right lower lobe (series 3, image 102). No pleural effusion or pneumothorax. Musculoskeletal: No chest wall abnormality. No acute osseous findings. CT ABDOMEN PELVIS FINDINGS Hepatobiliary: No solid liver abnormality is seen. No gallstones, gallbladder wall thickening, or biliary dilatation. Pancreas: Unremarkable. No pancreatic ductal dilatation or surrounding inflammatory changes. Spleen: Normal in size without significant abnormality. Adrenals/Urinary Tract: Adrenal glands are unremarkable. Kidneys are normal, without renal calculi, solid lesion, or hydronephrosis. Bladder is unremarkable. Stomach/Bowel: Stomach is within normal limits. Appendix appears normal. No evidence of bowel wall thickening, distention, or inflammatory changes. Status post rectosigmoid colon resection and reanastomosis. Unchanged postoperative/post treatment appearance of the pelvis perirectal and presacral fat stranding (series 2, image 110). Vascular/Lymphatic: Scattered aortic atherosclerosis. No enlarged abdominal or pelvic lymph nodes. Reproductive: No mass or other abnormality. Other: No abdominal wall hernia or abnormality. No ascites. Musculoskeletal: No acute osseous findings. IMPRESSION: 1. Status post rectosigmoid colon resection and reanastomosis. Unchanged postoperative/post treatment appearance of the pelvis with perirectal and presacral fat stranding. 2. A previously seen 0.3 cm nodule near the right lung base is no longer present, however there is a new 0.3 cm nodule of the dependent right lower lobe. These  transient nodules are almost certainly infectious or inflammatory. Continued attention on follow-up. 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 3. No evidence of lymphadenopathy or metastatic disease in the abdomen or pelvis. 4. Coronary artery disease. Aortic Atherosclerosis (ICD10-I70.0). Electronically Signed   By: Fredricka Jenny M.D.   On: 01/10/2024 21:21

## 2024-01-18 NOTE — Patient Instructions (Signed)
 [facility] at [facility] Discharge Instructions   You were seen and examined today by Dr. Cheree Cords.  He reviewed the results of your lab work which are normal/stable. Your Signatera results are pending at this time.   He reviewed the results of your CT scan which did not show any evidence of cancer.   We will see you back in 3 months. We will repeat lab work prior to this visit.   Return as scheduled.    Thank you for choosing [facility] at [facility] to provide your oncology and hematology care.  To afford each patient quality time with our provider, please arrive at least 15 minutes before your scheduled appointment time.   If you have a lab appointment with the Cancer Center please come in thru the Main Entrance and check in at the main information desk.  You need to re-schedule your appointment should you arrive 10 or more minutes late.  We strive to give you quality time with our providers, and arriving late affects you and other patients whose appointments are after yours.  Also, if you no show three or more times for appointments you may be dismissed from the clinic at the providers discretion.     Again, thank you for choosing [facility].  Our hope is that these requests will decrease the amount of time that you wait before being seen by our physicians.       _____________________________________________________________  Should you have questions after your visit to [facility], please contact our office at [phone] and follow the prompts.  Our office hours are 8:00 a.m. and 4:30 p.m. Monday - Friday.  Please note that voicemails left after 4:00 p.m. may not be returned until the following business day.  We are closed weekends and major holidays.  You do have access to a nurse 24-7, just call the main number to the clinic [phone] and do not press any options, hold on the line and a nurse will answer  the phone.    For prescription refill requests, have your pharmacy contact our office and allow 72 hours.    Due to Covid, you will need to wear a mask upon entering the hospital. If you do not have a mask, a mask will be given to you at the Main Entrance upon arrival. For doctor visits, patients may have 1 support person age 60 or older with them. For treatment visits, patients can not have anyone with them due to social distancing guidelines and our immunocompromised population.

## 2024-01-19 ENCOUNTER — Other Ambulatory Visit: Payer: Self-pay

## 2024-02-22 ENCOUNTER — Other Ambulatory Visit: Payer: Self-pay | Admitting: Hematology

## 2024-03-28 ENCOUNTER — Other Ambulatory Visit: Payer: Self-pay | Admitting: Hematology

## 2024-04-06 ENCOUNTER — Other Ambulatory Visit: Payer: Self-pay

## 2024-04-11 DIAGNOSIS — H35032 Hypertensive retinopathy, left eye: Secondary | ICD-10-CM | POA: Diagnosis not present

## 2024-04-27 ENCOUNTER — Inpatient Hospital Stay: Attending: Hematology

## 2024-04-27 DIAGNOSIS — G629 Polyneuropathy, unspecified: Secondary | ICD-10-CM | POA: Insufficient documentation

## 2024-04-27 DIAGNOSIS — C20 Malignant neoplasm of rectum: Secondary | ICD-10-CM | POA: Diagnosis not present

## 2024-04-27 DIAGNOSIS — N521 Erectile dysfunction due to diseases classified elsewhere: Secondary | ICD-10-CM | POA: Diagnosis not present

## 2024-04-27 DIAGNOSIS — Z800 Family history of malignant neoplasm of digestive organs: Secondary | ICD-10-CM | POA: Diagnosis not present

## 2024-04-27 LAB — CBC WITH DIFFERENTIAL/PLATELET
Abs Immature Granulocytes: 0 K/uL (ref 0.00–0.07)
Basophils Absolute: 0 K/uL (ref 0.0–0.1)
Basophils Relative: 1 %
Eosinophils Absolute: 0.2 K/uL (ref 0.0–0.5)
Eosinophils Relative: 5 %
HCT: 44.1 % (ref 39.0–52.0)
Hemoglobin: 15.9 g/dL (ref 13.0–17.0)
Immature Granulocytes: 0 %
Lymphocytes Relative: 24 %
Lymphs Abs: 0.9 K/uL (ref 0.7–4.0)
MCH: 32.4 pg (ref 26.0–34.0)
MCHC: 36.1 g/dL — ABNORMAL HIGH (ref 30.0–36.0)
MCV: 90 fL (ref 80.0–100.0)
Monocytes Absolute: 0.4 K/uL (ref 0.1–1.0)
Monocytes Relative: 11 %
Neutro Abs: 2.3 K/uL (ref 1.7–7.7)
Neutrophils Relative %: 59 %
Platelets: 157 K/uL (ref 150–400)
RBC: 4.9 MIL/uL (ref 4.22–5.81)
RDW: 11.8 % (ref 11.5–15.5)
WBC: 3.9 K/uL — ABNORMAL LOW (ref 4.0–10.5)
nRBC: 0 % (ref 0.0–0.2)

## 2024-04-27 LAB — COMPREHENSIVE METABOLIC PANEL WITH GFR
ALT: 16 U/L (ref 0–44)
AST: 18 U/L (ref 15–41)
Albumin: 4 g/dL (ref 3.5–5.0)
Alkaline Phosphatase: 116 U/L (ref 38–126)
Anion gap: 11 (ref 5–15)
BUN: 14 mg/dL (ref 6–20)
CO2: 24 mmol/L (ref 22–32)
Calcium: 8.9 mg/dL (ref 8.9–10.3)
Chloride: 103 mmol/L (ref 98–111)
Creatinine, Ser: 1.05 mg/dL (ref 0.61–1.24)
GFR, Estimated: >60 mL/min (ref >60)
Glucose, Bld: 92 mg/dL (ref 70–99)
Potassium: 3.9 mmol/L (ref 3.5–5.1)
Sodium: 138 mmol/L (ref 135–145)
Total Bilirubin: 1.2 mg/dL (ref 0.0–1.2)
Total Protein: 7.4 g/dL (ref 6.5–8.1)

## 2024-04-28 LAB — CEA: CEA: 1.8 ng/mL (ref 0.0–4.7)

## 2024-05-04 ENCOUNTER — Inpatient Hospital Stay (HOSPITAL_BASED_OUTPATIENT_CLINIC_OR_DEPARTMENT_OTHER): Admitting: Oncology

## 2024-05-04 VITALS — BP 134/97 | HR 78 | Temp 97.7°F | Resp 16 | Wt 183.2 lb

## 2024-05-04 DIAGNOSIS — N529 Male erectile dysfunction, unspecified: Secondary | ICD-10-CM | POA: Insufficient documentation

## 2024-05-04 DIAGNOSIS — Z800 Family history of malignant neoplasm of digestive organs: Secondary | ICD-10-CM | POA: Diagnosis not present

## 2024-05-04 DIAGNOSIS — G629 Polyneuropathy, unspecified: Secondary | ICD-10-CM | POA: Insufficient documentation

## 2024-05-04 DIAGNOSIS — N521 Erectile dysfunction due to diseases classified elsewhere: Secondary | ICD-10-CM

## 2024-05-04 DIAGNOSIS — C20 Malignant neoplasm of rectum: Secondary | ICD-10-CM | POA: Diagnosis not present

## 2024-05-04 MED ORDER — SILDENAFIL CITRATE 25 MG PO TABS: 25.0000 mg | ORAL_TABLET | Freq: Every day | ORAL | 0 refills | Status: DC | PRN

## 2024-05-04 NOTE — Assessment & Plan Note (Addendum)
-   He is having loose bowel movements twice daily.  He reports irritation of the perianal area.  Continue Anusol  as needed. - Reviewed labs from 04/27/2024 which showed normal LFTs.  CEA 1.8.  Signatera testing from February 2025 was negative.  Repeat this at next visit. -Most recent CT CAP was on 01/10/2024 which showed 3 mm nodule of right lung base still longer present however there is a new 3 mm nodule on the dependent right lower lobe.  No evidence of lymphadenopathy or metastatic disease. -Colonoscopy from 11/11/2023 showed normal anastomosis.  Repeat in 3 years per GI. -Surveillance plan is lab work and CEA every 3 months until April 2026 and imaging and Signatera test every 6 months until April 2026.  After that, we will do Signatera every 6 months and imaging once a year up to 5 years.

## 2024-05-04 NOTE — Patient Instructions (Signed)
 Hey Troy Dillon, I have attached some information regarding Viagra  and side effects to look out for.  I spoke with Dr. Davonna who recommends starting a 25 mg and can increase if you feel like it is not helping.  Please please keep me updated on this.  Will continue to monitor your middle finger joint pain and to see if this starts to improve over the next 3 months.  As far as the labs that you are referring to Dr. Davonna feels like there is no part in doing this as long as you are comfortable.  It may be a research study within [facility] health that were not familiar with letters you are certainly welcome to participate.  Please let me know if you have any questions.  Delon Hope, NP 05/04/2024 5:15 PM

## 2024-05-04 NOTE — Assessment & Plan Note (Addendum)
-   Constant numbness in the right hand first 3 fingers is stable.  Reports new onset middle finger joint pain when gripping.  We discussed watching and waiting as neuropathy and numbness has resolved.  No intervention necessary.

## 2024-05-04 NOTE — Assessment & Plan Note (Signed)
 Etiology felt to be secondary to chemoradiation. We discussed sildenafil  25 mg as needed for ED. Discussed side effects of the medication and how and when to take.  Please let me know if you develop any side effects that are concerning.

## 2024-05-04 NOTE — Progress Notes (Signed)
 [facility] OFFICE PROGRESS NOTE  Practice, [facility]  ASSESSMENT & PLAN:    Assessment & Plan Rectal carcinoma [facility]) - He is having loose bowel movements twice daily.  He reports irritation of the perianal area.  Continue Anusol  as needed. - Reviewed labs from 04/27/2024 which showed normal LFTs.  CEA 1.8.  Signatera testing from February 2025 was negative.  Repeat this at next visit. -Most recent CT CAP was on 01/10/2024 which showed 3 mm nodule of right lung base still longer present however there is a new 3 mm nodule on the dependent right lower lobe.  No evidence of lymphadenopathy or metastatic disease. -Colonoscopy from 11/11/2023 showed normal anastomosis.  Repeat in 3 years per GI. -Surveillance plan is lab work and CEA every 3 months until April 2026 and imaging and Signatera test every 6 months until April 2026.  After that, we will do Signatera every 6 months and imaging once a year up to 5 years.  Neuropathy - Constant numbness in the right hand first 3 fingers is stable.  Reports new onset middle finger joint pain when gripping.  We discussed watching and waiting as neuropathy and numbness has resolved.  No intervention necessary.   Erectile dysfunction due to diseases classified elsewhere Etiology felt to be secondary to chemoradiation. We discussed sildenafil  25 mg as needed for ED. Discussed side effects of the medication and how and when to take.  Please let me know if you develop any side effects that are concerning.  Orders Placed This Encounter  Procedures   CT CHEST ABDOMEN PELVIS W CONTRAST    Standing Status:   Future    Expected Date:   07/10/2024    Expiration Date:   05/04/2025    If indicated for the ordered procedure, I authorize the administration of contrast media per Radiology protocol:   Yes    Does the patient have a contrast media/X-ray dye allergy?:   No    Preferred imaging location?:   [facility]    If indicated for the  ordered procedure, I authorize the administration of oral contrast media per Radiology protocol:   Yes   Signatera    Select as applicable. If patient is on or planning to receive immunotherapy, select drug: Not on Immunotherapy If Other or Multiple, Write down drug name:  Do not Delete Below This Line   ==========Department Information========== ID: [id number] Department:[facility] [address] [place] [address] Dept: [phone] Dept Fax: [phone]    Standing Status:   Standing    Number of Occurrences:   10    Expiration Date:   05/04/2025    Jennell to follow up with patient for sample collection (mobile phleb, lab, or saliva)::   No    Surveillance Program draw frequency::   Every 3 Months    Surveillance Program draw count::   4    Cancer type::   Rectal    Stage of diagnosis::   III    History of recurrence?:   No    Current disease status::   No evidence of disease    Date of surgery (MM/DD/YYYY)::   02/17/2023    Is the patient receiving or planning to receive immunotherapy?:   No    Patient status::   Outpatient    Most recent progress/clinical note attached?:   Yes    Pathology report attached?:   Yes    Pathology institution  name::   [facility]    Pathology institution address and fax number::   [phone]    Tissue collection date (MM/DD/YYYY)::   02/17/2023    By placing this electronic order I confirm the testing ordered herein is medically necessary and this patient has been informed of the details of the genetic test(s) ordered, including the risks, benefits, and alternatives, and has consented to testing.:   No    What type of billing?:   Bill Insurance    Release to patient:   Immediate [1]    INTERVAL HISTORY: Patient returns for follow-up for rectal carcinoma.  He denies any interval hospitalizations, surgeries or changes to his baseline health.  Patient had a  colonoscopy on 11/11/2023 which showed hyperplastic changes to the rectum and recommended a colonoscopy in 3 years.  Reports most of the neuropathy has resolved but he has noticed middle finger joint pain when gripping or lifting items.  Reports it does not happen every time but when it does it feels like his joints are fracturing.  It takes him several minutes for the symptoms to resolve.  He has not tried anything for this.   Reports erectile dysfunction symptoms.  States following chemotherapy and radiation he had some problem but now it has worsened.  We reviewed CBC, CMP, CEA.  SUMMARY OF HEMATOLOGIC HISTORY: Oncology History  Rectal carcinoma (HCC)  06/17/2022 Initial Diagnosis   Rectal carcinoma (HCC)   06/29/2022 - 10/07/2022 Chemotherapy   Patient is on Treatment Plan : COLORECTAL FOLFOX q14d x 4 months      Genetic Testing   [facility] Common Cancer Panel+RNA was Negative. Report date is 08/29/2022.  The Common Hereditary Cancers Panel offered by [facility] includes sequencing and/or deletion duplication testing of the following 48 genes: APC, ATM, AXIN2, BAP1, BARD1, BMPR1A, BRCA1, BRCA2, BRIP1, CDH1, CDK4, CDKN2A (p14ARF and p16INK4a only), CHEK2, CTNNA1, DICER1, EPCAM (Deletion/duplication testing only), FH, GREM1 (promoter region duplication testing only), HOXB13, KIT, MBD4, MEN1, MLH1, MSH2, MSH3, MSH6, MUTYH, NF1, NHTL1, PALB2, PDGFRA, PMS2, POLD1, POLE, PTEN, RAD51C, RAD51D, SDHA (sequencing analysis only except exon 14), SDHB, SDHC, SDHD, SMAD4, SMARCA4. STK11, TP53, TSC1, TSC2, and VHL.    1.  Stage IIIB (T3cN1) rectal adenocarcinoma, MSI stable: - Presentation with rectal bleeding - Colonoscopy (06/08/2022): Fungating partially obstructing large mass found in the rectum 11 cm from anal verge.  Partially circumferential involving one half of the lumen.  Mass measured 3 cm in length.  Diameter 2 cm.  Oozing present.  1 semipedunculated polyp (40 cm from anus) and 3 sessile polyps in  the cecum removed. - Pathology (06/08/2022): Rectal mass 11 cm from anal verge-adenocarcinoma.  MMR preserved.  MSI-stable. - CEA (06/08/2022): 2.9 - MRI pelvis (06/10/2022): T3CN1, tumor extends into sigmoid from the high rectum, extension through muscularis propria, approximately 6 mm beyond the rectal wall.  Tumor begins in the highly portion of the rectum and extends into the sigmoid colon.  Extramural vascular invasion/tumor thrombus along the right lateral margin.  Shortest distance of tumor from mesorectal fascia: 15 mm.  Single high mesorectal or superior rectal lymph node measuring 9 mm.  Distance from tumor to internal anal sphincter is approximately 7-11 cm. - CT CAP (06/10/2022): Circumferential wall thickening of the upper/mid rectum, mildly enlarged high perirectal lymph nodes measuring up to 6 mm in short axis.  No evidence of distant metastatic disease in the chest, abdomen or pelvis. - Neoadjuvant FOLFOX 8 cycles from 06/29/2022 through 10/05/2022 -  MRI pelvis (10/13/2022): Evidence of decreased extramural vascular invasion along the right lateral margin.  Tumor length 3.4 cm, previously 4.5 cm.  Significant interval decrease thickening of high rectum/low sigmoid colon.  Mesorectal lymph nodes 7 mm, previously 9 mm in 4 mm node.  I discussed with radiologist who felt that there is more than 20% clinical improvement. - I have recommended foregoing radiation based on Prospect trial data.  He met with Dr. Teresa and Dr. Dewey who have recommended chemoradiation. - Chemoradiation with Xeloda  from 11/11/2022 through 12/18/2022. - Robotic assisted low anterior resection on 02/17/2023 by Dr. Teresa - Pathology: [id number], 2/22 lymph nodes involved, margins negative, residual moderate differentiated adenocarcinoma discontinuously involving a fibrotic area of about 2.2 cm.  MMR preserved.   2.  Social/family history: - He lives at home with his wife Olam.  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   Denies any chemical exposure.  Does not smoke cigarettes but dips tobacco. - Father had colon cancer at age 1. - Paternal grandmother died of colon cancer. - Paternal aunt had colon cancer. - Mother had male cancer and underwent hysterectomy.  CBC    Component Value Date/Time   WBC 3.9 (L) 04/27/2024 1414   RBC 4.90 04/27/2024 1414   HGB 15.9 04/27/2024 1414   HCT 44.1 04/27/2024 1414   PLT 157 04/27/2024 1414   MCV 90.0 04/27/2024 1414   MCH 32.4 04/27/2024 1414   MCHC 36.1 (H) 04/27/2024 1414   RDW 11.8 04/27/2024 1414   LYMPHSABS 0.9 04/27/2024 1414   MONOABS 0.4 04/27/2024 1414   EOSABS 0.2 04/27/2024 1414   BASOSABS 0.0 04/27/2024 1414       Latest Ref Rng & Units 04/27/2024    2:14 PM 01/12/2024    3:07 PM 10/05/2023    3:03 PM  CMP  Glucose 70 - 99 mg/dL 92  92  889   BUN 6 - 20 mg/dL 14  18  16    Creatinine 0.61 - 1.24 mg/dL 8.94  8.84  8.96   Sodium 135 - 145 mmol/L 138  134  134   Potassium 3.5 - 5.1 mmol/L 3.9  3.8  4.2   Chloride 98 - 111 mmol/L 103  106  103   CO2 22 - 32 mmol/L 24  24  24    Calcium  8.9 - 10.3 mg/dL 8.9  8.7  8.8   Total Protein 6.5 - 8.1 g/dL 7.4  6.9  7.4   Total Bilirubin 0.0 - 1.2 mg/dL 1.2  1.0  1.0   Alkaline Phos 38 - 126 U/L 116  121  100   AST 15 - 41 U/L 18  21  19    ALT 0 - 44 U/L 16  18  15       Lab Results  Component Value Date   FERRITIN 59 10/12/2023    Vitals:   05/04/24 1445  BP: (!) 134/97  Pulse: 78  Resp: 16  Temp: 97.7 F (36.5 C)  SpO2: 100%    Review of System:  Review of Systems  Constitutional:  Negative for malaise/fatigue.  Gastrointestinal:  Negative for abdominal pain, blood in stool, constipation, diarrhea and melena.  Genitourinary:        ED  Musculoskeletal:  Positive for joint pain.    Physical Exam: Physical Exam Constitutional:      Appearance: Normal appearance.  HENT:     Head: Normocephalic and atraumatic.  Eyes:     Pupils: Pupils are equal, round,  and reactive to light.   Cardiovascular:     Rate and Rhythm: Normal rate and regular rhythm.     Heart sounds: Normal heart sounds. No murmur heard. Pulmonary:     Effort: Pulmonary effort is normal.     Breath sounds: Normal breath sounds. No wheezing.  Abdominal:     General: Bowel sounds are normal. There is no distension.     Palpations: Abdomen is soft.     Tenderness: There is no abdominal tenderness.  Musculoskeletal:        General: Normal range of motion.     Cervical back: Normal range of motion.  Skin:    General: Skin is warm and dry.     Findings: No rash.  Neurological:     Mental Status: He is alert and oriented to person, place, and time.     Gait: Gait is intact.  Psychiatric:        Mood and Affect: Mood and affect normal.        Cognition and Memory: Memory normal.        Judgment: Judgment normal.      I spent 20 minutes dedicated to the care of this patient (face-to-face and non-face-to-face) on the date of the encounter to include what is described in the assessment and plan.,  Delon Hope, NP 05/04/2024 2:49 PM

## 2024-06-21 ENCOUNTER — Other Ambulatory Visit: Payer: Self-pay | Admitting: Oncology

## 2024-06-21 DIAGNOSIS — N521 Erectile dysfunction due to diseases classified elsewhere: Secondary | ICD-10-CM

## 2024-07-17 ENCOUNTER — Ambulatory Visit (HOSPITAL_COMMUNITY)
Admission: RE | Admit: 2024-07-17 | Discharge: 2024-07-17 | Disposition: A | Discharge status: Home / Self Care | Source: Ambulatory Visit | Attending: Oncology | Admitting: Oncology

## 2024-07-17 ENCOUNTER — Inpatient Hospital Stay: Attending: Hematology

## 2024-07-17 DIAGNOSIS — C20 Malignant neoplasm of rectum: Secondary | ICD-10-CM | POA: Insufficient documentation

## 2024-07-17 DIAGNOSIS — N3289 Other specified disorders of bladder: Secondary | ICD-10-CM | POA: Diagnosis not present

## 2024-07-17 DIAGNOSIS — R918 Other nonspecific abnormal finding of lung field: Secondary | ICD-10-CM | POA: Diagnosis not present

## 2024-07-17 DIAGNOSIS — I700 Atherosclerosis of aorta: Secondary | ICD-10-CM | POA: Diagnosis not present

## 2024-07-17 LAB — COMPREHENSIVE METABOLIC PANEL WITH GFR
ALT: 13 U/L (ref 0–44)
AST: 23 U/L (ref 15–41)
Albumin: 4.4 g/dL (ref 3.5–5.0)
Alkaline Phosphatase: 135 U/L — ABNORMAL HIGH (ref 38–126)
Anion gap: 8 (ref 5–15)
BUN: 12 mg/dL (ref 6–20)
CO2: 24 mmol/L (ref 22–32)
Calcium: 8.9 mg/dL (ref 8.9–10.3)
Chloride: 106 mmol/L (ref 98–111)
Creatinine, Ser: 0.99 mg/dL (ref 0.61–1.24)
GFR, Estimated: >60 mL/min (ref >60)
Glucose, Bld: 97 mg/dL (ref 70–99)
Potassium: 4.2 mmol/L (ref 3.5–5.1)
Sodium: 138 mmol/L (ref 135–145)
Total Bilirubin: 0.6 mg/dL (ref 0.0–1.2)
Total Protein: 7.2 g/dL (ref 6.5–8.1)

## 2024-07-17 LAB — CBC WITH DIFFERENTIAL/PLATELET
Abs Immature Granulocytes: 0 K/uL (ref 0.00–0.07)
Basophils Absolute: 0 K/uL (ref 0.0–0.1)
Basophils Relative: 1 %
Eosinophils Absolute: 0.2 K/uL (ref 0.0–0.5)
Eosinophils Relative: 6 %
HCT: 43.9 % (ref 39.0–52.0)
Hemoglobin: 15.6 g/dL (ref 13.0–17.0)
Immature Granulocytes: 0 %
Lymphocytes Relative: 26 %
Lymphs Abs: 0.9 K/uL (ref 0.7–4.0)
MCH: 31.7 pg (ref 26.0–34.0)
MCHC: 35.5 g/dL (ref 30.0–36.0)
MCV: 89.2 fL (ref 80.0–100.0)
Monocytes Absolute: 0.3 K/uL (ref 0.1–1.0)
Monocytes Relative: 10 %
Neutro Abs: 2 K/uL (ref 1.7–7.7)
Neutrophils Relative %: 57 %
Platelets: 171 K/uL (ref 150–400)
RBC: 4.92 MIL/uL (ref 4.22–5.81)
RDW: 12 % (ref 11.5–15.5)
WBC: 3.5 K/uL — ABNORMAL LOW (ref 4.0–10.5)
nRBC: 0 % (ref 0.0–0.2)

## 2024-07-17 MED ORDER — IOHEXOL 9 MG/ML PO SOLN
500.0000 mL | ORAL | Status: AC
  Administered 2024-07-17: 500 mL via ORAL

## 2024-07-17 MED ORDER — IOHEXOL 300 MG/ML  SOLN
100.0000 mL | Freq: Once | INTRAMUSCULAR | Status: AC | PRN
  Administered 2024-07-17: 100 mL via INTRAVENOUS

## 2024-07-17 MED ORDER — IOHEXOL 9 MG/ML PO SOLN
ORAL | Status: AC
  Filled 2024-07-17: qty 1000

## 2024-07-18 ENCOUNTER — Encounter: Payer: Self-pay | Admitting: Oncology

## 2024-07-18 LAB — CEA: CEA: 2.1 ng/mL (ref 0.0–4.7)

## 2024-07-25 ENCOUNTER — Other Ambulatory Visit: Payer: Self-pay

## 2024-07-25 ENCOUNTER — Inpatient Hospital Stay: Attending: Hematology | Admitting: Oncology

## 2024-07-25 DIAGNOSIS — G629 Polyneuropathy, unspecified: Secondary | ICD-10-CM

## 2024-07-25 DIAGNOSIS — C20 Malignant neoplasm of rectum: Secondary | ICD-10-CM | POA: Diagnosis not present

## 2024-07-25 DIAGNOSIS — N522 Drug-induced erectile dysfunction: Secondary | ICD-10-CM | POA: Diagnosis not present

## 2024-07-25 DIAGNOSIS — R300 Dysuria: Secondary | ICD-10-CM | POA: Diagnosis not present

## 2024-07-25 NOTE — Progress Notes (Unsigned)
 [facility] OFFICE PROGRESS NOTE  Practice, [facility]  ASSESSMENT & PLAN:   I connected with Troy Dillon on 07/25/24 at  1:15 PM EST by telephone visit and verified that I am speaking with the correct person using two identifiers.   I discussed the limitations, risks, security and privacy concerns of performing an evaluation and management service by telemedicine and the availability of in-person appointments. I also discussed with the patient that there may be a patient responsible charge related to this service. The patient expressed understanding and agreed to proceed.   Other persons participating in the visit and their role in the encounter: NP, Patient    Patient's location: Home  Provider's location: Clinic     Assessment & Plan Rectal carcinoma [facility]) - He is having loose bowel movements twice daily.  He reports irritation of the perianal area.  Continue Anusol  as needed. - Reviewed labs from 07/17/2024 showed stable unremarkable CBC, CMP and CEA.  Signatera results are pending.  -Most recent CT CAP from 07/17/2024 which showed postsurgical change of prior low anterior resection with reanastomosis, no new suspicious soft tissue nodularity along the suture line.  Similar rightward asymmetric mesorectal and presacral soft tissue measuring up to 2 cm in thickness favored to reflect posttreatment change.  Right lower lobe pulmonary nodule which was new on prior examination has resolved in the interval.  Stable 3 mm pulmonary nodule in the lingula.  No new suspicious pulmonary nodules or masses.  Mild symmetric distal esophageal wall thickening with a prominent low paraesophageal lymph node measuring 4 mm in side possibly reflecting esophagitis but nonspecific.  Could consider evaluation by upper endoscopy.  Other findings discussed.  Discussed with patient that I will reach out to Dr. Dominique regarding esophageal wall thickening with paraesophageal lymph node to see what  his thoughts are and if EGD was warranted. -Colonoscopy from 11/11/2023 showed normal anastomosis.  Repeat in 3 years per GI. -Surveillance plan is lab work and CEA every 3 months until April 2026 and imaging and Signatera test every 6 months until April 2026.  After that, we will do Signatera every 6 months and imaging once a year up to 5 years.  Neuropathy - Constant numbness in the right hand first 3 fingers is stable.  Reports new onset middle finger joint pain when gripping.  We discussed watching and waiting as neuropathy and numbness has resolved.  No intervention necessary.   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 Continue sildenafil  25 mg as needed for ED. Dysuria Incidentally noted to have mild symmetric wall thickening of the urinary bladder which may reflect cystitis.  Correlate with urinalysis. He does report some dysuria intermittently. Recommend urinalysis with urine culture we will call with results.  Orders Placed This Encounter  Procedures   Urine Culture    Standing Status:   Future    Expected Date:   07/25/2024    Expiration Date:   07/25/2025   Urinalysis, Routine w reflex microscopic    Standing Status:   Future    Expected Date:   07/25/2024    Expiration Date:   07/25/2025   CBC with Differential    Standing Status:   Future    Expected Date:   10/23/2024    Expiration Date:   01/21/2025   Comprehensive metabolic panel    Standing Status:   Future    Expected Date:   10/23/2024    Expiration Date:   01/21/2025  CEA    Standing Status:   Future    Expected Date:   10/23/2024    Expiration Date:   01/21/2025   INTERVAL HISTORY: Patient returns for follow-up for rectal carcinoma.  He denies any interval hospitalizations, surgeries or changes to his baseline health.  Patient had a colonoscopy on 11/11/2023 which showed hyperplastic changes to the rectum and recommended a colonoscopy in 3 years.  Patient recently had a CT  chest abdomen pelvis on 07/17/2024 which showed postsurgical change of prior low anterior resection with reanastomosis, no new suspicious soft tissue nodularity along the suture line.  Similar rightward asymmetric mesorectal and presacral soft tissue measuring up to 2 cm in thickness favored to reflect posttreatment change.  Right lower lobe pulmonary nodule which was new on prior examination has resolved in the interval.  Stable 3 mm pulmonary nodule in the lingula.  No new suspicious pulmonary nodules or masses.  Mild symmetric distal esophageal wall thickening with a prominent low paraesophageal lymph node measuring 4 mm in side possibly reflecting esophagitis but nonspecific.  Could consider evaluation by upper endoscopy.  Other findings discussed.  Reports most of the neuropathy has resolved but he has noticed middle finger joint pain when gripping or lifting items.  Reports it does not happen every time but when it does it feels like his joints are fracturing.  It takes him several minutes for the symptoms to resolve.  He has not tried anything for this.   Reports erectile dysfunction symptoms.  States following chemotherapy and radiation he had some problem but now it has worsened.  He denies any interval hospitalizations, surgeries or changes to baseline health.  We reviewed CBC, CMP, CEA.  SUMMARY OF HEMATOLOGIC HISTORY: Oncology History  Rectal carcinoma (HCC)  06/17/2022 Initial Diagnosis   Rectal carcinoma (HCC)   06/29/2022 - 10/07/2022 Chemotherapy   Patient is on Treatment Plan : COLORECTAL FOLFOX q14d x 4 months      Genetic Testing   [facility] Common Cancer Panel+RNA was Negative. Report date is 08/29/2022.  The Common Hereditary Cancers Panel offered by [facility] includes sequencing and/or deletion duplication testing of the following 48 genes: APC, ATM, AXIN2, BAP1, BARD1, BMPR1A, BRCA1, BRCA2, BRIP1, CDH1, CDK4, CDKN2A (p14ARF and p16INK4a only), CHEK2, CTNNA1, DICER1, EPCAM  (Deletion/duplication testing only), FH, GREM1 (promoter region duplication testing only), HOXB13, KIT, MBD4, MEN1, MLH1, MSH2, MSH3, MSH6, MUTYH, NF1, NHTL1, PALB2, PDGFRA, PMS2, POLD1, POLE, PTEN, RAD51C, RAD51D, SDHA (sequencing analysis only except exon 14), SDHB, SDHC, SDHD, SMAD4, SMARCA4. STK11, TP53, TSC1, TSC2, and VHL.    1.  Stage IIIB (T3cN1) rectal adenocarcinoma, MSI stable: - Presentation with rectal bleeding - Colonoscopy (06/08/2022): Fungating partially obstructing large mass found in the rectum 11 cm from anal verge.  Partially circumferential involving one half of the lumen.  Mass measured 3 cm in length.  Diameter 2 cm.  Oozing present.  1 semipedunculated polyp (40 cm from anus) and 3 sessile polyps in the cecum removed. - Pathology (06/08/2022): Rectal mass 11 cm from anal verge-adenocarcinoma.  MMR preserved.  MSI-stable. - CEA (06/08/2022): 2.9 - MRI pelvis (06/10/2022): T3CN1, tumor extends into sigmoid from the high rectum, extension through muscularis propria, approximately 6 mm beyond the rectal wall.  Tumor begins in the highly portion of the rectum and extends into the sigmoid colon.  Extramural vascular invasion/tumor thrombus along the right lateral margin.  Shortest distance of tumor from mesorectal fascia: 15 mm.  Single high mesorectal or superior rectal lymph node measuring  9 mm.  Distance from tumor to internal anal sphincter is approximately 7-11 cm. - CT CAP (06/10/2022): Circumferential wall thickening of the upper/mid rectum, mildly enlarged high perirectal lymph nodes measuring up to 6 mm in short axis.  No evidence of distant metastatic disease in the chest, abdomen or pelvis. - Neoadjuvant FOLFOX 8 cycles from 06/29/2022 through 10/05/2022 - MRI pelvis (10/13/2022): Evidence of decreased extramural vascular invasion along the right lateral margin.  Tumor length 3.4 cm, previously 4.5 cm.  Significant interval decrease thickening of high rectum/low sigmoid colon.   Mesorectal lymph nodes 7 mm, previously 9 mm in 4 mm node.  I discussed with radiologist who felt that there is more than 20% clinical improvement. - I have recommended foregoing radiation based on Prospect trial data.  He met with Dr. Teresa and Dr. Dewey who have recommended chemoradiation. - Chemoradiation with Xeloda  from 11/11/2022 through 12/18/2022. - Robotic assisted low anterior resection on 02/17/2023 by Dr. Teresa - Pathology: [id number], 2/22 lymph nodes involved, margins negative, residual moderate differentiated adenocarcinoma discontinuously involving a fibrotic area of about 2.2 cm.  MMR preserved.   2.  Social/family history: - He lives at home with his wife Olam.  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  Denies any chemical exposure.  Does not smoke cigarettes but dips tobacco. - Father had colon cancer at age 22. - Paternal grandmother died of colon cancer. - Paternal aunt had colon cancer. - Mother had male cancer and underwent hysterectomy.  CBC    Component Value Date/Time   WBC 3.5 (L) 07/17/2024 1012   RBC 4.92 07/17/2024 1012   HGB 15.6 07/17/2024 1012   HCT 43.9 07/17/2024 1012   PLT 171 07/17/2024 1012   MCV 89.2 07/17/2024 1012   MCH 31.7 07/17/2024 1012   MCHC 35.5 07/17/2024 1012   RDW 12.0 07/17/2024 1012   LYMPHSABS 0.9 07/17/2024 1012   MONOABS 0.3 07/17/2024 1012   EOSABS 0.2 07/17/2024 1012   BASOSABS 0.0 07/17/2024 1012       Latest Ref Rng & Units 07/17/2024   10:12 AM 04/27/2024    2:14 PM 01/12/2024    3:07 PM  CMP  Glucose 70 - 99 mg/dL 97  92  92   BUN 6 - 20 mg/dL 12  14  18    Creatinine 0.61 - 1.24 mg/dL 9.00  8.94  8.84   Sodium 135 - 145 mmol/L 138  138  134   Potassium 3.5 - 5.1 mmol/L 4.2  3.9  3.8   Chloride 98 - 111 mmol/L 106  103  106   CO2 22 - 32 mmol/L 24  24  24    Calcium  8.9 - 10.3 mg/dL 8.9  8.9  8.7   Total Protein 6.5 - 8.1 g/dL 7.2  7.4  6.9   Total Bilirubin 0.0 - 1.2 mg/dL 0.6  1.2  1.0   Alkaline Phos 38 -  126 U/L 135  116  121   AST 15 - 41 U/L 23  18  21    ALT 0 - 44 U/L 13  16  18       Lab Results  Component Value Date   FERRITIN 59 10/12/2023    There were no vitals filed for this visit.   Review of System:  Review of Systems  Respiratory:  Positive for cough.   Gastrointestinal:  Positive for blood in stool.  Neurological:  Positive for tingling and sensory change.  Psychiatric/Behavioral:  The patient has  insomnia.     Physical Exam: Physical Exam Neurological:     Mental Status: He is alert and oriented to person, place, and time.     I provided 20 minutes of non face-to-face telephone visit time during this encounter, and > 50% was spent counseling as documented under my assessment & plan.   Delon Hope, NP 07/25/2024 2:49 PM

## 2024-07-25 NOTE — Assessment & Plan Note (Addendum)
-   Constant numbness in the right hand first 3 fingers is stable.  Reports new onset middle finger joint pain when gripping.  We discussed watching and waiting as neuropathy and numbness has resolved.  No intervention necessary.

## 2024-07-25 NOTE — Assessment & Plan Note (Addendum)
 Etiology felt to be secondary to chemoradiation. Continue sildenafil  25 mg as needed for ED.

## 2024-07-25 NOTE — Assessment & Plan Note (Addendum)
-   He is having loose bowel movements twice daily.  He reports irritation of the perianal area.  Continue Anusol  as needed. - Reviewed labs from 07/17/2024 showed stable unremarkable CBC, CMP and CEA.  Signatera results are pending.  -Most recent CT CAP from 07/17/2024 which showed postsurgical change of prior low anterior resection with reanastomosis, no new suspicious soft tissue nodularity along the suture line.  Similar rightward asymmetric mesorectal and presacral soft tissue measuring up to 2 cm in thickness favored to reflect posttreatment change.  Right lower lobe pulmonary nodule which was new on prior examination has resolved in the interval.  Stable 3 mm pulmonary nodule in the lingula.  No new suspicious pulmonary nodules or masses.  Mild symmetric distal esophageal wall thickening with a prominent low paraesophageal lymph node measuring 4 mm in side possibly reflecting esophagitis but nonspecific.  Could consider evaluation by upper endoscopy.  Other findings discussed.  Discussed with patient that I will reach out to Dr. Dominique regarding esophageal wall thickening with paraesophageal lymph node to see what his thoughts are and if EGD was warranted. -Colonoscopy from 11/11/2023 showed normal anastomosis.  Repeat in 3 years per GI. -Surveillance plan is lab work and CEA every 3 months until April 2026 and imaging and Signatera test every 6 months until April 2026.  After that, we will do Signatera every 6 months and imaging once a year up to 5 years.

## 2024-07-26 ENCOUNTER — Ambulatory Visit: Payer: Self-pay | Admitting: Oncology

## 2024-07-26 ENCOUNTER — Inpatient Hospital Stay

## 2024-07-26 DIAGNOSIS — R300 Dysuria: Secondary | ICD-10-CM | POA: Diagnosis present

## 2024-07-26 LAB — URINALYSIS, ROUTINE W REFLEX MICROSCOPIC
Bilirubin Urine: NEGATIVE
Glucose, UA: NEGATIVE mg/dL
Hgb urine dipstick: NEGATIVE
Ketones, ur: NEGATIVE mg/dL
Leukocytes,Ua: NEGATIVE
Nitrite: NEGATIVE
Protein, ur: NEGATIVE mg/dL
Specific Gravity, Urine: 1.019 (ref 1.005–1.030)
pH: 6 (ref 5.0–8.0)

## 2024-07-26 NOTE — Progress Notes (Signed)
 Hey Troy Dillon, his urinalysis was negative.  Do mind letting him know.  We did send this for culture so if anything grows I will let him know.

## 2024-07-26 NOTE — Progress Notes (Signed)
 Patient made aware and verbalized understanding.

## 2024-07-26 NOTE — Progress Notes (Signed)
 Hi Troy Dillon , thanks for reaching out  Yes there is indication for upper endoscopy given abnormal CT and he has history of malignancy, we can try to scheduled this directly   =========  Hi Troy Dillon,   Can you please schedule a Upper endoscopy? Dx: Abnormal CT , esophagitis , GERD . Room: Any   Thanks,  Sulayman Manning Faizan Lovey Crupi, MD Gastroenterology and Hepatology [facility]

## 2024-07-27 LAB — SIGNATERA
SIGNATERA MTM READOUT: 0.24 MTM/ml — AB
SIGNATERA TEST RESULT: POSITIVE — AB

## 2024-07-27 LAB — URINE CULTURE: Culture: <10000 — AB

## 2024-07-27 NOTE — Telephone Encounter (Signed)
Please respond to patient  Thank you

## 2024-08-07 ENCOUNTER — Telehealth (INDEPENDENT_AMBULATORY_CARE_PROVIDER_SITE_OTHER): Payer: Self-pay

## 2024-08-07 ENCOUNTER — Ambulatory Visit (INDEPENDENT_AMBULATORY_CARE_PROVIDER_SITE_OTHER): Admitting: Gastroenterology

## 2024-08-07 ENCOUNTER — Encounter (INDEPENDENT_AMBULATORY_CARE_PROVIDER_SITE_OTHER): Payer: Self-pay | Admitting: Gastroenterology

## 2024-08-07 VITALS — BP 134/71 | HR 74 | Temp 98.1°F | Ht 69.0 in | Wt 190.5 lb

## 2024-08-07 DIAGNOSIS — K219 Gastro-esophageal reflux disease without esophagitis: Secondary | ICD-10-CM | POA: Insufficient documentation

## 2024-08-07 DIAGNOSIS — R933 Abnormal findings on diagnostic imaging of other parts of digestive tract: Secondary | ICD-10-CM | POA: Diagnosis not present

## 2024-08-07 DIAGNOSIS — F1090 Alcohol use, unspecified, uncomplicated: Secondary | ICD-10-CM | POA: Diagnosis not present

## 2024-08-07 MED ORDER — PANTOPRAZOLE SODIUM 40 MG PO TBEC: 40.0000 mg | DELAYED_RELEASE_TABLET | Freq: Every day | ORAL | 1 refills | Status: DC

## 2024-08-07 NOTE — Telephone Encounter (Signed)
 Spoke with patient in the office, scheduled EGD for 08/28/2024 at 8:30am. Instructions sent to [facility].

## 2024-08-07 NOTE — Telephone Encounter (Signed)
 PA not required through [employer]

## 2024-08-07 NOTE — Progress Notes (Signed)
 Referring Provider: [facility] Physician:  [facility] Physician: Dr. Cinderella   Chief Complaint  Patient presents with   Esophageal Thickening    Pt arrives due to thickening of esophagus. Pt had CT scan in November and showed Mild symmetric distal esophageal wall thickening with a prominent low paraesophageal lymph node measuring 4 mm, possibly reflecting esophagitis but nonspecific. Consider further evaluation by upper endoscopy. Pt reports no issues with swallowing. Has not had EGD in the past.    HPI:   Troy Dillon is a 53 y.o. male with past medical history of stage III rectal cancer diagnosed 05/2022 at [facility] followed by [facility] 01/2023 , Loop ileostomy reversal 03/2023,  follows with Oncology at [facility] and Surgery at [facility]   Patient presenting today for: Abnormal CT of esophagus  GERD  Last seen by Dr. Cinderella in February, at that time having 5-6 liquid stools per day if not taking imodium , seen [facility] but told this was self limiting.   Posttreatment colonoscopy to be repeated 1 year after diagnosis, recommended to schedule colonoscopy, If normal will repeat 3 years followed by every 5 years unless new symptoms or indication, start fiber and stool bulking agent such as psyllium and as needed loperamide , Recommend high-fiber, plant-based diet with low red/processed meat, Also recommended first-degree relative to pursue screening colonoscopy at least age 7 or earlier if any symptoms  CT Chest abd pelvis w contrast 07/17/24: Postsurgical change of prior low anterior resection with reanastomosis, no new suspicious soft tissue nodularity along the suture line. 2. Similar rightward asymmetric mesorectal/presacral soft tissue measuring up to 2 cm in thickness, favored to reflect posttreatment change. 3. Right lower lobe pulmonary nodule which was new on prior examination has resolved in the interval. Stable 3 mm  pulmonary nodule in the lingula and anterior left upper lobe. No new suspicious pulmonary nodules or masses. 4. Mild symmetric distal esophageal wall thickening with a prominent low paraesophageal lymph node measuring 4 mm, possibly reflecting esophagitis but nonspecific. Consider further evaluation by upper endoscopy. 5. Mild symmetric wall thickening of the urinary bladder, which may reflect cystitis. Correlate with urinalysis.  Last CEA 11/24 2.1  CBC with WBC 3.5, otherwise normal CMP normal other than Alk phos 135, other LFTs WNL to include T bili 0.6  Present:  States he has noticed some reflux symptoms, maybe a few times per week recently. He had 3 episodes yesterday. He notes he has not paid much attention to symptoms prior to having CT that was abnormal. He is not taking anything for his symptoms currently. Denies any dysphagia or odynophagia. No nausea or vomiting. Appetite is good, no weight loss. Denies any rectal bleeding. Denies recent steroids, antibiotics, no NSAID use. No tobacco. He does drink beer and bourbon usually daily though less than previously.    Last Colonoscopy:10/2023  - Patent end-to-end colo-rectal anastomosis,  characterized by healthy appearing mucosa. Biopsied. - Erythematous mucosa in the rectum. Biopsied. RECTUM, BIOPSY:       Hyperplastic polyp.       Negative for dysplasia.   Repeat Colonoscopy 3 years   Last Endoscopy: never   [facility] Weights   08/07/24 1015  Weight: 190 lb 8 oz (86.4 kg)     Past Medical History:  Diagnosis Date   Anemia    Anxiety    Depression    Neuromuscular disorder (HCC)    Neuropathy d/t chemo   Rectal cancer (HCC) 06/08/2022  Past Surgical History:  Procedure Laterality Date   COLONOSCOPY  06/08/2022   COLONOSCOPY WITH PROPOFOL  N/A 11/11/2023   Procedure: COLONOSCOPY WITH PROPOFOL ;  Surgeon: Cinderella Deatrice FALCON, MD;  [facility]: [facility];  Service: Endoscopy;  Laterality: N/A;  9:00AM;ASA 1-2   EYE  SURGERY     FLEXIBLE SIGMOIDOSCOPY N/A 02/17/2023   Procedure: FLEXIBLE SIGMOIDOSCOPY;  Surgeon: Teresa Lonni HERO, MD;  Location: [facility];  Service: General;  Laterality: N/A;   FLEXIBLE SIGMOIDOSCOPY N/A 03/22/2023   Procedure: FLEXIBLE SIGMOIDOSCOPY;  Surgeon: Teresa Lonni HERO, MD;  Location: [facility];  Service: General;  Laterality: N/A;   FRACTURE SURGERY     Right Femur- Cables in bone remain in place, rod removed in 2000   ILEOSTOMY CLOSURE N/A 05/14/2023   Procedure: LOOP ILEOSTOMY TAKEDOWN;  Surgeon: Teresa Lonni HERO, MD;  Location: [facility];  Service: General;  Laterality: N/A;   IR IMAGING GUIDED PORT INSERTION  06/22/2022   ORTHOPEDIC SURGERY     PORT-A-CATH REMOVAL N/A 05/14/2023   Procedure: REMOVAL PORT-A-CATH;  Surgeon: Teresa Lonni HERO, MD;  Location: [facility];  Service: General;  Laterality: N/A;   XI ROBOTIC ASSISTED LOWER ANTERIOR RESECTION Bilateral 02/17/2023   Procedure: XI ROBOTIC ASSISTED LOWER ANTERIOR RESECTION, DIVERTING LOOP ILEOSTOMY, BILATERAL TAP BLOCK, TISSUE PERFUSION ASSESSMENT VIA FIREFLY INJECTION;  Surgeon: Teresa Lonni HERO, MD;  Location: [facility];  Service: General;  Laterality: Bilateral;    Current Outpatient Medications  Medication Sig Dispense Refill   escitalopram  (LEXAPRO ) 10 MG tablet TAKE 1 TABLET BY MOUTH EVERY DAY 90 tablet 3   hydrocortisone  (ANUSOL -HC) 2.5 % rectal cream Place 1 Application rectally 2 (two) times daily. 30 g 6   PRESCRIPTION MEDICATION Simbrinza - Prescription eye drop for glaucoma.     sildenafil  (VIAGRA ) 25 MG tablet TAKE 1 TABLET BY MOUTH DAILY AS NEEDED FOR ERECTILE DYSFUNCTION. 45 tablet 0   SIMBRINZA 1-0.2 % SUSP Apply to eye.     Multiple Vitamin (MULTIVITAMIN) tablet Take 1 tablet by mouth daily. (Patient not taking: Reported on 08/07/2024)     No current facility-administered medications for this visit.    Allergies as of 08/07/2024 - Review Complete 08/07/2024  Allergen Reaction Noted    Hydrocodone Itching 09/11/2021   Latex  06/18/2022    Social History   Socioeconomic History   Marital status: Married    Spouse name: Not on file   Number of children: Not on file   Years of education: Not on file   Highest education level: Not on file  Occupational History   Not on file  Tobacco Use   Smoking status: Former    Current packs/day: 0.00    Average packs/day: 1 pack/day for 15.0 years (15.0 ttl pk-yrs)    Types: Cigarettes    Start date: 74    Quit date: 2003    Years since quitting: 22.9    Passive exposure: Past   Smokeless tobacco: Former    Types: Snuff   Tobacco comments:    Some Dipping  Vaping Use   Vaping status: Never Used  Substance and Sexual Activity   Alcohol use: Yes    Comment: occasionally   Drug use: Never   Sexual activity: Not on file  Other Topics Concern   Not on file  Social History Narrative   Not on file   Social Drivers of Health   Tobacco Use: Medium Risk (08/07/2024)   Patient History    Smoking Tobacco Use: Former  Smokeless Tobacco Use: Former    Passive Exposure: Past  Programmer, Applications: Not on file  Food Insecurity: No Food Insecurity (05/16/2023)   Hunger Vital Sign    Worried About Running Out of Food in the Last Year: Never true    Ran Out of Food in the Last Year: Never true  Transportation Needs: No Transportation Needs (05/16/2023)   PRAPARE - Administrator, Civil Service (Medical): No    Lack of Transportation (Non-Medical): No  Physical Activity: Not on file  Stress: Not on file  Social Connections: Not on file  Depression (PHQ2-9): Low Risk (07/25/2024)   Depression (PHQ2-9)    PHQ-2 Score: 0  Alcohol Screen: Not on file  Housing: Patient Declined (05/16/2023)   Housing    Last Housing Risk Score: 0  Utilities: Not At Risk (05/16/2023)   [facility] Utilities    Threatened with loss of utilities: No  Health Literacy: Not on file    Review of systems General: negative for malaise,  night sweats, fever, chills, weight loss Neck: Negative for lumps, goiter, pain and significant neck swelling Resp: Negative for cough, wheezing, dyspnea at rest CV: Negative for chest pain, leg swelling, palpitations, orthopnea GI: denies melena, hematochezia, nausea, vomiting, diarrhea, constipation, dysphagia, odyonophagia, early satiety or unintentional weight loss. +acid regurgitation  MSK: Negative for joint pain or swelling, back pain, and muscle pain. Derm: Negative for itching or rash Psych: Denies depression, anxiety, memory loss, confusion. No homicidal or suicidal ideation.  Heme: Negative for prolonged bleeding, bruising easily, and swollen nodes. Endocrine: Negative for cold or heat intolerance, polyuria, polydipsia and goiter. Neuro: negative for tremor, gait imbalance, syncope and seizures. The remainder of the review of systems is noncontributory.  Physical Exam: BP 134/71   Pulse 74   Temp 98.1 F (36.7 C)   Ht 5' 9 (1.753 m)   Wt 190 lb 8 oz (86.4 kg)   BMI 28.13 kg/m  General:   Alert and oriented. No distress noted. Pleasant and cooperative.  Head:  Normocephalic and atraumatic. Eyes:  Conjuctiva clear without scleral icterus. Mouth:  Oral mucosa pink and moist. Good dentition. No lesions. Heart: Normal rate and rhythm, s1 and s2 heart sounds present.  Lungs: Clear lung sounds in all lobes. Respirations equal and unlabored. Abdomen:  +BS, soft, non-tender and non-distended. No rebound or guarding. No HSM or masses noted. Derm: No palmar erythema or jaundice Msk:  Symmetrical without gross deformities. Normal posture. Extremities:  Without edema. Neurologic:  Alert and  oriented x4 Psych:  Alert and cooperative. Normal mood and affect.  Invalid input(s): 6 MONTHS   ASSESSMENT: Kemuel Knappenberger is a 53 y.o. male presenting today for GERD and abnormal imaging of esophagus on CT imaging  More acid regurgitation recently, not taking anything currently. Denies  nausea, vomiting, dysphagia, appetite changes, weight loss, rectal bleeding or melena. Recent CT with some distal esophageal wall thickening and prominent low paraesophageal lymph node measuring 4mm, new since last imaging in may. He does drink ETOH daily which we discussed is likely contributing to his reflux symptoms. I am recommending EGD for further evaluation of these findings as this may be related to reflux but especially given his cancer history would be important to evaluate further. For now we will start daily PPI, discussed good reflux precautions. Indications, risks and benefits of procedure discussed in detail with patient. Patient verbalized understanding and is in agreement to proceed with EGD.    PLAN:  -schedule EGD  ASA III -start protonix  40mg  daily -good reflux precautions   All questions were answered, patient verbalized understanding and is in agreement with plan as outlined above.   Follow Up: 3 months   Morio Widen L. Jamaiyah Pyle, MSN, APRN, AGNP-C Adult-Gerontology Nurse Practitioner [facility]

## 2024-08-07 NOTE — Patient Instructions (Signed)
 I have sent protonix  40mg  to your pharmacy Please take this 30 minutes prior to breakfast Avoid greasy, spicy, fried, citrus foods, and be mindful that caffeine, carbonated drinks, chocolate and alcohol can increase reflux symptoms Stay upright 2-3 hours after eating, prior to lying down and avoid eating late in the evenings. We will get you scheduled for upper endoscopy for further evaluation  Follow up 3 months

## 2024-08-09 ENCOUNTER — Other Ambulatory Visit: Payer: Self-pay | Admitting: Oncology

## 2024-08-09 DIAGNOSIS — N521 Erectile dysfunction due to diseases classified elsewhere: Secondary | ICD-10-CM

## 2024-08-21 ENCOUNTER — Encounter: Payer: Self-pay | Admitting: *Deleted

## 2024-08-21 ENCOUNTER — Encounter: Payer: Self-pay | Admitting: Oncology

## 2024-08-23 ENCOUNTER — Encounter (HOSPITAL_COMMUNITY): Payer: Self-pay

## 2024-08-23 ENCOUNTER — Encounter (HOSPITAL_COMMUNITY)
Admission: RE | Admit: 2024-08-23 | Discharge: 2024-08-23 | Disposition: A | Discharge status: Home / Self Care | Payer: Self-pay | Source: Ambulatory Visit | Attending: Gastroenterology | Admitting: Gastroenterology

## 2024-08-23 ENCOUNTER — Other Ambulatory Visit: Payer: Self-pay

## 2024-08-23 HISTORY — DX: Unspecified glaucoma: H40.9

## 2024-08-23 HISTORY — DX: Gastro-esophageal reflux disease without esophagitis: K21.9

## 2024-08-28 ENCOUNTER — Telehealth (INDEPENDENT_AMBULATORY_CARE_PROVIDER_SITE_OTHER): Payer: Self-pay | Admitting: *Deleted

## 2024-08-28 ENCOUNTER — Other Ambulatory Visit: Payer: Self-pay

## 2024-08-28 ENCOUNTER — Ambulatory Visit (HOSPITAL_COMMUNITY)
Admission: RE | Admit: 2024-08-28 | Discharge: 2024-08-28 | Disposition: A | Discharge status: Home / Self Care | Payer: Self-pay | Attending: Gastroenterology | Admitting: Gastroenterology

## 2024-08-28 ENCOUNTER — Encounter (HOSPITAL_COMMUNITY)
Admission: RE | Disposition: A | Discharge status: Home / Self Care | Payer: Self-pay | Source: Home / Self Care | Attending: Gastroenterology

## 2024-08-28 ENCOUNTER — Encounter (HOSPITAL_COMMUNITY): Payer: Self-pay | Admitting: Gastroenterology

## 2024-08-28 ENCOUNTER — Ambulatory Visit (HOSPITAL_COMMUNITY): Admitting: Anesthesiology

## 2024-08-28 DIAGNOSIS — K2970 Gastritis, unspecified, without bleeding: Secondary | ICD-10-CM | POA: Insufficient documentation

## 2024-08-28 DIAGNOSIS — K219 Gastro-esophageal reflux disease without esophagitis: Secondary | ICD-10-CM | POA: Diagnosis not present

## 2024-08-28 DIAGNOSIS — R933 Abnormal findings on diagnostic imaging of other parts of digestive tract: Secondary | ICD-10-CM | POA: Insufficient documentation

## 2024-08-28 DIAGNOSIS — Z85048 Personal history of other malignant neoplasm of rectum, rectosigmoid junction, and anus: Secondary | ICD-10-CM | POA: Diagnosis not present

## 2024-08-28 DIAGNOSIS — K2950 Unspecified chronic gastritis without bleeding: Secondary | ICD-10-CM | POA: Diagnosis not present

## 2024-08-28 DIAGNOSIS — Z87891 Personal history of nicotine dependence: Secondary | ICD-10-CM | POA: Insufficient documentation

## 2024-08-28 DIAGNOSIS — K209 Esophagitis, unspecified without bleeding: Secondary | ICD-10-CM | POA: Insufficient documentation

## 2024-08-28 HISTORY — PX: ESOPHAGOGASTRODUODENOSCOPY: SHX5428

## 2024-08-28 MED ORDER — LACTATED RINGERS IV SOLN: INTRAVENOUS | Status: DC | PRN

## 2024-08-28 MED ORDER — LIDOCAINE 2% (20 MG/ML) 5 ML SYRINGE
INTRAMUSCULAR | Status: DC | PRN
  Administered 2024-08-28: 100 mg via INTRAVENOUS

## 2024-08-28 MED ORDER — LACTATED RINGERS IV SOLN: INTRAVENOUS | Status: DC

## 2024-08-28 MED ORDER — PANTOPRAZOLE SODIUM 40 MG PO TBEC: 40.0000 mg | DELAYED_RELEASE_TABLET | Freq: Two times a day (BID) | ORAL | 1 refills | Status: AC

## 2024-08-28 MED ORDER — PROPOFOL 10 MG/ML IV BOLUS
INTRAVENOUS | Status: DC | PRN
  Administered 2024-08-28 (×3): 100 mg via INTRAVENOUS

## 2024-08-28 NOTE — Telephone Encounter (Signed)
 Per EGD op note Repeat upper endoscopy in 12 weeks for surveillance

## 2024-08-28 NOTE — Op Note (Signed)
 [facility] Patient Name: Troy Dillon Procedure Date: 08/28/2024 8:19 AM MRN: [id number] Date of Birth: December 21, 1970 Attending MD: Deatrice Dine , MD, [id number] CSN: [id number] Age: 54 Admit Type: Outpatient Procedure:                Upper GI endoscopy Indications:              Abnormal CT of the GI tract: esophageal wall                            thickeing Providers:                Deatrice Dine, MD, Devere Lodge, Bascom Blush Referring MD:              Medicines:                Monitored Anesthesia Care Complications:            No immediate complications. Estimated Blood Loss:     Estimated blood loss was minimal. Procedure:                Pre-Anesthesia Assessment:                           - Prior to the procedure, a History and Physical                            was performed, and patient medications and                            allergies were reviewed. The patient's tolerance of                            previous anesthesia was also reviewed. The risks                            and benefits of the procedure and the sedation                            options and risks were discussed with the patient.                            All questions were answered, and informed consent                            was obtained. Prior Anticoagulants: The patient has                            taken no anticoagulant or antiplatelet agents. ASA                            Grade Assessment: II - A patient with mild systemic                            disease. After reviewing the risks and benefits,  the patient was deemed in satisfactory condition to                            undergo the procedure.                           After obtaining informed consent, the endoscope was                            passed under direct vision. Throughout the                            procedure, the patient's blood pressure, pulse, and                            oxygen  saturations were monitored continuously. The                            [id number] ([id number]) Upper was introduced through                            the mouth, and advanced to the second part of                            duodenum. The upper GI endoscopy was accomplished                            without difficulty. The patient tolerated the                            procedure well. Scope In: 9:15:07 AM Scope Out: 9:24:38 AM Total Procedure Duration: 0 hours 9 minutes 31 seconds  Findings:      Mucosal changes including ringed esophagus, feline appearance,       longitudinal furrows and white plaques were found in the entire       esophagus. Esophageal findings were graded using the Eosinophilic       Esophagitis Endoscopic Reference Score (EoE-EREFS) as: Edema Grade 1       Present (decreased clarity or absence of vascular markings), Rings Grade       2 Moderate (distinct rings that do not occlude passage of diagnostic       8-10 mm endoscope), Exudates Grade 1 Mild (scattered white lesions       involving less than 10 percent of the esophageal surface area), Furrows       Grade 1 Mild (vertical lines without visible depth) and Stricture none       (no stricture found). Biopsies were obtained from the proximal and       distal esophagus with cold forceps for histology of suspected       eosinophilic esophagitis.      The Z-line was regular and was found at the gastroesophageal junction.       This was biopsied with a cold forceps for histology.      Mild inflammation characterized by erythema was found in the gastric       antrum. Biopsies were taken with a cold forceps for histology.  The duodenal bulb and second portion of the duodenum were normal. Impression:               - Esophageal mucosal changes suspicious for                            eosinophilic esophagitis.                           - Z-line regular, at the gastroesophageal junction.                             Biopsied.                           - Gastritis. Biopsied.                           - Normal duodenal bulb and second portion of the                            duodenum.                           - Biopsies were taken with a cold forceps for                            evaluation of eosinophilic esophagitis. Moderate Sedation:      Per Anesthesia Care Recommendation:           - Patient has a contact number available for                            emergencies. The signs and symptoms of potential                            delayed complications were discussed with the                            patient. Return to normal activities tomorrow.                            Written discharge instructions were provided to the                            patient.                           - Resume previous diet.                           - Continue present medications.                           - Await pathology results.                           - Repeat upper endoscopy in 12 weeks for  surveillance based on pathology results.                           - Return to GI clinic as previously scheduled.                           -PPI BID Procedure Code(s):        --- Professional ---                           [phone], Esophagogastroduodenoscopy, flexible,                            transoral; with biopsy, single or multiple Diagnosis Code(s):        --- Professional ---                           K22.89, Other specified disease of esophagus                           K29.70, Gastritis, unspecified, without bleeding                           R93.3, Abnormal findings on diagnostic imaging of                            other parts of digestive tract CPT copyright 2022 American Medical Association. All rights reserved. The codes documented in this report are preliminary and upon coder review may  be revised to meet current compliance requirements. Deatrice Dine, MD Deatrice Dine, MD 08/28/2024 9:35:14 AM This report has been signed electronically. Number of Addenda: 0

## 2024-08-28 NOTE — Anesthesia Preprocedure Evaluation (Signed)
 Anesthesia Evaluation  Patient identified by MRN, date of birth, ID band Patient awake    Reviewed: Allergy & Precautions, H&P , NPO status , Patient's Chart, lab work & pertinent test results, reviewed documented beta blocker date and time   Airway Mallampati: II  TM Distance: >3 FB Neck ROM: full    Dental no notable dental hx.    Pulmonary neg pulmonary ROS, former smoker   Pulmonary exam normal breath sounds clear to auscultation       Cardiovascular Exercise Tolerance: Good hypertension, negative cardio ROS  Rhythm:regular Rate:Normal     Neuro/Psych negative neurological ROS  negative psych ROS   GI/Hepatic negative GI ROS, Neg liver ROS,,,  Endo/Other  negative endocrine ROS    Renal/GU negative Renal ROS  negative genitourinary   Musculoskeletal   Abdominal   Peds  Hematology negative hematology ROS (+)   Anesthesia Other Findings   Reproductive/Obstetrics negative OB ROS                              Anesthesia Physical Anesthesia Plan  ASA: 2  Anesthesia Plan: MAC   Post-op Pain Management:    Induction:   PONV Risk Score and Plan: Propofol  infusion  Airway Management Planned:   Additional Equipment:   Intra-op Plan:   Post-operative Plan:   Informed Consent: I have reviewed the patients History and Physical, chart, labs and discussed the procedure including the risks, benefits and alternatives for the proposed anesthesia with the patient or authorized representative who has indicated his/her understanding and acceptance.     Dental Advisory Given  Plan Discussed with: CRNA  Anesthesia Plan Comments:         Anesthesia Quick Evaluation

## 2024-08-28 NOTE — Discharge Instructions (Signed)

## 2024-08-28 NOTE — Anesthesia Postprocedure Evaluation (Signed)
"   Anesthesia Post Note  Patient: Troy Dillon  Procedure(s) Performed: EGD (ESOPHAGOGASTRODUODENOSCOPY)  Patient location during evaluation: Phase II Anesthesia Type: MAC Level of consciousness: awake Pain management: pain level controlled Vital Signs Assessment: post-procedure vital signs reviewed and stable Respiratory status: spontaneous breathing and respiratory function stable Cardiovascular status: blood pressure returned to baseline and stable Postop Assessment: no headache and no apparent nausea or vomiting Anesthetic complications: no Comments: Late entry   There were no known notable events for this encounter.   Last Vitals:  Vitals:   08/28/24 0936 08/28/24 0942  BP: (!) 94/48 93/62  Pulse: 76   Resp: 12   Temp: 36.6 C   SpO2: 99%     Last Pain:  Vitals:   08/28/24 0942  TempSrc:   PainSc: 0-No pain                 Yvonna PARAS Taden Witter      "

## 2024-08-28 NOTE — Transfer of Care (Signed)
 Immediate Anesthesia Transfer of Care Note  Patient: Troy Dillon  Procedure(s) Performed: EGD (ESOPHAGOGASTRODUODENOSCOPY)  Patient Location: PACU  Anesthesia Type:MAC  Level of Consciousness: drowsy  Airway & Oxygen Therapy: Patient Spontanous Breathing  Post-op Assessment: Report given to RN and Post -op Vital signs reviewed and stable  Post vital signs: Reviewed and stable  Last Vitals:  Vitals Value Taken Time  BP    Temp    Pulse    Resp    SpO2      Last Pain:  Vitals:   08/28/24 0908  TempSrc:   PainSc: 0-No pain      Patients Stated Pain Goal: 7 (08/28/24 0715)  Complications: There were no known notable events for this encounter.

## 2024-08-28 NOTE — H&P (Signed)
 " Primary Care Physician:  [facility] Primary Gastroenterologist:  Dr. Cinderella  Pre-Procedure History & Physical: HPI:  Troy Dillon is a 54 y.o. male with past medical history of stage III rectal cancer diagnosed 05/2022 at [facility] health followed by LAR 01/2023 , Loop ileostomy reversal 03/2023,  follows with Oncology at [facility] and Surgery at [facility] here for evaluation of abnormal CT.   CT Chest abd pelvis w contrast 07/17/24: Postsurgical change of prior low anterior resection with reanastomosis, no new suspicious soft tissue nodularity along the suture line. 2. Similar rightward asymmetric mesorectal/presacral soft tissue measuring up to 2 cm in thickness, favored to reflect posttreatment change. 3. Right lower lobe pulmonary nodule which was new on prior examination has resolved in the interval. Stable 3 mm pulmonary nodule in the lingula and anterior left upper lobe. No new suspicious pulmonary nodules or masses. 4. Mild symmetric distal esophageal wall thickening with a prominent low paraesophageal lymph node measuring 4 mm, possibly reflecting esophagitis but nonspecific. Consider further evaluation by upper endoscopy. 5. Mild symmetric wall thickening of the urinary bladder, which may reflect cystitis. Correlate with urinalysis.  Past Medical History:  Diagnosis Date   Anemia    Anxiety    Depression    GERD (gastroesophageal reflux disease)    Glaucoma    Neuromuscular disorder (HCC)    Neuropathy d/t chemo   Rectal cancer (HCC) 06/08/2022    Past Surgical History:  Procedure Laterality Date   COLONOSCOPY  06/08/2022   COLONOSCOPY WITH PROPOFOL  N/A 11/11/2023   Procedure: COLONOSCOPY WITH PROPOFOL ;  Surgeon: Cinderella Deatrice FALCON, MD;  Location: [facility];  Service: Endoscopy;  Laterality: N/A;  9:00AM;ASA 1-2   EYE SURGERY Bilateral    glaucoma surgery and cataract extraction   FLEXIBLE SIGMOIDOSCOPY N/A 02/17/2023   Procedure: FLEXIBLE  SIGMOIDOSCOPY;  Surgeon: Teresa Lonni HERO, MD;  Location: [facility];  Service: General;  Laterality: N/A;   FLEXIBLE SIGMOIDOSCOPY N/A 03/22/2023   Procedure: FLEXIBLE SIGMOIDOSCOPY;  Surgeon: Teresa Lonni HERO, MD;  Location: [facility];  Service: General;  Laterality: N/A;   FRACTURE SURGERY     Right Femur- Cables in bone remain in place, rod removed in 2000   ILEOSTOMY CLOSURE N/A 05/14/2023   Procedure: LOOP ILEOSTOMY TAKEDOWN;  Surgeon: Teresa Lonni HERO, MD;  Location: [facility];  Service: General;  Laterality: N/A;   IR IMAGING GUIDED PORT INSERTION  06/22/2022   ORTHOPEDIC SURGERY     PORT-A-CATH REMOVAL N/A 05/14/2023   Procedure: REMOVAL PORT-A-CATH;  Surgeon: Teresa Lonni HERO, MD;  Location: [facility];  Service: General;  Laterality: N/A;   XI ROBOTIC ASSISTED LOWER ANTERIOR RESECTION Bilateral 02/17/2023   Procedure: XI ROBOTIC ASSISTED LOWER ANTERIOR RESECTION, DIVERTING LOOP ILEOSTOMY, BILATERAL TAP BLOCK, TISSUE PERFUSION ASSESSMENT VIA FIREFLY INJECTION;  Surgeon: Teresa Lonni HERO, MD;  Location: [facility];  Service: General;  Laterality: Bilateral;    Prior to Admission medications  Medication Sig Start Date End Date Taking? Authorizing Provider  busPIRone (BUSPAR) 5 MG tablet Take 5 mg by mouth daily. Pt states on 12/31 that he hasn't taken this for a few days because he doesn't like the way it make him feel.   Yes [provider]  PRESCRIPTION MEDICATION Simbrinza - Prescription eye drop for glaucoma.   Yes [provider]  sertraline (ZOLOFT) 50 MG tablet Take 50 mg by mouth daily.   Yes [provider]  escitalopram  (LEXAPRO ) 10 MG tablet TAKE 1 TABLET BY MOUTH EVERY  DAY 02/22/24   Rogers Hai, MD  hydrocortisone  (ANUSOL -HC) 2.5 % rectal cream Place 1 Application rectally 2 (two) times daily. 01/18/24   Rogers Hai, MD  Multiple Vitamin (MULTIVITAMIN) tablet Take 1 tablet by mouth daily. Patient not taking: No sig reported     [provider]  pantoprazole  (PROTONIX ) 40 MG tablet Take 1 tablet (40 mg total) by mouth daily. 08/07/24   Carlan, Chelsea L, NP  sildenafil  (VIAGRA ) 25 MG tablet TAKE 1 TABLET BY MOUTH DAILY AS NEEDED FOR ERECTILE DYSFUNCTION. 06/21/24   Geofm Delon BRAVO, NP  SIMBRINZA 1-0.2 % SUSP Apply to eye. 05/03/24   [provider]    Allergies as of 08/07/2024 - Review Complete 08/07/2024  Allergen Reaction Noted   Hydrocodone Itching 09/11/2021   Latex  06/18/2022    Family History  Problem Relation Age of Onset   Cancer Mother 35 - 3       GYN malignancy details unknown, TAH/BSO   Colon cancer Father 35   Heart attack Father    Colon cancer Paternal Aunt 91   Colon cancer Paternal Grandmother 27    Social History   Socioeconomic History   Marital status: Married    Spouse name: Not on file   Number of children: Not on file   Years of education: Not on file   Highest education level: Not on file  Occupational History   Not on file  Tobacco Use   Smoking status: Former    Current packs/day: 0.00    Average packs/day: 1 pack/day for 15.0 years (15.0 ttl pk-yrs)    Types: Cigarettes    Start date: 32    Quit date: 2003    Years since quitting: 23.0    Passive exposure: Past   Smokeless tobacco: Former    Types: Snuff   Tobacco comments:    Some Dipping  Vaping Use   Vaping status: Never Used  Substance and Sexual Activity   Alcohol use: Yes    Comment: occasionally   Drug use: Never   Sexual activity: Not on file  Other Topics Concern   Not on file  Social History Narrative   Not on file   Social Drivers of Health   Tobacco Use: Medium Risk (08/28/2024)   Patient History    Smoking Tobacco Use: Former    Smokeless Tobacco Use: Former    Passive Exposure: Past  Programmer, Applications: Not on [employer] Insecurity: No Food Insecurity (05/16/2023)   Hunger Vital Sign    Worried About Running Out of Food in the Last Year: Never true     Ran Out of Food in the Last Year: Never true  Transportation Needs: No Transportation Needs (05/16/2023)   PRAPARE - Administrator, Civil Service (Medical): No    Lack of Transportation (Non-Medical): No  Physical Activity: Not on file  Stress: Not on file  Social Connections: Not on file  Intimate Partner Violence: Not At Risk (05/16/2023)   Humiliation, Afraid, Rape, and Kick questionnaire    Fear of Current or Ex-Partner: No    Emotionally Abused: No    Physically Abused: No    Sexually Abused: No  Depression (PHQ2-9): Low Risk (07/25/2024)   Depression (PHQ2-9)    PHQ-2 Score: 0  Alcohol Screen: Not on file  Housing: Patient Declined (05/16/2023)   Housing    Last Housing Risk Score: 0  Utilities: Not At Risk (05/16/2023)   AHC Utilities  Threatened with loss of utilities: No  Health Literacy: Not on file    Review of Systems: See HPI, otherwise negative ROS  Physical Exam: Vital signs in last 24 hours: Temp:  [97.9 F (36.6 C)] 97.9 F (36.6 C) (01/05 0715) Pulse Rate:  [56] 56 (01/05 0715) Resp:  [13] 13 (01/05 0715) BP: (113)/(84) 113/84 (01/05 0715) SpO2:  [97 %] 97 % (01/05 0715) Weight:  [86.2 kg] 86.2 kg (01/05 0715)   General:   Alert,  Well-developed, well-nourished, pleasant and cooperative in NAD Head:  Normocephalic and atraumatic. Eyes:  Sclera clear, no icterus.   Conjunctiva pink. Ears:  Normal auditory acuity. Nose:  No deformity, discharge,  or lesions. Msk:  Symmetrical without gross deformities. Normal posture. Extremities:  Without clubbing or edema. Neurologic:  Alert and  oriented x4;  grossly normal neurologically. Skin:  Intact without significant lesions or rashes. Psych:  Alert and cooperative. Normal mood and affect.  Impression/Plan: Troy Dillon is a 54 y.o. male with past medical history of stage III rectal cancer diagnosed 05/2022 at [facility] health followed by LAR 01/2023 , Loop ileostomy reversal 03/2023,  follows with  Oncology at [facility] and Surgery at [facility] here for evaluation of abnormal CT.  Proceed with upper endoscopy   The risks of the procedure including infection, bleed, or perforation as well as benefits, limitations, alternatives and imponderables have been reviewed with the patient. Questions have been answered. All parties agreeable.  "

## 2024-08-29 ENCOUNTER — Encounter (HOSPITAL_COMMUNITY): Payer: Self-pay | Admitting: Gastroenterology

## 2024-08-30 LAB — SURGICAL PATHOLOGY

## 2024-09-01 ENCOUNTER — Ambulatory Visit (INDEPENDENT_AMBULATORY_CARE_PROVIDER_SITE_OTHER): Payer: Self-pay | Admitting: Gastroenterology

## 2024-09-05 ENCOUNTER — Encounter: Payer: Self-pay | Admitting: Oncology

## 2024-09-05 ENCOUNTER — Other Ambulatory Visit: Payer: Self-pay | Admitting: Oncology

## 2024-09-05 DIAGNOSIS — N521 Erectile dysfunction due to diseases classified elsewhere: Secondary | ICD-10-CM

## 2024-09-05 MED ORDER — SILDENAFIL CITRATE 25 MG PO TABS: 50.0000 mg | ORAL_TABLET | ORAL | 0 refills | Status: AC | PRN

## 2024-09-05 NOTE — Progress Notes (Signed)
 Refill for Viagra  sent to pharmacy.  Will increase dose to 50 mg to use as needed given he tolerated 25 mg as needed.  Delon Hope, AGNP-C Department of Hematology/Oncology [facility] at [facility]  Phone: [phone]  09/05/2024 2:04 PM

## 2024-09-07 NOTE — Progress Notes (Signed)
 3 mth EGD noted in recall Patient result letter mailed procedure note and pathology result faxed to PCP

## 2024-09-14 NOTE — Telephone Encounter (Signed)
 Patient will be added to April recall

## 2024-09-20 ENCOUNTER — Other Ambulatory Visit: Payer: Self-pay | Admitting: Oncology

## 2024-09-20 ENCOUNTER — Other Ambulatory Visit: Payer: Self-pay | Admitting: *Deleted

## 2024-09-20 ENCOUNTER — Encounter: Payer: Self-pay | Admitting: Oncology

## 2024-09-20 ENCOUNTER — Telehealth: Payer: Self-pay | Admitting: *Deleted

## 2024-09-20 MED ORDER — TRIAMCINOLONE ACETONIDE 0.025 % EX OINT: 1.0000 | TOPICAL_OINTMENT | Freq: Two times a day (BID) | CUTANEOUS | 0 refills | Status: AC

## 2024-09-20 MED ORDER — DIPHENOXYLATE-ATROPINE 2.5-0.025 MG PO TABS: 1.0000 | ORAL_TABLET | Freq: Four times a day (QID) | ORAL | 0 refills | Status: DC | PRN

## 2024-09-20 NOTE — Telephone Encounter (Signed)
 Hey I know in the past he has tried Anusol  for rectal pain.  He could also try Aquaphor as that will not burn and we could also try a low potency steroid cream.  He needs to be advised to use Aquaphor first and steroid cream later because if it is extremely excoriated from wiping, it will definitely burn.  I will send the cream over now.  He can get Aquaphor over-the-counter.  Delon Hope, AGNP-C Department of Hematology/Oncology [facility] at [facility]  Phone: [phone]  09/20/2024 9:40 AM

## 2024-09-20 NOTE — Progress Notes (Signed)
 Re:  rectal pain  Patient complaining of rectal pain secondary to frequent diarrhea.  We discussed trying Aquaphor as he has been using Anusol  which has been ineffective.  Previously, patient was prescribed Lomotil  which was more effective than Imodium .  Will send new prescription for Lomotil  to use as needed 4 times daily to control his diarrhea.  If this is persistent, would recommend he come in for stool sample so we can evaluate for infection..  Will also send in low-dose potency steroids to use for excoriation after he tries the Aquaphor.  Delon Hope, AGNP-C Department of Hematology/Oncology [facility] at [facility]  Phone: [phone]  09/20/2024 2:59 PM

## 2024-09-20 NOTE — Telephone Encounter (Signed)
 Patient has had ongoing history of frequent loose stools since radiation treatments.  States that he began having urgent loose stools up to 6 times a day since the weekend.  Hydrating and eating well.  Denies fever.  States they are not liquid in nature and does not have foul odor.  Lomotil  sent in per Delon Hope, NP-C.  Patient to report if consistency or frequency worsens.  Advised she would recommend stool samples for C-Diff or other GI pathogens at that point.  Verbalized understanding.

## 2024-09-20 NOTE — Telephone Encounter (Signed)
 What recommendations would you have for him at this point?

## 2024-09-28 ENCOUNTER — Other Ambulatory Visit: Payer: Self-pay | Admitting: *Deleted

## 2024-09-28 MED ORDER — DIPHENOXYLATE-ATROPINE 2.5-0.025 MG PO TABS: 1.0000 | ORAL_TABLET | Freq: Four times a day (QID) | ORAL | 0 refills | Status: AC | PRN

## 2024-10-24 ENCOUNTER — Inpatient Hospital Stay: Attending: Hematology

## 2024-10-31 ENCOUNTER — Inpatient Hospital Stay: Admitting: Oncology
